# Patient Record
Sex: Female | Born: 1950 | Race: White | Hispanic: No | Smoking: Never smoker
Health system: Southern US, Community
[De-identification: ages and names within clinical notes are randomized; demographics above are authoritative.]

## PROBLEM LIST (undated history)

## (undated) DIAGNOSIS — H269 Unspecified cataract: Secondary | ICD-10-CM

## (undated) DIAGNOSIS — I7121 Aneurysm of the ascending aorta, without rupture: Secondary | ICD-10-CM

## (undated) DIAGNOSIS — K829 Disease of gallbladder, unspecified: Secondary | ICD-10-CM

## (undated) DIAGNOSIS — L650 Telogen effluvium: Secondary | ICD-10-CM

## (undated) DIAGNOSIS — M1990 Unspecified osteoarthritis, unspecified site: Secondary | ICD-10-CM

## (undated) DIAGNOSIS — Q796 Ehlers-Danlos syndrome, unspecified: Secondary | ICD-10-CM

## (undated) DIAGNOSIS — T7840XA Allergy, unspecified, initial encounter: Secondary | ICD-10-CM

## (undated) DIAGNOSIS — M6740 Ganglion, unspecified site: Secondary | ICD-10-CM

## (undated) DIAGNOSIS — E079 Disorder of thyroid, unspecified: Secondary | ICD-10-CM

## (undated) DIAGNOSIS — I720 Aneurysm of carotid artery: Secondary | ICD-10-CM

## (undated) DIAGNOSIS — G4730 Sleep apnea, unspecified: Secondary | ICD-10-CM

## (undated) DIAGNOSIS — M545 Low back pain: Secondary | ICD-10-CM

## (undated) DIAGNOSIS — M810 Age-related osteoporosis without current pathological fracture: Secondary | ICD-10-CM

## (undated) DIAGNOSIS — R001 Bradycardia, unspecified: Secondary | ICD-10-CM

## (undated) DIAGNOSIS — K219 Gastro-esophageal reflux disease without esophagitis: Secondary | ICD-10-CM

## (undated) DIAGNOSIS — K635 Polyp of colon: Secondary | ICD-10-CM

## (undated) DIAGNOSIS — Z780 Asymptomatic menopausal state: Secondary | ICD-10-CM

## (undated) DIAGNOSIS — S73199A Other sprain of unspecified hip, initial encounter: Secondary | ICD-10-CM

## (undated) HISTORY — DX: Gastro-esophageal reflux disease without esophagitis: K21.9

## (undated) HISTORY — PX: NASAL SINUS SURGERY: SHX719

## (undated) HISTORY — DX: Aneurysm of the ascending aorta, without rupture: I71.21

## (undated) HISTORY — DX: Polyp of colon: K63.5

## (undated) HISTORY — DX: Asymptomatic menopausal state: Z78.0

## (undated) HISTORY — DX: Disease of gallbladder, unspecified: K82.9

## (undated) HISTORY — DX: Low back pain: M54.5

## (undated) HISTORY — DX: Allergy, unspecified, initial encounter: T78.40XA

## (undated) HISTORY — DX: Unspecified cataract: H26.9

## (undated) HISTORY — DX: Unspecified osteoarthritis, unspecified site: M19.90

## (undated) HISTORY — DX: Disorder of thyroid, unspecified: E07.9

## (undated) HISTORY — DX: Telogen effluvium: L65.0

## (undated) HISTORY — DX: Bradycardia, unspecified: R00.1

## (undated) HISTORY — PX: JOINT REPLACEMENT: SHX530

## (undated) HISTORY — DX: Age-related osteoporosis without current pathological fracture: M81.0

## (undated) HISTORY — DX: Ehlers-Danlos syndrome, unspecified: Q79.60

## (undated) HISTORY — DX: Aneurysm of carotid artery: I72.0

## (undated) HISTORY — DX: Sleep apnea, unspecified: G47.30

---
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
## 1997-04-24 HISTORY — PX: CYSTECTOMY: SUR359

## 1998-07-19 ENCOUNTER — Other Ambulatory Visit: Admission: RE | Admit: 1998-07-19 | Discharge: 1998-07-19 | Payer: Self-pay | Admitting: Obstetrics and Gynecology

## 1999-01-24 ENCOUNTER — Other Ambulatory Visit: Admission: RE | Admit: 1999-01-24 | Discharge: 1999-01-24 | Payer: Self-pay | Admitting: Obstetrics and Gynecology

## 1999-07-25 ENCOUNTER — Other Ambulatory Visit: Admission: RE | Admit: 1999-07-25 | Discharge: 1999-07-25 | Payer: Self-pay | Admitting: *Deleted

## 2000-02-02 ENCOUNTER — Other Ambulatory Visit: Admission: RE | Admit: 2000-02-02 | Discharge: 2000-02-02 | Payer: Self-pay | Admitting: Obstetrics and Gynecology

## 2000-04-24 HISTORY — PX: BREAST SURGERY: SHX581

## 2000-05-31 ENCOUNTER — Encounter: Admission: RE | Admit: 2000-05-31 | Discharge: 2000-05-31 | Payer: Self-pay | Admitting: Otolaryngology

## 2000-05-31 ENCOUNTER — Encounter: Payer: Self-pay | Admitting: Otolaryngology

## 2000-06-07 ENCOUNTER — Other Ambulatory Visit: Admission: RE | Admit: 2000-06-07 | Discharge: 2000-06-07 | Payer: Self-pay | Admitting: Otolaryngology

## 2000-06-07 ENCOUNTER — Encounter (INDEPENDENT_AMBULATORY_CARE_PROVIDER_SITE_OTHER): Payer: Self-pay | Admitting: Specialist

## 2000-09-10 ENCOUNTER — Other Ambulatory Visit: Admission: RE | Admit: 2000-09-10 | Discharge: 2000-09-10 | Payer: Self-pay | Admitting: Obstetrics and Gynecology

## 2001-01-10 ENCOUNTER — Other Ambulatory Visit: Admission: RE | Admit: 2001-01-10 | Discharge: 2001-01-10 | Payer: Self-pay | Admitting: Radiology

## 2001-01-29 ENCOUNTER — Ambulatory Visit (HOSPITAL_COMMUNITY): Admission: RE | Admit: 2001-01-29 | Discharge: 2001-01-29 | Payer: Self-pay | Admitting: Gastroenterology

## 2001-02-15 ENCOUNTER — Encounter (INDEPENDENT_AMBULATORY_CARE_PROVIDER_SITE_OTHER): Payer: Self-pay | Admitting: *Deleted

## 2001-02-15 ENCOUNTER — Ambulatory Visit (HOSPITAL_BASED_OUTPATIENT_CLINIC_OR_DEPARTMENT_OTHER): Admission: RE | Admit: 2001-02-15 | Discharge: 2001-02-15 | Payer: Self-pay | Admitting: Surgery

## 2002-02-07 ENCOUNTER — Other Ambulatory Visit: Admission: RE | Admit: 2002-02-07 | Discharge: 2002-02-07 | Payer: Self-pay | Admitting: Obstetrics & Gynecology

## 2003-03-10 ENCOUNTER — Other Ambulatory Visit: Admission: RE | Admit: 2003-03-10 | Discharge: 2003-03-10 | Payer: Self-pay | Admitting: Obstetrics & Gynecology

## 2003-05-13 ENCOUNTER — Encounter: Admission: RE | Admit: 2003-05-13 | Discharge: 2003-08-11 | Payer: Self-pay | Admitting: Family Medicine

## 2007-05-08 ENCOUNTER — Other Ambulatory Visit: Admission: RE | Admit: 2007-05-08 | Discharge: 2007-05-08 | Payer: Self-pay | Admitting: Radiology

## 2008-12-21 ENCOUNTER — Encounter: Admission: RE | Admit: 2008-12-21 | Discharge: 2008-12-21 | Payer: Self-pay | Admitting: Family Medicine

## 2009-02-08 ENCOUNTER — Encounter: Admission: RE | Admit: 2009-02-08 | Discharge: 2009-02-08 | Payer: Self-pay | Admitting: Gastroenterology

## 2009-05-26 ENCOUNTER — Encounter: Admission: RE | Admit: 2009-05-26 | Discharge: 2009-05-26 | Payer: Self-pay | Admitting: Gastroenterology

## 2009-06-08 ENCOUNTER — Encounter: Admission: RE | Admit: 2009-06-08 | Discharge: 2009-06-08 | Payer: Self-pay | Admitting: Gastroenterology

## 2009-10-11 ENCOUNTER — Encounter: Admission: RE | Admit: 2009-10-11 | Discharge: 2010-01-06 | Payer: Self-pay | Admitting: Sports Medicine

## 2010-05-15 ENCOUNTER — Encounter: Payer: Self-pay | Admitting: Obstetrics and Gynecology

## 2010-05-15 ENCOUNTER — Encounter: Payer: Self-pay | Admitting: Gastroenterology

## 2010-05-16 ENCOUNTER — Encounter: Payer: Self-pay | Admitting: Family Medicine

## 2010-05-16 ENCOUNTER — Encounter: Payer: Self-pay | Admitting: Gastroenterology

## 2010-09-09 NOTE — Op Note (Signed)
[place]. [facility]  Patient:    Patricia Conley, Patricia Conley Visit Number: [id number] MRN: [id number]          Service Type: DSU Location: [facility] Attending Physician:  Charlton Haws Dictated by:   Currie Paris, M.D. Proc. Date: 02/15/01 Admit Date:  02/15/2001 Discharge Date: 02/15/2001   CC:         Jeralyn Ruths, M.D.  Sherry A. Rosalio Macadamia, M.D.   Operative Report  VISIT:  [id number]  OFFICE MEDICAL RECORD NUMBER:  [id number]  PREOPERATIVE DIAGNOSIS:  Left breast mass.  POSTOPERATIVE DIAGNOSIS:  Left breast mass.  OPERATION:  Removal of left breast mass.  SURGEON:  Currie Paris, M.D.  ANESTHESIA:  MAC.  CLINICAL HISTORY:  This patient has presented with a palpable left breast mass, and an FNA showed scant cells, although clinically it was benign.  We felt this was inadequate for a full diagnosis, and she desired to have this removed.  DESCRIPTION OF PROCEDURE:  The patient was seen in the holding area.  He had no further questions.  In the operating room, the mass was identified and marked.  She was then given IV sedation.  The breast was prepped and draped. 1% Xylocaine was used for local and infiltrated over the mass.  A curvilinear incision was made just below the mass, trying to keep the scar a little closer to the nipple areolar complex. With some palpation and manipulation, I was able to pull the mass down and then using cutting current of the cautery began to divide some of the fatty tissue above it.  I put a suture in it for traction and then came completely around the mass, excising it in toto.  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  The incision was checked for hemostasis; when everything was dry, closed in layers with 3-0 Vicryl, followed by 4-0 Monocryl subcuticular plus Steri-Strips.  The patient tolerated the procedure well.  There were no operative complications.  All counts were correct at the end of the  case. Dictated by:   Currie Paris, M.D. Attending Physician:  Charlton Haws DD:  02/15/01 TD:  02/18/01 Job: [id number]

## 2010-11-10 ENCOUNTER — Other Ambulatory Visit: Payer: Self-pay | Admitting: Otolaryngology

## 2010-11-10 ENCOUNTER — Ambulatory Visit: Payer: Self-pay | Admitting: Internal Medicine

## 2010-11-10 DIAGNOSIS — M542 Cervicalgia: Secondary | ICD-10-CM

## 2010-11-11 ENCOUNTER — Ambulatory Visit
Admission: RE | Admit: 2010-11-11 | Discharge: 2010-11-11 | Disposition: A | Discharge status: Home / Self Care | Payer: BC Managed Care – PPO | Source: Ambulatory Visit | Attending: Otolaryngology | Admitting: Otolaryngology

## 2010-11-11 DIAGNOSIS — M542 Cervicalgia: Secondary | ICD-10-CM

## 2010-11-11 MED ORDER — IOHEXOL 300 MG/ML  SOLN
75.0000 mL | Freq: Once | INTRAMUSCULAR | Status: AC | PRN
  Administered 2010-11-11: 75 mL via INTRAVENOUS

## 2010-11-16 ENCOUNTER — Ambulatory Visit (INDEPENDENT_AMBULATORY_CARE_PROVIDER_SITE_OTHER): Payer: BC Managed Care – PPO | Admitting: Internal Medicine

## 2010-11-16 ENCOUNTER — Encounter: Payer: Self-pay | Admitting: Internal Medicine

## 2010-11-16 ENCOUNTER — Other Ambulatory Visit: Payer: Self-pay | Admitting: Internal Medicine

## 2010-11-16 VITALS — BP 110/70 | HR 76 | Temp 97.2°F | Ht 66.5 in | Wt 139.0 lb

## 2010-11-16 DIAGNOSIS — R9389 Abnormal findings on diagnostic imaging of other specified body structures: Secondary | ICD-10-CM

## 2010-11-16 DIAGNOSIS — K829 Disease of gallbladder, unspecified: Secondary | ICD-10-CM | POA: Insufficient documentation

## 2010-11-16 DIAGNOSIS — K644 Residual hemorrhoidal skin tags: Secondary | ICD-10-CM

## 2010-11-16 DIAGNOSIS — N6009 Solitary cyst of unspecified breast: Secondary | ICD-10-CM | POA: Insufficient documentation

## 2010-11-16 DIAGNOSIS — M899 Disorder of bone, unspecified: Secondary | ICD-10-CM

## 2010-11-16 DIAGNOSIS — N951 Menopausal and female climacteric states: Secondary | ICD-10-CM

## 2010-11-16 DIAGNOSIS — R61 Generalized hyperhidrosis: Secondary | ICD-10-CM

## 2010-11-16 DIAGNOSIS — Z780 Asymptomatic menopausal state: Secondary | ICD-10-CM | POA: Insufficient documentation

## 2010-11-16 DIAGNOSIS — M8580 Other specified disorders of bone density and structure, unspecified site: Secondary | ICD-10-CM | POA: Insufficient documentation

## 2010-11-16 LAB — CBC WITH DIFFERENTIAL/PLATELET
Eosinophils Absolute: 0.1 10*3/uL (ref 0.0–0.7)
HCT: 40.1 % (ref 36.0–46.0)
Hemoglobin: 13.1 g/dL (ref 12.0–15.0)
Lymphocytes Relative: 31 % (ref 12–46)
Lymphs Abs: 1.7 10*3/uL (ref 0.7–4.0)
MCH: 29.1 pg (ref 26.0–34.0)
Monocytes Absolute: 0.5 10*3/uL (ref 0.1–1.0)
Neutro Abs: 3.2 10*3/uL (ref 1.7–7.7)

## 2010-11-16 LAB — COMPREHENSIVE METABOLIC PANEL
ALT: 18 U/L (ref 0–35)
AST: 22 U/L (ref 0–37)
Albumin: 4.8 g/dL (ref 3.5–5.2)
Alkaline Phosphatase: 66 U/L (ref 39–117)
BUN: 14 mg/dL (ref 6–23)
Calcium: 9.5 mg/dL (ref 8.4–10.5)
Chloride: 102 mEq/L (ref 96–112)
Total Bilirubin: 0.9 mg/dL (ref 0.3–1.2)
Total Protein: 6.9 g/dL (ref 6.0–8.3)

## 2010-11-16 LAB — TSH: TSH: 1.275 u[IU]/mL (ref 0.350–4.500)

## 2010-11-16 LAB — VITAMIN B12: Vitamin B-12: 827 pg/mL (ref 211–911)

## 2010-11-16 NOTE — Progress Notes (Signed)
Subjective:    Patient ID: Patricia Conley, female    DOB: [birth year], 60 y.o.   MRN: [id number]  HPI  New pt here for first visit.  I spent 45 minutes with this pt.  No primary care except [facility].  Concerned over hemorrhoid she can feel.  No bleeding no pain.   Also has questions about Premarin she wass taking for hot sweats prscribed by Dr. Hal Hope.  She has not had any Progesterone in the last month.  She is not sure Premarin is helping the sweating issue  Has been evaluated in past for a muscle twitching problem with hyper-reflexia.  Evaluated at [facility] but not sure of diagnosis.  Tx with Celexa and all symtoms resolved.   She is off Celexa now  Also been told by Dr. Kinnie Scales that she had a "spot on Kidney"  Evaluated by Dr. Patsi Sears and a urologist at [facility].  She reports that latest U/S showed no spot on her kidney.  She also believes she has a GB dysfunction  She report her daughter came home from [place] recently and had some sort of virus.  She is wondering if the lymph nodes in her neck are swollen.  She had Recent ENT eval by Dr. Lazarus Salines  Appetitie OK no recent wt loss, occ sweating, No N/V?D no GI bleed or dark stools  No Known Allergies Past Medical History  Diagnosis Date  . Menopause   . Osteopenia   . Benign breast cyst in female     L side s/p bx  . Gall bladder disease     per pt report   Past Surgical History  Procedure Date  . Breast surgery 2002    cyst  . Nasal sinus surgery 1995, 2002   History   Social History  . Marital Status: Single    Spouse Name: N/A    Number of Children: N/A  . Years of Education: N/A   Occupational History  . Not on file.   Social History Main Topics  . Smoking status: Never Smoker   . Smokeless tobacco: Never Used  . Alcohol Use: 1.0 oz/week    2 drink(s) per week  . Drug Use: No  . Sexually Active: Not Currently   Other Topics Concern  . Not on file   Social History Narrative    . No narrative on file   Family History  Problem Relation Age of Onset  . Stroke Mother   . Nephrolithiasis Mother   . Diabetes Mother   . Aneurysm Mother     x2  . Cancer Mother     lymphoma, breast cancer  . Depression Father   . Parkinsonism Father   . Drug abuse Father   . Mental illness Sister     paranoia   Patient Active Problem List  Diagnoses  . Benign breast cyst in female  . Menopause  . Osteopenia  . Gall bladder disease       Review of Systems  See HPI     Objective:   Physical Exam  Constitutional: She appears well-developed and well-nourished.  Neck: No thyromegaly present.  Cardiovascular: Normal heart sounds.  Exam reveals no gallop and no friction rub.   No murmur heard. Pulmonary/Chest: Breath sounds normal. She has no wheezes. She has no rales. She exhibits no tenderness.  Genitourinary:       Hemorrhoidal tag externally  Lymphadenopathy:       Right cervical: No superficial cervical  and no posterior cervical adenopathy present.      Left cervical: No superficial cervical and no posterior cervical adenopathy present.       Right axillary: No pectoral and no lateral adenopathy present.       Left axillary: No pectoral and no lateral adenopathy present.      Right: No inguinal adenopathy present.       Left: No inguinal adenopathy present.  Skin: No rash noted.  Psychiatric: Her speech is normal and behavior is normal. Her mood appears anxious.          Assessment & Plan:  1)  Hemorrhoid  Will refer to Gen surgery 2)  I do not detect lymphadenopathy on exam  Will need Dr. Milta Deiters records 3)  Sweating.  Not clearly menopause related advised to come off premarin.  Ok to take 10 day course of Provera.  May need endometiral U/S  Given infor on hormone therapy.  Check labs today  4) Osteopenia 5)  Cystic Breast disease 6)  ????Kidney abnormal X-ray  Will get Dr. Wyatt Portela records

## 2010-11-16 NOTE — Patient Instructions (Signed)
Ok to taper off Premarin  Schedule CPE

## 2010-11-17 ENCOUNTER — Encounter: Payer: Self-pay | Admitting: Emergency Medicine

## 2010-11-22 ENCOUNTER — Ambulatory Visit: Payer: BC Managed Care – PPO | Admitting: Internal Medicine

## 2010-12-05 ENCOUNTER — Ambulatory Visit: Payer: BC Managed Care – PPO | Admitting: Internal Medicine

## 2010-12-09 ENCOUNTER — Encounter (INDEPENDENT_AMBULATORY_CARE_PROVIDER_SITE_OTHER): Payer: Self-pay | Admitting: General Surgery

## 2010-12-12 ENCOUNTER — Encounter (INDEPENDENT_AMBULATORY_CARE_PROVIDER_SITE_OTHER): Payer: BC Managed Care – PPO | Admitting: General Surgery

## 2010-12-15 ENCOUNTER — Encounter: Payer: Self-pay | Admitting: Internal Medicine

## 2010-12-15 ENCOUNTER — Ambulatory Visit (INDEPENDENT_AMBULATORY_CARE_PROVIDER_SITE_OTHER): Payer: BC Managed Care – PPO | Admitting: Internal Medicine

## 2010-12-15 VITALS — BP 110/84 | HR 74 | Temp 97.7°F | Resp 14 | Ht 66.5 in | Wt 138.0 lb

## 2010-12-15 DIAGNOSIS — N950 Postmenopausal bleeding: Secondary | ICD-10-CM

## 2010-12-15 DIAGNOSIS — D3500 Benign neoplasm of unspecified adrenal gland: Secondary | ICD-10-CM

## 2010-12-15 DIAGNOSIS — E785 Hyperlipidemia, unspecified: Secondary | ICD-10-CM

## 2010-12-15 DIAGNOSIS — Z1272 Encounter for screening for malignant neoplasm of vagina: Secondary | ICD-10-CM

## 2010-12-15 DIAGNOSIS — M899 Disorder of bone, unspecified: Secondary | ICD-10-CM

## 2010-12-15 DIAGNOSIS — S40029A Contusion of unspecified upper arm, initial encounter: Secondary | ICD-10-CM

## 2010-12-15 DIAGNOSIS — D1803 Hemangioma of intra-abdominal structures: Secondary | ICD-10-CM

## 2010-12-15 DIAGNOSIS — Z01419 Encounter for gynecological examination (general) (routine) without abnormal findings: Secondary | ICD-10-CM

## 2010-12-15 DIAGNOSIS — M949 Disorder of cartilage, unspecified: Secondary | ICD-10-CM

## 2010-12-15 DIAGNOSIS — Q619 Cystic kidney disease, unspecified: Secondary | ICD-10-CM

## 2010-12-15 DIAGNOSIS — M25559 Pain in unspecified hip: Secondary | ICD-10-CM

## 2010-12-15 DIAGNOSIS — N281 Cyst of kidney, acquired: Secondary | ICD-10-CM

## 2010-12-15 DIAGNOSIS — M8580 Other specified disorders of bone density and structure, unspecified site: Secondary | ICD-10-CM

## 2010-12-15 DIAGNOSIS — Z113 Encounter for screening for infections with a predominantly sexual mode of transmission: Secondary | ICD-10-CM

## 2010-12-15 DIAGNOSIS — M25552 Pain in left hip: Secondary | ICD-10-CM

## 2010-12-15 LAB — POCT URINALYSIS DIPSTICK
Bilirubin, UA: NEGATIVE
Glucose, UA: NEGATIVE
Ketones, UA: NEGATIVE
Protein, UA: NEGATIVE

## 2010-12-15 LAB — LIPID PANEL
Total CHOL/HDL Ratio: 2 Ratio
Triglycerides: 74 mg/dL (ref <150)

## 2010-12-15 NOTE — Progress Notes (Signed)
Subjective:    Patient ID: Patricia Conley, female    DOB: 01/04/1951, 60 y.o.   MRN: [id number]  HPI  Patricia Conley is here for CPE.  She his stressed because her daughter has an undiagnosed illness that involves her lymph nodes.  Patricia Conley is tearful when speaking about this during interiew.    She also has a recurrence of her L hip pain .  She describes achy pain in L hip and inguinal area that has been treated by an orhtopedist in the past and she has an appt. This afternoon  Also concerned about small bruises that she sees on inside of arms and legs.  NO bleeding from gums  She is UTD on her mammogram, last bone density showed osteopenia,  And she is UTD on colonoscopy with Dr. Madilyn Fireman.  She reports about 5-6 month of unopposed estrogen.  I had given her a Provera withdrawal trial and pt. Reports she had no menstrual bleeding.    Review of Systems No chest pain, no SOB,  No LE Edema.    Objective:   Physical Exam Physical Exam  Vital signs and nursing note reviewed  Constitutional: She is oriented to person, place, and time. She appears well-developed and well-nourished. She is cooperative.  HENT:  Head: Normocephalic and atraumatic.  Right Ear: Tympanic membrane normal.  Left Ear: Tympanic membrane normal.  Nose: Nose normal.  Mouth/Throat: Oropharynx is clear and moist and mucous membranes are normal. No oropharyngeal exudate or posterior oropharyngeal erythema.  Eyes: Conjunctivae and EOM are normal. Pupils are equal, round, and reactive to light.  Neck: Neck supple. No JVD present. Carotid bruit is not present. No mass and no thyromegaly present.  Cardiovascular: Regular rhythm, normal heart sounds, intact distal pulses and normal pulses.  Exam reveals no gallop and no friction rub.   No murmur heard. Pulses:      Dorsalis pedis pulses are 2+ on the right side, and 2+ on the left side.  Pulmonary/Chest: Breath sounds normal. She has no wheezes. She has no rhonchi. She has no rales.  Right breast exhibits no mass, no nipple discharge and no skin change. Left breast exhibits no mass, no nipple discharge and no skin change.  Abdominal: Soft. Bowel sounds are normal. She exhibits no distension and no mass. There is no hepatosplenomegaly. There is no tenderness. There is no CVA tenderness.  Genitourinary: Rectum normal, vagina normal and uterus normal. Rectal exam shows no mass. Guaiac negative stool. No labial fusion. There is no lesion on the right labia. There is no lesion on the left labia. Cervix exhibits no motion tenderness. Right adnexum displays no mass, no tenderness and no fullness. Left adnexum displays no mass, no tenderness and no fullness. No erythema around the vagina. Guaiac neg  Musculoskeletal:       No active synovitis to any joint.  L hip some pain with external rotation.  No SI joint pain     Lymphadenopathy:       Right cervical: No superficial cervical adenopathy present.      Left cervical: No superficial cervical adenopathy present.       Right axillary: No pectoral and no lateral adenopathy present.       Left axillary: No pectoral and no lateral adenopathy present.      Right: No inguinal adenopathy present.       Left: No inguinal adenopathy present.  Neurological: She is alert and oriented to person, place, and time. She has normal strength and  normal reflexes. No cranial nerve deficit or sensory deficit. She displays a negative Romberg sign. Coordination and gait normal.  Skin: Skin is warm and dry. No abrasion, no bruising, and no rash noted. No cyanosis. Nails show no clubbing.   Mm sized bruise medial aspect of R upper arm Psychiatric: She has a normal mood and affect. Her speech is normal and behavior is normal.          Assessment & Plan:  1)  HM  See scanned hm sheet.  Pt is to call dR. Madilyn Fireman to see when she is due for a repeat colonoscopy 2)  L HIp pain  OK to reafer to [facility]ul[facility] Foster after she sees her orthopedist 3)  Ecchymosis  Clinically  very minor  Will check PT and PTT 4)  L renal cyst :  Followed by Dr. Patsi Sears who she saw spring of 2010 for repeat u/S that pt states was done in office.  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

## 2010-12-15 NOTE — Patient Instructions (Signed)
Will set up physical therapy referral.  See orthopedist about L hip and follow recommendation when to start PT  Labs will be mailed to you.  Call in 10 days if you have not received  Call as needed

## 2010-12-16 LAB — PROTIME-INR
INR: 0.89 (ref <1.50)
Prothrombin Time: 12.4 seconds (ref 11.6–15.2)

## 2010-12-16 LAB — APTT: aPTT: 29 seconds (ref 24–37)

## 2010-12-20 ENCOUNTER — Encounter: Payer: Self-pay | Admitting: Emergency Medicine

## 2010-12-22 ENCOUNTER — Encounter: Payer: Self-pay | Admitting: Emergency Medicine

## 2011-01-03 ENCOUNTER — Encounter: Payer: Self-pay | Admitting: Internal Medicine

## 2011-01-04 ENCOUNTER — Encounter: Payer: Self-pay | Admitting: Internal Medicine

## 2011-01-04 ENCOUNTER — Ambulatory Visit (INDEPENDENT_AMBULATORY_CARE_PROVIDER_SITE_OTHER): Payer: BC Managed Care – PPO | Admitting: Internal Medicine

## 2011-01-04 VITALS — BP 124/80 | HR 76 | Temp 97.2°F | Wt 137.0 lb

## 2011-01-04 DIAGNOSIS — M25559 Pain in unspecified hip: Secondary | ICD-10-CM

## 2011-01-04 DIAGNOSIS — K219 Gastro-esophageal reflux disease without esophagitis: Secondary | ICD-10-CM

## 2011-01-04 DIAGNOSIS — R1310 Dysphagia, unspecified: Secondary | ICD-10-CM

## 2011-01-04 MED ORDER — OMEPRAZOLE-SODIUM BICARBONATE 40-1100 MG PO CAPS: 1.0000 | ORAL_CAPSULE | Freq: Every day | ORAL | Status: DC

## 2011-01-04 NOTE — Patient Instructions (Signed)
Take prescribed medicine daily for 2 months  Will follow here if not better and pt to make appt with Dr. Jennye Boroughs office

## 2011-01-04 NOTE — Progress Notes (Signed)
Subjective:    Patient ID: Patricia Conley, female    DOB: 1951/01/23, 60 y.o.   MRN: [id number]  HPICarol is here for an acute visit.  She has had 3 weeks of burning type upper chest and throat pain.  States at times she can feel the "acid back up into my throat"  Eating Tums without relief.  No change in color or dark stools.  No V/D occasional nausea.  Has been trying to eat less spicy foods.  She does report that at her last EGD approx 21 year ago Patricia Conley mentitoned she may a warty leasion in her esophagus.  Occasionally she feels as if she has a hard time swallowing.  She had been taking some intermittant Celebrex for her L hip pain.  She has been referred to a specialist by her orthopedic MD as she has a L torn Labrum  She is going fo her TVUS soon.  She had to reschedule appt.  Daughter is doing better so some less stresss she reports  No Known Allergies Past Medical History  Diagnosis Date  . Menopause   . Osteopenia   . Benign breast cyst in female     L side s/p bx  . Gall bladder disease     per pt report  . GERD (gastroesophageal reflux disease)    Past Surgical History  Procedure Date  . Breast surgery 2002    cyst  . Nasal sinus surgery 1995, 2002   History   Social History  . Marital Status: Single    Spouse Name: Patricia Conley    Number of Children: Patricia Conley  . Years of Education: Patricia Conley   Occupational History  . Not on file.   Social History Main Topics  . Smoking status: Never Smoker   . Smokeless tobacco: Never Used  . Alcohol Use: 1.0 oz/week    2 drink(s) per week  . Drug Use: No  . Sexually Active: Not Currently   Other Topics Concern  . Not on file   Social History Narrative  . No narrative on file   Family History  Problem Relation Age of Onset  . Stroke Mother   . Nephrolithiasis Mother   . Diabetes Mother   . Aneurysm Mother     x2  . Cancer Mother     lymphoma, breast cancer  . Depression Father   . Parkinsonism Father   . Drug abuse Father     . Mental illness Sister     paranoia   Patient Active Problem List  Diagnoses  . Benign breast cyst in female  . Menopause  . Osteopenia  . Gall bladder disease  . Renal cyst  . Adrenal nodule  . Liver hemangioma   Current Outpatient Prescriptions on File Prior to Visit  Medication Sig Dispense Refill  . b complex vitamins capsule Take 1 capsule by mouth daily.        Marland Kitchen estrogens, conjugated, (PREMARIN) 0.3 MG tablet Take 0.3 mg by mouth daily. Take daily for 21 days then do not take for 7 days.              Review of Systems See HPI.  No chest or epigastric pain.  No SOB    Objective:   Physical Exam Physical Exam  Nursing note and vitals reviewed.  Constitutional: She is oriented to person, place, and time. She appears well-developed and well-nourished.  HENT:  Head: Normocephalic and atraumatic.  Cardiovascular: Normal rate and regular rhythm. Exam  reveals no gallop and no friction rub.  No murmur heard.  Pulmonary/Chest: Breath sounds normal. She has no wheezes. She has no rales. ABD.  BS +  NO HSM.  No tenderness is episgastrium or in any other quadrant.  No peritoneal signs  Neurological: She is alert and oriented to person, place, and time.  Skin: Skin is warm and dry.  Psychiatric: She has a normal mood and affect. Her behavior is normal.       Assessment & Plan:  1)  Dyspepsia most likey GERD.  Will try 8 week sof Zegerid 40 mg.  Avoid spicy foods. 2)  ??? Esophageal lesion:  Pt advised to follow up with Patricial Medoff esp. If difficulty swallowing continues.  She voices understanding and will make appt with his office.  She is to return here if no improvement

## 2011-01-10 ENCOUNTER — Other Ambulatory Visit (HOSPITAL_BASED_OUTPATIENT_CLINIC_OR_DEPARTMENT_OTHER): Payer: BC Managed Care – PPO

## 2011-01-12 ENCOUNTER — Ambulatory Visit (INDEPENDENT_AMBULATORY_CARE_PROVIDER_SITE_OTHER)
Admission: RE | Admit: 2011-01-12 | Discharge: 2011-01-12 | Disposition: A | Discharge status: Home / Self Care | Payer: BC Managed Care – PPO | Source: Ambulatory Visit | Attending: Internal Medicine | Admitting: Internal Medicine

## 2011-01-12 ENCOUNTER — Ambulatory Visit (HOSPITAL_BASED_OUTPATIENT_CLINIC_OR_DEPARTMENT_OTHER)
Admission: RE | Admit: 2011-01-12 | Discharge: 2011-01-12 | Disposition: A | Discharge status: Home / Self Care | Payer: BC Managed Care – PPO | Source: Ambulatory Visit | Attending: Internal Medicine | Admitting: Internal Medicine

## 2011-01-12 DIAGNOSIS — N950 Postmenopausal bleeding: Secondary | ICD-10-CM

## 2011-01-18 ENCOUNTER — Telehealth: Payer: Self-pay | Admitting: Emergency Medicine

## 2011-01-18 NOTE — Telephone Encounter (Signed)
Patricia Conley saw Dr. Tora Duck with [facility] yesterday about her hip pain.  She states he talked about doing a hip replacement.  She said she was not prepared to think about that and would like to discuss with DDS and possibly get a second opinion.  Would like to know if DDS would call her to discuss?  Not sure where she needs to go or what to do? Best contact number is cell phone [phone]

## 2011-02-06 ENCOUNTER — Encounter: Payer: Self-pay | Admitting: Internal Medicine

## 2011-02-08 NOTE — Telephone Encounter (Signed)
I have attempted to call pts severtal times and have left messages on her cell phone

## 2011-02-15 ENCOUNTER — Telehealth: Payer: Self-pay | Admitting: Internal Medicine

## 2011-02-15 NOTE — Telephone Encounter (Signed)
Spoke with Patricia Conley Tuesday 02/14/2011.  She wishes second opinion regarding Dr. Tana Felts recommendation for hip replacment.  OK to refer to Dr. Lamar Sprinkles at [facility]

## 2011-03-14 DIAGNOSIS — M169 Osteoarthritis of hip, unspecified: Secondary | ICD-10-CM | POA: Insufficient documentation

## 2011-04-07 ENCOUNTER — Encounter: Payer: Self-pay | Admitting: Internal Medicine

## 2011-07-18 ENCOUNTER — Encounter (INDEPENDENT_AMBULATORY_CARE_PROVIDER_SITE_OTHER): Payer: BC Managed Care – PPO | Admitting: General Surgery

## 2011-08-18 ENCOUNTER — Encounter (INDEPENDENT_AMBULATORY_CARE_PROVIDER_SITE_OTHER): Payer: Self-pay | Admitting: General Surgery

## 2011-08-18 ENCOUNTER — Ambulatory Visit (INDEPENDENT_AMBULATORY_CARE_PROVIDER_SITE_OTHER): Payer: BC Managed Care – PPO | Admitting: General Surgery

## 2011-08-18 VITALS — BP 112/80 | HR 72 | Temp 98.3°F | Resp 18 | Ht 67.0 in | Wt 135.2 lb

## 2011-08-18 DIAGNOSIS — K644 Residual hemorrhoidal skin tags: Secondary | ICD-10-CM

## 2011-08-18 NOTE — Progress Notes (Signed)
Patient ID: Patricia Conley, female   DOB: Sep 08, 1950, 61 y.o.   MRN: [id number]  Chief Complaint  Patient presents with  . Hemorrhoids    recheck    HPI Patricia Conley is a 61 y.o. female.   HPI She is referred by Dr. Constance Goltz for evaluation of hemorrhoids. She states she is felt an area that appears to be protruding out from her anus for a number of years. Intermittently, it is uncomfortable and itches a little bit. No bleeding or pain with bowel movements. She does not have any constipation. Her last colonoscopy was 10-12 years ago.  Past Medical History  Diagnosis Date  . Menopause   . Osteopenia   . Benign breast cyst in female     L side s/p bx  . Gall bladder disease     per pt report  . GERD (gastroesophageal reflux disease)   . Hemorrhoids     Past Surgical History  Procedure Date  . Nasal sinus surgery 1995, 2002  . Breast surgery 2002    cyst on Left breast  . Cystectomy 1999    Right elbow    Family History  Problem Relation Age of Onset  . Stroke Mother   . Nephrolithiasis Mother   . Diabetes Mother   . Aneurysm Mother     x2  . Cancer Mother     lymphoma, breast cancer  . Depression Father   . Parkinsonism Father   . Drug abuse Father   . Mental illness Sister     paranoia    Social History History  Substance Use Topics  . Smoking status: Never Smoker   . Smokeless tobacco: Never Used  . Alcohol Use: 1.0 oz/week    2 drink(s) per week    No Known Allergies  Current Outpatient Prescriptions  Medication Sig Dispense Refill  . b complex vitamins capsule Take 1 capsule by mouth daily.        . Glucos-MSM-C-Mn-Ginger-Willow (BL MSM GLUCOSAMINE COMPLEX PO) Take by mouth daily.        Review of Systems Review of Systems  Constitutional: Negative.   Gastrointestinal: Positive for rectal pain. Negative for abdominal pain, blood in stool and anal bleeding.  Musculoskeletal: Positive for arthralgias (left hip).    Blood pressure 112/80,  pulse 72, temperature 98.3 F (36.8 C), temperature source Temporal, resp. rate 18, height 5\' 7"  (1.702 m), weight 135 lb 3.2 oz (61.326 kg).  Physical Exam Physical Exam  Constitutional: She appears well-developed and well-nourished. No distress.  Abdominal: Soft. She exhibits no distension and no mass. There is no tenderness.       Subumbilical scar.  Genitourinary:       External hemorrhoid in the right anterior position.  No suspicious perianal skin lesions.  No anal fissure.  On DRE, there is no mass or blood.  Anoscopy-no masses or blood;  Small to moderate size right anterior internal hemorroid.    Data Reviewed None  Assessment    Minimally symptomatic right anterior external hemorrhoid.  Asx right internal hemorrhoid.    Plan    Avoid constipation.  Use TUCKS pads prn.  Call Dr. Kinnie Scales re needing a screening colonoscopy.  Return is sxs worsen.       Fedra Lanter J 08/18/2011, 10:06 AM

## 2011-08-18 NOTE — Patient Instructions (Signed)
Avoid constipation 

## 2011-08-31 ENCOUNTER — Encounter (HOSPITAL_COMMUNITY): Payer: Self-pay | Admitting: Pharmacy Technician

## 2011-09-05 ENCOUNTER — Encounter: Payer: Self-pay | Admitting: Internal Medicine

## 2011-09-05 ENCOUNTER — Ambulatory Visit (INDEPENDENT_AMBULATORY_CARE_PROVIDER_SITE_OTHER): Payer: BC Managed Care – PPO | Admitting: Internal Medicine

## 2011-09-05 VITALS — BP 110/70 | HR 56 | Temp 98.3°F | Ht 66.5 in | Wt 133.8 lb

## 2011-09-05 DIAGNOSIS — J329 Chronic sinusitis, unspecified: Secondary | ICD-10-CM

## 2011-09-05 DIAGNOSIS — Z01818 Encounter for other preprocedural examination: Secondary | ICD-10-CM

## 2011-09-05 LAB — COMPREHENSIVE METABOLIC PANEL
Alkaline Phosphatase: 64 U/L (ref 39–117)
BUN: 9 mg/dL (ref 6–23)
Glucose, Bld: 79 mg/dL (ref 70–99)
Sodium: 142 mEq/L (ref 135–145)
Total Bilirubin: 0.8 mg/dL (ref 0.3–1.2)
Total Protein: 7.1 g/dL (ref 6.0–8.3)

## 2011-09-05 LAB — CBC WITH DIFFERENTIAL/PLATELET
Basophils Absolute: 0 10*3/uL (ref 0.0–0.1)
Basophils Relative: 0 % (ref 0–1)
Eosinophils Absolute: 0.1 10*3/uL (ref 0.0–0.7)
Hemoglobin: 13.6 g/dL (ref 12.0–15.0)
MCH: 28.9 pg (ref 26.0–34.0)
MCHC: 32.5 g/dL (ref 30.0–36.0)
Monocytes Relative: 6 % (ref 3–12)
Neutrophils Relative %: 66 % (ref 43–77)
RDW: 12.8 % (ref 11.5–15.5)

## 2011-09-05 LAB — LIPID PANEL
HDL: 60 mg/dL (ref >39)
LDL Cholesterol: 60 mg/dL (ref 0–99)
Triglycerides: 67 mg/dL (ref <150)
VLDL: 13 mg/dL (ref 0–40)

## 2011-09-05 MED ORDER — AZITHROMYCIN 250 MG PO TABS
ORAL_TABLET | ORAL | Status: AC
Start: 2011-09-05 — End: 2011-09-10

## 2011-09-05 NOTE — Progress Notes (Signed)
Here to get medical clearance to have hip surgey. Also c/o may have sinus infection, had bad headache yesterday in frontal area, has bad headache/pressure today also with no relief with excedrin

## 2011-09-05 NOTE — Progress Notes (Signed)
Subjective:    Patient ID: Patricia Conley, female    DOB: 1951-01-20, 61 y.o.   MRN: [id number]  HPI  Patricia Conley is here for pre-op clearance for upcoming hip replacement by Dr. Charlann Boxer.  She is doing well except for sinus congestion and headache that she believes may be related to sinuses.   She has had previous sinus surgery by Dr. Lazarus Salines.  She has nasal steroid spray but not been using this  No Known Allergies Past Medical History  Diagnosis Date  . Menopause   . Osteopenia   . Benign breast cyst in female     L side s/p bx  . Gall bladder disease     per pt report  . GERD (gastroesophageal reflux disease)   . Hemorrhoids    Past Surgical History  Procedure Date  . Nasal sinus surgery 1995, 2002  . Breast surgery 2002    cyst on Left breast  . Cystectomy 1999    Right elbow   History   Social History  . Marital Status: Single    Spouse Name: N/A    Number of Children: N/A  . Years of Education: N/A   Occupational History  . Not on file.   Social History Main Topics  . Smoking status: Never Smoker   . Smokeless tobacco: Never Used  . Alcohol Use: 1.0 oz/week    2 drink(s) per week  . Drug Use: No  . Sexually Active: Not Currently   Other Topics Concern  . Not on file   Social History Narrative  . No narrative on file   Family History  Problem Relation Age of Onset  . Stroke Mother   . Nephrolithiasis Mother   . Diabetes Mother   . Aneurysm Mother     x2  . Cancer Mother     lymphoma, breast cancer  . Depression Father   . Parkinsonism Father   . Drug abuse Father   . Mental illness Sister     paranoia   Patient Active Problem List  Diagnoses  . Benign breast cyst in female  . Menopause  . Osteopenia  . Gall bladder disease  . Renal cyst  . Adrenal nodule  . Liver hemangioma  . Hemorrhoids, external without complications   Current Outpatient Prescriptions on File Prior to Visit  Medication Sig Dispense Refill  . b complex vitamins capsule  Take 1 capsule by mouth daily.        . celecoxib (CELEBREX) 100 MG capsule Take 100 mg by mouth daily.       . Glucos-MSM-C-Mn-Ginger-Willow (BL MSM GLUCOSAMINE COMPLEX PO) Take by mouth daily.      . Multiple Vitamin (MULITIVITAMIN WITH MINERALS) TABS Take 1 tablet by mouth daily with breakfast.            Review of Systems    see HPI Objective:   Physical Exam  Physical Exam  Constitutional: She is oriented to person, place, and time. She appears well-developed and well-nourished. She is cooperative.  HENT:  Head: Normocephalic and atraumatic.  Right Ear: A middle ear effusion is present.  Left Ear: A middle ear effusion is present.  Nose: Mucosal edema present. Right sinus exhibits maxillary sinus tenderness. Left sinus exhibits maxillary sinus tenderness.  Mouth/Throat: Posterior oropharyngeal erythema present.  Serous effusion bilaterally  Eyes: Conjunctivae and EOM are normal. Pupils are equal, round, and reactive to light.  Neck: Neck supple. Carotid bruit is not present. No mass present.  Cardiovascular: Regular  rhythm, normal heart sounds, intact distal pulses and normal pulses. Exam reveals no gallop and no friction rub.  No murmur heard.  Pulmonary/Chest: Breath sounds normal. She has no wheezes. She has no rhonchi. She has no rales.  Neurological: She is alert and oriented to person, place, and time.  Skin: Skin is warm and dry. No abrasion, no bruising, no ecchymosis and no rash noted. No cyanosis. Nails show no clubbing.  Psychiatric: She has a normal mood and affect. Her speech is normal and behavior is normal.  Ext no edema        Assessment & Plan:  1)  Sinusitis   z-pak   Nasal steroid ok for 5-7 days 2)  Pre-op clearance  Had recent EKG  Will get labs today  Once sinusitis treated OK for pre-op clearance  Advised no asa or nsaid products one week prior to surgery

## 2011-09-06 ENCOUNTER — Telehealth: Payer: Self-pay | Admitting: *Deleted

## 2011-09-06 NOTE — Telephone Encounter (Signed)
Copy of labs mailed to pt's home address and copy faxed to Dr Charlann Boxer ortho.

## 2011-09-06 NOTE — Progress Notes (Signed)
H&P performed 09/06/11 Dictation # [id number]

## 2011-09-07 ENCOUNTER — Encounter (HOSPITAL_COMMUNITY): Payer: Self-pay

## 2011-09-07 ENCOUNTER — Encounter (HOSPITAL_COMMUNITY)
Admission: RE | Admit: 2011-09-07 | Discharge: 2011-09-07 | Disposition: A | Discharge status: Home / Self Care | Payer: BC Managed Care – PPO | Source: Ambulatory Visit | Attending: Orthopedic Surgery | Admitting: Orthopedic Surgery

## 2011-09-07 HISTORY — DX: Unspecified osteoarthritis, unspecified site: M19.90

## 2011-09-07 LAB — CBC
MCH: 28.7 pg (ref 26.0–34.0)
MCHC: 32.6 g/dL (ref 30.0–36.0)
Platelets: 270 10*3/uL (ref 150–400)
RDW: 12.7 % (ref 11.5–15.5)

## 2011-09-07 LAB — DIFFERENTIAL
Basophils Relative: 1 % (ref 0–1)
Eosinophils Absolute: 0 10*3/uL (ref 0.0–0.7)
Neutrophils Relative %: 78 % — ABNORMAL HIGH (ref 43–77)

## 2011-09-07 LAB — PROTIME-INR
INR: 0.96 (ref 0.00–1.49)
Prothrombin Time: 13 seconds (ref 11.6–15.2)

## 2011-09-07 LAB — URINALYSIS, ROUTINE W REFLEX MICROSCOPIC
Bilirubin Urine: NEGATIVE
Ketones, ur: NEGATIVE mg/dL
Nitrite: NEGATIVE
Urobilinogen, UA: 0.2 mg/dL (ref 0.0–1.0)
pH: 6.5 (ref 5.0–8.0)

## 2011-09-07 LAB — BASIC METABOLIC PANEL
BUN: 13 mg/dL (ref 6–23)
Calcium: 9.8 mg/dL (ref 8.4–10.5)
GFR calc non Af Amer: >90 mL/min (ref >90)
Glucose, Bld: 71 mg/dL (ref 70–99)

## 2011-09-07 LAB — URINE MICROSCOPIC-ADD ON

## 2011-09-07 NOTE — Patient Instructions (Addendum)
[address]  09/07/2011   Your procedure is scheduled on:  09-12-2011 Tuesday  Report to [facility] at 1100  AM.  Call this number if you have problems the morning of surgery: [phone]   Remember:   Do not eat food:After Midnight.              Clear liquids midnight until 0730 am, then nothing by mouth .  Take these medicines the morning of surgery with A SIP OF WATER: no meds to take   Do not wear jewelry or make up.  Do not wear lotions, powders, or perfumes.Do not wear deodorant.    Do not bring valuables to the hospital.  Contacts, dentures or bridgework may not be worn into surgery.  Leave suitcase in the car. After surgery it may be brought to your room.  For patients admitted to the hospital, checkout time is 11:00 AM the day of discharge.     Special Instructions: CHG Shower Use Special Wash: 1/2 bottle night before surgery and 1/2 bottle morning of surgery use regular soap on face and front and back private area, do not shave for 2 days before showers.   Please read over the following fact sheets that you were given: MRSA Information, blood fact sheet, incentive spirimeter fact sheet.  Cain Sieve [facility] pre op nurse phone number [phone], call if needed

## 2011-09-07 NOTE — Pre-Procedure Instructions (Signed)
Note to check micro ua results in epic sent to dr Charlann Boxer. Fax cobnfirmation received and placed in chart. Medical clearance note dr Constance Goltz on chart

## 2011-09-08 NOTE — H&P (Signed)
Patricia Conley, Patricia Conley               ACCOUNT NO.:  [id number]  MEDICAL RECORD NO.:  [id number]  LOCATION:                               FACILITY:  [facility]  PHYSICIAN:  Madlyn Frankel. Charlann Boxer, M.D.  DATE OF BIRTH:  Jul 31, 1950  DATE OF ADMISSION: DATE OF DISCHARGE:                             HISTORY & PHYSICAL   ADMITTING DIAGNOSIS:  End-stage osteoarthritis, left hip.  HISTORY OF PRESENT ILLNESS:  This is a 61 year old lady with a history of end-stage osteoarthritis of her left hip which has failed conservative treatment.  After discussion of treatments, benefits, risks, and options, the patient is now scheduled for total hip arthroplasty by anterior approach, left hip.  Note that she is a candidate for tranexamic acid and will receive that at surgery as well.  She is a candidate for dexamethasone and will receive that.  She is planning on going home after surgery.  She was given her home medications today of aspirin, Robaxin, iron, MiraLax, and Colace.  PAST MEDICAL HISTORY:  Drug allergies none.  CURRENT MEDICATIONS:  Celebrex 200 mg 1 daily.  PREVIOUS SURGERIES:  Include sinus surgery and breast cyst excision.  SERIOUS MEDICAL ILLNESSES:  Include reflux.  FAMILY HISTORY:  Positive for Parkinson's, stroke, cancer, and diabetes.  SOCIAL HISTORY:  The patient is divorced.  She is an Airline pilot.  She does not smoke and drinks 3-4 drinks per month.  Again she is planning on going home after surgery.  REVIEW OF SYSTEMS:  CENTRAL NERVOUS SYSTEM:  Negative for headache, blurred vision, or dizziness.  PULMONARY:  Negative shortness of breath, PND, and orthopnea.  CARDIOVASCULAR:  Negative chest pain, palpitation. GI:  Negative for ulcers or hepatitis, but positive for reflux.  GU: Negative for urinary tract difficulty.  MUSCULOSKELETAL:  Positive as in HPI.  PHYSICAL EXAMINATION:  GENERAL:  This is a well-developed, well- nourished lady, in no acute distress. VITAL SIGNS:  Show a blood  pressure of 119/80, pulse 80 and regular, respirations 14. HEENT:  Head normocephalic.  Nose patent.  Ears patent.  Pupils equal, round, and react to light.  Throat without injection. NECK:  Supple without adenopathy.  Carotids 2+ without bruit. CHEST:  Clear auscultation.  No rales or rhonchi.  Respirations 14. HEART:  Regular rate and rhythm at 80 beats per minute without murmur. ABDOMEN:  Soft with active bowel sounds.  No masses or organomegaly. NEUROLOGIC:  The patient alert and oriented to time, place, and person. Cranial nerves II through XII grossly intact. EXTREMITIES:  Shows left hip with painful range of motion. Neurovascular status intact.  ASSESSMENT:  End-stage osteoarthritis, left hip.  PLAN:  Left hip total hip arthroplasty by anterior approach.     Jaquelyn Bitter. Atonya Templer, P.A.   ______________________________ Madlyn Frankel Charlann Boxer, M.D.    SJC/MEDQ  D:  09/06/2011  T:  09/08/2011  Job:  [id number]

## 2011-09-11 NOTE — Pre-Procedure Instructions (Signed)
Spoke with pt, pt aware surgery time changed to 1510, clear liquids midnight until 0900am, then nothing by mouth, pt aware arrive 1230pm, wl Rafuse stay.

## 2011-09-12 ENCOUNTER — Ambulatory Visit (HOSPITAL_COMMUNITY): Payer: BC Managed Care – PPO

## 2011-09-12 ENCOUNTER — Inpatient Hospital Stay (HOSPITAL_COMMUNITY)
Admission: RE | Admit: 2011-09-12 | Discharge: 2011-09-14 | DRG: 818 | Disposition: A | Discharge status: Home Health | Payer: BC Managed Care – PPO | Source: Ambulatory Visit | Attending: Orthopedic Surgery | Admitting: Orthopedic Surgery

## 2011-09-12 ENCOUNTER — Encounter (HOSPITAL_COMMUNITY): Payer: Self-pay | Admitting: *Deleted

## 2011-09-12 ENCOUNTER — Inpatient Hospital Stay (HOSPITAL_COMMUNITY): Payer: BC Managed Care – PPO

## 2011-09-12 ENCOUNTER — Encounter (HOSPITAL_COMMUNITY)
Admission: RE | Disposition: A | Discharge status: Home Health | Payer: Self-pay | Source: Ambulatory Visit | Attending: Orthopedic Surgery

## 2011-09-12 ENCOUNTER — Ambulatory Visit (HOSPITAL_COMMUNITY): Payer: BC Managed Care – PPO | Admitting: *Deleted

## 2011-09-12 DIAGNOSIS — Z96649 Presence of unspecified artificial hip joint: Secondary | ICD-10-CM

## 2011-09-12 DIAGNOSIS — K219 Gastro-esophageal reflux disease without esophagitis: Secondary | ICD-10-CM | POA: Diagnosis present

## 2011-09-12 DIAGNOSIS — M1610 Unilateral primary osteoarthritis, unspecified hip: Principal | ICD-10-CM | POA: Diagnosis present

## 2011-09-12 DIAGNOSIS — M169 Osteoarthritis of hip, unspecified: Principal | ICD-10-CM | POA: Diagnosis present

## 2011-09-12 DIAGNOSIS — Z01812 Encounter for preprocedural laboratory examination: Secondary | ICD-10-CM

## 2011-09-12 DIAGNOSIS — Z79899 Other long term (current) drug therapy: Secondary | ICD-10-CM

## 2011-09-12 HISTORY — PX: TOTAL HIP ARTHROPLASTY: SHX124

## 2011-09-12 LAB — TYPE AND SCREEN
ABO/RH(D): O POS
Antibody Screen: NEGATIVE

## 2011-09-12 LAB — ABO/RH: ABO/RH(D): O POS

## 2011-09-12 SURGERY — ARTHROPLASTY, HIP, TOTAL, ANTERIOR APPROACH
Anesthesia: General | Site: Hip | Laterality: Left | Wound class: Clean

## 2011-09-12 MED ORDER — ACETAMINOPHEN 10 MG/ML IV SOLN
INTRAVENOUS | Status: DC | PRN
  Administered 2011-09-12: 1000 mg via INTRAVENOUS

## 2011-09-12 MED ORDER — CELECOXIB 100 MG PO CAPS
100.0000 mg | ORAL_CAPSULE | Freq: Every day | ORAL | Status: DC
  Filled 2011-09-12 (×3): qty 1

## 2011-09-12 MED ORDER — FERROUS SULFATE 325 (65 FE) MG PO TABS
325.0000 mg | ORAL_TABLET | Freq: Three times a day (TID) | ORAL | Status: DC
  Administered 2011-09-13 – 2011-09-14 (×3): 325 mg via ORAL
  Filled 2011-09-12 (×8): qty 1

## 2011-09-12 MED ORDER — ALUM & MAG HYDROXIDE-SIMETH 200-200-20 MG/5ML PO SUSP: 30.0000 mL | ORAL | Status: DC | PRN

## 2011-09-12 MED ORDER — KETAMINE HCL 10 MG/ML IJ SOLN
INTRAMUSCULAR | Status: DC | PRN
  Administered 2011-09-12 (×2): 10 mg via INTRAVENOUS
  Administered 2011-09-12: 20 mg via INTRAVENOUS
  Administered 2011-09-12: 10 mg via INTRAVENOUS

## 2011-09-12 MED ORDER — METOCLOPRAMIDE HCL 10 MG PO TABS: 5.0000 mg | ORAL_TABLET | Freq: Three times a day (TID) | ORAL | Status: DC | PRN

## 2011-09-12 MED ORDER — DEXAMETHASONE SODIUM PHOSPHATE 10 MG/ML IJ SOLN: 10.0000 mg | Freq: Once | INTRAMUSCULAR | Status: DC

## 2011-09-12 MED ORDER — RIVAROXABAN 10 MG PO TABS
10.0000 mg | ORAL_TABLET | ORAL | Status: DC
  Administered 2011-09-13 – 2011-09-14 (×2): 10 mg via ORAL
  Filled 2011-09-12 (×2): qty 1

## 2011-09-12 MED ORDER — FENTANYL CITRATE 0.05 MG/ML IJ SOLN
INTRAMUSCULAR | Status: DC | PRN
  Administered 2011-09-12 (×8): 50 ug via INTRAVENOUS

## 2011-09-12 MED ORDER — BISACODYL 5 MG PO TBEC: 5.0000 mg | DELAYED_RELEASE_TABLET | Freq: Every day | ORAL | Status: DC | PRN

## 2011-09-12 MED ORDER — DOCUSATE SODIUM 100 MG PO CAPS
100.0000 mg | ORAL_CAPSULE | Freq: Two times a day (BID) | ORAL | Status: DC
  Administered 2011-09-13 – 2011-09-14 (×3): 100 mg via ORAL

## 2011-09-12 MED ORDER — ZOLPIDEM TARTRATE 5 MG PO TABS: 5.0000 mg | ORAL_TABLET | Freq: Every evening | ORAL | Status: DC | PRN

## 2011-09-12 MED ORDER — METHOCARBAMOL 100 MG/ML IJ SOLN
500.0000 mg | Freq: Four times a day (QID) | INTRAVENOUS | Status: DC | PRN
  Administered 2011-09-12 – 2011-09-13 (×3): 500 mg via INTRAVENOUS
  Filled 2011-09-12 (×4): qty 5

## 2011-09-12 MED ORDER — HYDROMORPHONE HCL PF 1 MG/ML IJ SOLN
0.2500 mg | INTRAMUSCULAR | Status: DC | PRN
  Administered 2011-09-12 (×4): 0.5 mg via INTRAVENOUS

## 2011-09-12 MED ORDER — SODIUM CHLORIDE 0.9 % IV SOLN
100.0000 mL/h | INTRAVENOUS | Status: AC
  Administered 2011-09-12: 100 mL/h via INTRAVENOUS
  Filled 2011-09-12 (×3): qty 1000

## 2011-09-12 MED ORDER — POLYETHYLENE GLYCOL 3350 17 G PO PACK
17.0000 g | PACK | Freq: Two times a day (BID) | ORAL | Status: DC
  Administered 2011-09-13 – 2011-09-14 (×3): 17 g via ORAL

## 2011-09-12 MED ORDER — HYDROMORPHONE HCL PF 1 MG/ML IJ SOLN
INTRAMUSCULAR | Status: AC
  Filled 2011-09-12: qty 1

## 2011-09-12 MED ORDER — ACETAMINOPHEN 10 MG/ML IV SOLN
INTRAVENOUS | Status: AC
  Filled 2011-09-12: qty 100

## 2011-09-12 MED ORDER — METHOCARBAMOL 500 MG PO TABS
500.0000 mg | ORAL_TABLET | Freq: Four times a day (QID) | ORAL | Status: DC | PRN
  Administered 2011-09-13 – 2011-09-14 (×2): 500 mg via ORAL
  Filled 2011-09-12 (×2): qty 1

## 2011-09-12 MED ORDER — CEFAZOLIN SODIUM 1-5 GM-% IV SOLN
1.0000 g | INTRAVENOUS | Status: AC
  Administered 2011-09-12: 1 g via INTRAVENOUS

## 2011-09-12 MED ORDER — ONDANSETRON HCL 4 MG PO TABS: 4.0000 mg | ORAL_TABLET | Freq: Four times a day (QID) | ORAL | Status: DC | PRN

## 2011-09-12 MED ORDER — NEOSTIGMINE METHYLSULFATE 1 MG/ML IJ SOLN
INTRAMUSCULAR | Status: DC | PRN
  Administered 2011-09-12 (×2): 2 mg via INTRAVENOUS

## 2011-09-12 MED ORDER — LACTATED RINGERS IV SOLN
INTRAVENOUS | Status: DC | PRN
  Administered 2011-09-12 (×3): via INTRAVENOUS

## 2011-09-12 MED ORDER — PROPOFOL 10 MG/ML IV EMUL
INTRAVENOUS | Status: DC | PRN
  Administered 2011-09-12: 100 mg via INTRAVENOUS

## 2011-09-12 MED ORDER — PHENYLEPHRINE HCL 10 MG/ML IJ SOLN
INTRAMUSCULAR | Status: DC | PRN
  Administered 2011-09-12: 80 ug via INTRAVENOUS
  Administered 2011-09-12: 120 ug via INTRAVENOUS
  Administered 2011-09-12: 80 ug via INTRAVENOUS
  Administered 2011-09-12 (×2): 120 ug via INTRAVENOUS
  Administered 2011-09-12: 80 ug via INTRAVENOUS

## 2011-09-12 MED ORDER — ROCURONIUM BROMIDE 100 MG/10ML IV SOLN
INTRAVENOUS | Status: DC | PRN
  Administered 2011-09-12: 20 mg via INTRAVENOUS
  Administered 2011-09-12: 10 mg via INTRAVENOUS
  Administered 2011-09-12: 30 mg via INTRAVENOUS

## 2011-09-12 MED ORDER — HETASTARCH-ELECTROLYTES 6 % IV SOLN
INTRAVENOUS | Status: DC | PRN
  Administered 2011-09-12: 17:00:00 via INTRAVENOUS

## 2011-09-12 MED ORDER — PHENOL 1.4 % MT LIQD
1.0000 | OROMUCOSAL | Status: DC | PRN
  Filled 2011-09-12: qty 177

## 2011-09-12 MED ORDER — ONDANSETRON HCL 4 MG/2ML IJ SOLN
4.0000 mg | Freq: Four times a day (QID) | INTRAMUSCULAR | Status: DC | PRN
  Administered 2011-09-13: 4 mg via INTRAVENOUS
  Filled 2011-09-12: qty 2

## 2011-09-12 MED ORDER — HYDROCODONE-ACETAMINOPHEN 7.5-325 MG PO TABS
1.0000 | ORAL_TABLET | ORAL | Status: DC
  Administered 2011-09-13 (×3): 2 via ORAL
  Administered 2011-09-13 (×2): 1 via ORAL
  Administered 2011-09-14: 2 via ORAL
  Administered 2011-09-14: 1 via ORAL
  Administered 2011-09-14: 2 via ORAL
  Filled 2011-09-12: qty 2
  Filled 2011-09-12: qty 1
  Filled 2011-09-12: qty 2
  Filled 2011-09-12: qty 1
  Filled 2011-09-12 (×3): qty 2
  Filled 2011-09-12: qty 1

## 2011-09-12 MED ORDER — CEFAZOLIN SODIUM 1-5 GM-% IV SOLN
1.0000 g | Freq: Four times a day (QID) | INTRAVENOUS | Status: AC
  Administered 2011-09-12 – 2011-09-13 (×3): 1 g via INTRAVENOUS
  Filled 2011-09-12 (×3): qty 50

## 2011-09-12 MED ORDER — DEXAMETHASONE SODIUM PHOSPHATE 4 MG/ML IJ SOLN
INTRAMUSCULAR | Status: DC | PRN
  Administered 2011-09-12: 10 mg via INTRAVENOUS

## 2011-09-12 MED ORDER — MIDAZOLAM HCL 5 MG/5ML IJ SOLN
INTRAMUSCULAR | Status: DC | PRN
  Administered 2011-09-12: 2 mg via INTRAVENOUS

## 2011-09-12 MED ORDER — METOCLOPRAMIDE HCL 5 MG/ML IJ SOLN
5.0000 mg | Freq: Three times a day (TID) | INTRAMUSCULAR | Status: DC | PRN
  Administered 2011-09-12: 10 mg via INTRAVENOUS
  Filled 2011-09-12: qty 2

## 2011-09-12 MED ORDER — FLEET ENEMA 7-19 GM/118ML RE ENEM: 1.0000 | ENEMA | Freq: Once | RECTAL | Status: AC | PRN

## 2011-09-12 MED ORDER — HYDROMORPHONE HCL PF 1 MG/ML IJ SOLN: 0.5000 mg | INTRAMUSCULAR | Status: DC | PRN

## 2011-09-12 MED ORDER — ONDANSETRON HCL 4 MG/2ML IJ SOLN
INTRAMUSCULAR | Status: DC | PRN
  Administered 2011-09-12: 4 mg via INTRAVENOUS

## 2011-09-12 MED ORDER — PROMETHAZINE HCL 25 MG/ML IJ SOLN: 6.2500 mg | INTRAMUSCULAR | Status: DC | PRN

## 2011-09-12 MED ORDER — CEFAZOLIN SODIUM 1-5 GM-% IV SOLN
INTRAVENOUS | Status: AC
  Filled 2011-09-12: qty 50

## 2011-09-12 MED ORDER — DEXAMETHASONE SODIUM PHOSPHATE 10 MG/ML IJ SOLN
10.0000 mg | Freq: Once | INTRAMUSCULAR | Status: AC
  Administered 2011-09-13: 10 mg via INTRAVENOUS
  Filled 2011-09-12: qty 1

## 2011-09-12 MED ORDER — LIDOCAINE HCL (CARDIAC) 20 MG/ML IV SOLN
INTRAVENOUS | Status: DC | PRN
  Administered 2011-09-12: 50 mg via INTRAVENOUS

## 2011-09-12 MED ORDER — MENTHOL 3 MG MT LOZG
1.0000 | LOZENGE | OROMUCOSAL | Status: DC | PRN
  Filled 2011-09-12: qty 9

## 2011-09-12 MED ORDER — LACTATED RINGERS IV SOLN
INTRAVENOUS | Status: DC
  Administered 2011-09-12: 1000 mL via INTRAVENOUS

## 2011-09-12 MED ORDER — GLYCOPYRROLATE 0.2 MG/ML IJ SOLN
INTRAMUSCULAR | Status: DC | PRN
  Administered 2011-09-12 (×2): 0.2 mg via INTRAVENOUS

## 2011-09-12 MED ORDER — TRANEXAMIC ACID 100 MG/ML IV SOLN
15.0000 mg/kg | Freq: Once | INTRAVENOUS | Status: DC
  Filled 2011-09-12: qty 9.03

## 2011-09-12 MED ORDER — DIPHENHYDRAMINE HCL 25 MG PO CAPS: 25.0000 mg | ORAL_CAPSULE | Freq: Four times a day (QID) | ORAL | Status: DC | PRN

## 2011-09-12 MED ORDER — 0.9 % SODIUM CHLORIDE (POUR BTL) OPTIME
TOPICAL | Status: DC | PRN
  Administered 2011-09-12: 1000 mL

## 2011-09-12 SURGICAL SUPPLY — 39 items
BAG ZIPLOCK 12X15 (MISCELLANEOUS) ×4 IMPLANT
BLADE SAW SGTL 18X1.27X75 (BLADE) ×2 IMPLANT
CELLS DAT CNTRL 66122 CELL SVR (MISCELLANEOUS) IMPLANT
CLOTH BEACON ORANGE TIMEOUT ST (SAFETY) ×2 IMPLANT
DERMABOND ADVANCED (GAUZE/BANDAGES/DRESSINGS) ×1
DERMABOND ADVANCED .7 DNX12 (GAUZE/BANDAGES/DRESSINGS) ×1 IMPLANT
DRAPE C-ARM 42X72 X-RAY (DRAPES) ×2 IMPLANT
DRAPE STERI IOBAN 125X83 (DRAPES) ×2 IMPLANT
DRAPE U-SHAPE 47X51 STRL (DRAPES) ×6 IMPLANT
DRSG AQUACEL AG ADV 3.5X10 (GAUZE/BANDAGES/DRESSINGS) ×2 IMPLANT
DRSG TEGADERM 4X4.75 (GAUZE/BANDAGES/DRESSINGS) ×4 IMPLANT
DURAPREP 26ML APPLICATOR (WOUND CARE) ×2 IMPLANT
ELECT BLADE TIP CTD 4 INCH (ELECTRODE) ×2 IMPLANT
ELECT REM PT RETURN 9FT ADLT (ELECTROSURGICAL) ×2
ELECTRODE REM PT RTRN 9FT ADLT (ELECTROSURGICAL) ×1 IMPLANT
EVACUATOR 1/8 PVC DRAIN (DRAIN) ×2 IMPLANT
FACESHIELD LNG OPTICON STERILE (SAFETY) ×8 IMPLANT
GAUZE SPONGE 2X2 8PLY STRL LF (GAUZE/BANDAGES/DRESSINGS) ×1 IMPLANT
GLOVE BIOGEL PI IND STRL 7.5 (GLOVE) ×1 IMPLANT
GLOVE BIOGEL PI IND STRL 8 (GLOVE) ×1 IMPLANT
GLOVE BIOGEL PI INDICATOR 7.5 (GLOVE) ×1
GLOVE BIOGEL PI INDICATOR 8 (GLOVE) ×1
GLOVE ECLIPSE 8.0 STRL XLNG CF (GLOVE) ×2 IMPLANT
GLOVE ORTHO TXT STRL SZ7.5 (GLOVE) ×6 IMPLANT
GOWN BRE IMP PREV XXLGXLNG (GOWN DISPOSABLE) IMPLANT
GOWN STRL NON-REIN LRG LVL3 (GOWN DISPOSABLE) IMPLANT
KIT BASIN OR (CUSTOM PROCEDURE TRAY) ×2 IMPLANT
PACK TOTAL JOINT (CUSTOM PROCEDURE TRAY) ×2 IMPLANT
PADDING CAST COTTON 6X4 STRL (CAST SUPPLIES) ×2 IMPLANT
RTRCTR WOUND ALEXIS 18CM MED (MISCELLANEOUS)
SPONGE GAUZE 2X2 STER 10/PKG (GAUZE/BANDAGES/DRESSINGS) ×1
SUCTION FRAZIER 12FR DISP (SUCTIONS) ×2 IMPLANT
SUT MNCRL AB 4-0 PS2 18 (SUTURE) ×2 IMPLANT
SUT VIC AB 1 CT1 36 (SUTURE) ×6 IMPLANT
SUT VIC AB 2-0 CT1 27 (SUTURE) ×2
SUT VIC AB 2-0 CT1 TAPERPNT 27 (SUTURE) ×2 IMPLANT
SUT VLOC 180 0 24IN GS25 (SUTURE) ×2 IMPLANT
TOWEL OR 17X26 10 PK STRL BLUE (TOWEL DISPOSABLE) ×4 IMPLANT
TRAY FOLEY CATH 14FRSI W/METER (CATHETERS) ×2 IMPLANT

## 2011-09-12 NOTE — Transfer of Care (Signed)
Immediate Anesthesia Transfer of Care Note  Patient: Patricia Conley  Procedure(s) Performed: Procedure(s) (LRB): TOTAL HIP ARTHROPLASTY ANTERIOR APPROACH (Left)  Patient Location: PACU  Anesthesia Type: General  Level of Consciousness: awake, alert , oriented, patient cooperative and responds to stimulation  Airway & Oxygen Therapy: Patient Spontanous Breathing and Patient connected to face mask oxygen  Post-op Assessment: Report given to PACU RN, Post -op Vital signs reviewed and stable and Patient moving all extremities  Post vital signs: Reviewed and stable  Complications: No apparent anesthesia complications

## 2011-09-12 NOTE — Op Note (Signed)
NAME:  Patricia Conley                ACCOUNT NO.: [id number]      MEDICAL RECORD NO.: [id number]      FACILITY:  [facility]      PHYSICIAN:  Hewitt Garner D  DATE OF BIRTH:  11-03-1950     DATE OF PROCEDURE:  09/12/2011                                 OPERATIVE REPORT         PREOPERATIVE DIAGNOSIS: Left  hip osteoarthritis.      POSTOPERATIVE DIAGNOSIS:  Left hip osteoarthritis.      PROCEDURE:  Left total hip replacement through an anterior approach   utilizing DePuy THR system, component size 52mm pinnacle cup, a size 36+4 neutral   Altrex liner, a size 7 standard Tri Lock stem with a 36+1.5 delta ceramic   ball.      SURGEON:  Madlyn Frankel. Charlann Boxer, M.D.      ASSISTANT:  Lanney Gins, PA      ANESTHESIA:  Regional.      SPECIMENS:  None.      COMPLICATIONS:  None.      BLOOD LOSS:  900 cc     DRAINS:  One Hemovac.      INDICATION OF THE PROCEDURE:  Patricia Conley is a 61 y.o. female who had   presented to office for evaluation of left hip pain.  Radiographs revealed   progressive degenerative changes with bone-on-bone   articulation to the  hip joint.  The patient had painful limited range of   motion significantly affecting their overall quality of life.  The patient was failing to    respond to conservative measures, and at this point was ready   to proceed with more definitive measures.  The patient has noted progressive   degenerative changes in his hip, progressive problems and dysfunction   with regarding the hip prior to surgery.  Consent was obtained for   benefit of pain relief.  Specific risk of infection, DVT, component   failure, dislocation, need for revision surgery, as well discussion of   the anterior versus posterior approach were reviewed.  Consent was   obtained for benefit of anterior pain relief through an anterior   approach.      PROCEDURE IN DETAIL:  The patient was brought to operative theater.   Once adequate anesthesia, preoperative  antibiotics, 2gm Ancef administered.   The patient was positioned supine on the OSI Hanna table.  Once adequate   padding of boney process was carried out, we had predraped out the hip, and  used fluoroscopy to confirm orientation of the pelvis and position.      The left hip was then prepped and draped from proximal iliac crest to   mid thigh with shower curtain technique.      Time-out was performed identifying the patient, planned procedure, and   extremity.     An incision was then made 2 cm distal and lateral to the   anterior superior iliac spine extending over the orientation of the   tensor fascia lata muscle and sharp dissection was carried down to the   fascia of the muscle and protractor placed in the soft tissues.      The fascia was then incised.  The muscle belly was identified and swept  laterally and retractor placed along the superior neck.  Following   cauterization of the circumflex vessels and removing some pericapsular   fat, a second cobra retractor was placed on the inferior neck.  A third   retractor was placed on the anterior acetabulum after elevating the   anterior rectus.  A L-capsulotomy was along the line of the   superior neck to the trochanteric fossa, then extended proximally and   distally.  Tag sutures were placed and the retractors were then placed   intracapsular.  We then identified the trochanteric fossa and   orientation of my neck cut, confirmed this radiographically   and then made a neck osteotomy with the femur on traction.  The femoral   head was removed without difficulty or complication.  Traction was let   off and retractors were placed posterior and anterior around the   acetabulum.      The labrum and foveal tissue were debrided.  I began reaming with a 45mm   reamer and reamed up to 51mm reamer with good bony bed preparation and a 52   cup was chosen.  The final 52mm Pinnacle cup was then impacted under fluoroscopy  to confirm the  depth of penetration and orientation with respect to   abduction.  A screw was placed followed by the hole eliminator.  The final   36+4 Altrex liner was impacted with good visualized rim fit.  The cup was positioned anatomically within the acetabular portion of the pelvis.      At this point, the femur was rolled at 80 degrees.  Further capsule was   released off the inferior aspect of the femoral neck.  I then   released the superior capsule proximally.  The hook was placed laterally   along the femur and elevated manually and held in position with the bed   hook.  The leg was then extended and adducted with the leg rolled to 100   degrees of external rotation.  Once the proximal femur was fully   exposed, I used a box osteotome to set orientation.  I then began   broaching with the starting chili pepper broach and passed this by hand and then broached up to 7.  With the 7 broach in place I chose a standard neck and did a trial reduction with a 36+1.5 ball.  The offset was appropriate, leg lengths   appeared to be equal, confirmed radiographically.   Given these findings, I went ahead and dislocated the hip, repositioned all   retractors and positioned the right hip in the extended and abducted position.  The final 7 standard Tri Lock stem was   chosen and it was impacted down to the level of neck cut.  Based on this   and the trial reduction, a 36+1.5 delta ceramic ball was chosen and   impacted onto a clean and dry trunnion, and the hip was reduced.  The   hip had been irrigated throughout the case again at this point.  I did   reapproximate the superior capsular leaflet to the anterior leaflet   using #1 Vicryl, placed a medium Hemovac drain deep.  The fascia of the   tensor fascia lata muscle was then reapproximated using #1 Vicryl.  The   remaining wound was closed with 2-0 Vicryl and running 4-0 Monocryl.   The hip was cleaned, dried, and dressed sterilely using Dermabond and    Aquacel dressing.  Drain site dressed separately.  She was then brought   to recovery room in stable condition tolerating the procedure well.    Danae Orleans, PA-C was present for the entirety of the case involved from   preoperative positioning, perioperative retractor management, general   facilitation of the case, as well as primary wound closure as assistant.            Pietro Cassis Alvan Dame, M.D.            MDO/MEDQ  D:  02/14/2011  T:  02/14/2011  Job:  [id number]      Electronically Signed by Paralee Cancel M.D. on 02/20/2011 09:15:38 AM

## 2011-09-12 NOTE — Anesthesia Preprocedure Evaluation (Signed)
Anesthesia Evaluation  Patient identified by MRN, date of birth, ID band Patient awake    Reviewed: Allergy & Precautions, H&P , NPO status , Patient's Chart, lab work & pertinent test results  Airway Mallampati: II TM Distance: >3 FB Neck ROM: Full    Dental No notable dental hx.    Pulmonary neg pulmonary ROS,  breath sounds clear to auscultation  Pulmonary exam normal       Cardiovascular negative cardio ROS  Rhythm:Regular Rate:Normal     Neuro/Psych negative neurological ROS  negative psych ROS   GI/Hepatic Neg liver ROS, GERD-  ,  Endo/Other  negative endocrine ROS  Renal/GU negative Renal ROS  negative genitourinary   Musculoskeletal negative musculoskeletal ROS (+)   Abdominal   Peds negative pediatric ROS (+)  Hematology negative hematology ROS (+)   Anesthesia Other Findings   Reproductive/Obstetrics negative OB ROS                           Anesthesia Physical Anesthesia Plan  ASA: I  Anesthesia Plan: General   Post-op Pain Management:    Induction: Intravenous  Airway Management Planned: Oral ETT  Additional Equipment:   Intra-op Plan:   Post-operative Plan: Extubation in OR  Informed Consent: I have reviewed the patients History and Physical, chart, labs and discussed the procedure including the risks, benefits and alternatives for the proposed anesthesia with the patient or authorized representative who has indicated his/her understanding and acceptance.   Dental advisory given  Plan Discussed with: CRNA  Anesthesia Plan Comments: (Discussed risks/benefits general versus spinal. Prefers general.)        Anesthesia Quick Evaluation

## 2011-09-12 NOTE — Interval H&P Note (Signed)
History and Physical Interval Note:  09/12/2011 1:23 PM  Patricia Conley  has presented today for surgery, with the diagnosis of left hip osteoarthritis  The various methods of treatment have been discussed with the patient and family. After consideration of risks, benefits and other options for treatment, the patient has consented to  Procedure(s) (LRB): LEFT TOTAL HIP ARTHROPLASTY ANTERIOR APPROACH (Left) as a surgical intervention .  The patients' history has been reviewed, patient examined, no change in status, stable for surgery.  I have reviewed the patients' chart and labs.  Questions were answered to the patient's satisfaction.     [name]

## 2011-09-12 NOTE — Anesthesia Postprocedure Evaluation (Signed)
  Anesthesia Post-op Note  Patient: Patricia Conley  Procedure(s) Performed: Procedure(s) (LRB): TOTAL HIP ARTHROPLASTY ANTERIOR APPROACH (Left)  Patient Location: PACU  Anesthesia Type: General  Level of Consciousness: awake and alert   Airway and Oxygen Therapy: Patient Spontanous Breathing  Post-op Pain: mild  Post-op Assessment: Post-op Vital signs reviewed, Patient's Cardiovascular Status Stable, Respiratory Function Stable, Patent Airway and No signs of Nausea or vomiting  Post-op Vital Signs: stable  Complications: No apparent anesthesia complications

## 2011-09-12 NOTE — Anesthesia Procedure Notes (Signed)
Procedure Name: Intubation Date/Time: 09/12/2011 3:16 PM Performed by: Randon Goldsmith CATHERINE PAYNE Pre-anesthesia Checklist: Patient identified, Emergency Drugs available, Suction available and Patient being monitored Patient Re-evaluated:Patient Re-evaluated prior to inductionOxygen Delivery Method: Circle system utilized Preoxygenation: Pre-oxygenation with 100% oxygen Intubation Type: IV induction Ventilation: Mask ventilation without difficulty Laryngoscope Size: Miller and 2 Grade View: Grade I Tube type: Oral Tube size: 7.0 mm Number of attempts: 2 Airway Equipment and Method: Stylet Placement Confirmation: ETT inserted through vocal cords under direct vision,  positive ETCO2,  CO2 detector and breath sounds checked- equal and bilateral Secured at: 22 cm Tube secured with: Tape Dental Injury: Teeth and Oropharynx as per pre-operative assessment

## 2011-09-12 NOTE — Progress Notes (Signed)
Pt states she finished taking Cipro for UTI

## 2011-09-12 NOTE — Preoperative (Signed)
Beta Blockers   Reason not to administer Beta Blockers:Not Applicable, not on  Home BB 

## 2011-09-13 LAB — BASIC METABOLIC PANEL
Chloride: 106 mEq/L (ref 96–112)
Creatinine, Ser: 0.69 mg/dL (ref 0.50–1.10)
GFR calc Af Amer: >90 mL/min (ref >90)
Sodium: 138 mEq/L (ref 135–145)

## 2011-09-13 LAB — URINALYSIS, ROUTINE W REFLEX MICROSCOPIC
Leukocytes, UA: NEGATIVE
Nitrite: NEGATIVE
Specific Gravity, Urine: 1.011 (ref 1.005–1.030)
pH: 6 (ref 5.0–8.0)

## 2011-09-13 LAB — CBC
MCV: 87.6 fL (ref 78.0–100.0)
Platelets: 178 10*3/uL (ref 150–400)
RDW: 12.7 % (ref 11.5–15.5)
WBC: 7.2 10*3/uL (ref 4.0–10.5)

## 2011-09-13 LAB — URINE MICROSCOPIC-ADD ON

## 2011-09-13 MED ORDER — IBUPROFEN 600 MG PO TABS
600.0000 mg | ORAL_TABLET | Freq: Once | ORAL | Status: DC
  Filled 2011-09-13 (×2): qty 1

## 2011-09-13 NOTE — Evaluation (Signed)
Physical Therapy Evaluation Patient Details Name: Patricia Conley MRN: [id number] DOB: 12-18-1950 Today's Date: 09/13/2011 Time:  -     PT Assessment / Plan / Recommendation Clinical Impression       PT Assessment  Patient needs continued PT services    Follow Up Recommendations  Home health PT    Barriers to Discharge        lEquipment Recommendations  Rolling walker with 5" wheels    Recommendations for Other Services OT consult   Frequency 7X/week    Precautions / Restrictions Restrictions Weight Bearing Restrictions: No Other Position/Activity Restrictions: WBAT         Mobility  Bed Mobility Bed Mobility: Supine to Sit Supine to Sit: 4: Min assist Details for Bed Mobility Assistance: cues for sequence and use of UEs to self assist Transfers Transfers: Sit to Stand;Stand to Sit Sit to Stand: 4: Min assist;3: Mod assist Stand to Sit: 4: Min assist Details for Transfer Assistance: cues for use of UEs and for LE management Ambulation/Gait Ambulation/Gait Assistance: 4: Min assist;3: Mod assist Assistive device: Rolling walker Gait Pattern: Step-to pattern    Exercises Total Joint Exercises Ankle Circles/Pumps: AROM;10 reps;Supine;Both Heel Slides: AAROM;15 reps;Supine;Left Hip ABduction/ADduction: AAROM;10 reps;Supine;Left   PT Diagnosis: Difficulty walking  PT Problem List:   PT Treatment Interventions: DME instruction;Gait training;Stair training;Functional mobility training;Therapeutic activities;Therapeutic exercise;Patient/family education   PT Goals Acute Rehab PT Goals PT Goal Formulation: With patient Time For Goal Achievement: 09/19/11 Potential to Achieve Goals: Good Pt will go Supine/Side to Sit: with supervision Pt will go Sit to Supine/Side: with supervision Pt will go Sit to Stand: with supervision Pt will go Stand to Sit: with supervision Pt will Ambulate: 51 - 150 feet;with supervision;with rolling walker Pt will Go Up / Down Stairs:  1-2 stairs;with min assist;with least restrictive assistive device  Visit Information  Assistance Needed: +1    Subjective Data  Subjective: I'm afraid I am going to seize up from not moving Patient Stated Goal: Resume previous active lifestyle with decreased pain   Prior Functioning  Home Living Lives With: Daughter Available Help at Discharge: Family Type of Home: House Home Access: Stairs to enter Secretary/administrator of Steps: 1 (1+1) Entrance Stairs-Rails: None Home Layout: One level Prior Function Level of Independence: Independent Able to Take Stairs?: Yes Driving: Yes Communication Communication: No difficulties    Cognition  Overall Cognitive Status: Appears within functional limits for tasks assessed/performed Arousal/Alertness: Awake/alert Orientation Level: Appears intact for tasks assessed Behavior During Session: [facility] for tasks performed    Extremity/Trunk Assessment Right Upper Extremity Assessment RUE ROM/Strength/Tone: Within functional levels Left Upper Extremity Assessment LUE ROM/Strength/Tone: Within functional levels Right Lower Extremity Assessment RLE ROM/Strength/Tone: Within functional levels Left Lower Extremity Assessment LLE ROM/Strength/Tone: Deficits LLE ROM/Strength/Tone Deficits: AAROM TO 9- HIP FLEX AND 30 HIP ABD; Hip strength 3-/5   Balance    End of Session PT - End of Session Activity Tolerance: Other (comment) (ltd by dizziness/ decreased BP with OOB activity) Patient left: in chair;with call bell/phone within reach Nurse Communication: Mobility status;Other (comment) (pt BP)   Keiana Tavella 09/13/2011, 3:41 PM

## 2011-09-13 NOTE — Progress Notes (Signed)
Physical Therapy Treatment Patient Details Name: Patricia Conley MRN: [id number] DOB: Jul 22, 1950 Today's Date: 09/13/2011 Time: 4098-1191 PT Time Calculation (min): 23 min  PT Assessment / Plan / Recommendation Comments on Treatment Session  pt with BP sitting 92/49; ambulating 96/58.  Pt denies symptoms.  RN aware    Follow Up Recommendations  Home health PT    Barriers to Discharge        Equipment Recommendations  Rolling walker with 5" wheels    Recommendations for Other Services OT consult  Frequency 7X/week   Plan Discharge plan remains appropriate    Precautions / Restrictions Restrictions Weight Bearing Restrictions: No Other Position/Activity Restrictions: WBAT   Pertinent Vitals/Pain     Mobility  Bed Mobility Bed Mobility: Supine to Sit Supine to Sit: 4: Min assist Details for Bed Mobility Assistance: cues for sequence and use of UEs to self assist Transfers Transfers: Sit to Stand;Stand to Sit Sit to Stand: 4: Min assist Stand to Sit: 4: Min assist Details for Transfer Assistance: cues for use of UEs and for LE management Ambulation/Gait Ambulation/Gait Assistance: 4: Min assist Ambulation Distance (Feet): 134 Feet Assistive device: Rolling walker Ambulation/Gait Assistance Details: cues for posture, position from RW and sequence Gait Pattern: Step-to pattern    Exercises Total Joint Exercises Ankle Circles/Pumps: AROM;10 reps;Supine;Both Heel Slides: AAROM;15 reps;Supine;Left Hip ABduction/ADduction: AAROM;10 reps;Supine;Left   PT Diagnosis: Difficulty walking  PT Problem List:   PT Treatment Interventions: DME instruction;Gait training;Stair training;Functional mobility training;Therapeutic activities;Therapeutic exercise;Patient/family education   PT Goals Acute Rehab PT Goals PT Goal Formulation: With patient Time For Goal Achievement: 09/19/11 Potential to Achieve Goals: Good Pt will go Supine/Side to Sit: with supervision PT Goal:  Supine/Side to Sit - Progress: Goal set today Pt will go Sit to Supine/Side: with supervision PT Goal: Sit to Supine/Side - Progress: Goal set today Pt will go Sit to Stand: with supervision PT Goal: Sit to Stand - Progress: Progressing toward goal Pt will go Stand to Sit: with supervision PT Goal: Stand to Sit - Progress: Progressing toward goal Pt will Ambulate: 51 - 150 feet;with supervision;with rolling walker PT Goal: Ambulate - Progress: Progressing toward goal Pt will Go Up / Down Stairs: 1-2 stairs;with min assist;with least restrictive assistive device PT Goal: Up/Down Stairs - Progress: Goal set today  Visit Information  Last PT Received On: 09/13/11 Assistance Needed: +1    Subjective Data  Subjective: [name] Patient Stated Goal: Resume previous active lifestyle with decreased pain   Cognition  Overall Cognitive Status: Appears within functional limits for tasks assessed/performed Arousal/Alertness: Awake/alert Orientation Level: Appears intact for tasks assessed Behavior During Session: [facility] for tasks performed    Balance     End of Session PT - End of Session Activity Tolerance: Patient tolerated treatment well Patient left: in chair;with call bell/phone within reach Nurse Communication: Mobility status;Other (comment)    Hasel Janish 09/13/2011, 3:48 PM

## 2011-09-13 NOTE — Progress Notes (Signed)
Patient complained of frequent urination. Notified Dr.Olin.  Routine urinalysis to lab done.

## 2011-09-13 NOTE — Progress Notes (Signed)
Utilization review completed.  

## 2011-09-13 NOTE — Progress Notes (Signed)
Subjective: 1 Day Post-Op Procedure(s) (LRB): TOTAL HIP ARTHROPLASTY ANTERIOR APPROACH (Left)   Patient reports pain as mild, well controlled with medication. C/O muscle pain. No events throughout the night. Spent some time discussing the case, findings and expectation for the patient.   Objective:   VITALS:   Filed Vitals:   09/13/11 0623  BP: 91/57  Pulse: 79  Temp: 98.5 F (36.9 C)  Resp: 12    Neurovascular intact Dorsiflexion/Plantar flexion intact Incision: dressing C/D/I No cellulitis present Compartment soft  LABS  Basename 09/13/11 0400  HGB 8.9*  HCT 26.8*  WBC 7.2  PLT 178     Basename 09/13/11 0400  NA 138  K 4.6  BUN 10  CREATININE 0.69  GLUCOSE 141*     Assessment/Plan: 1 Day Post-Op Procedure(s) (LRB): TOTAL HIP ARTHROPLASTY ANTERIOR APPROACH (Left)   HV drain d/c'ed Foley cath d/c'ed Advance diet Up with therapy D/C IV fluids Plan for discharge tomorrow if continues to do well   Anastasio Auerbach. Emine Lopata   PAC  09/13/2011, 9:56 AM

## 2011-09-14 ENCOUNTER — Encounter (HOSPITAL_COMMUNITY): Payer: Self-pay | Admitting: Orthopedic Surgery

## 2011-09-14 LAB — BASIC METABOLIC PANEL
BUN: 11 mg/dL (ref 6–23)
CO2: 28 mEq/L (ref 19–32)
Chloride: 104 mEq/L (ref 96–112)
GFR calc Af Amer: >90 mL/min (ref >90)
Potassium: 4 mEq/L (ref 3.5–5.1)

## 2011-09-14 LAB — CBC
HCT: 25.9 % — ABNORMAL LOW (ref 36.0–46.0)
Platelets: 165 10*3/uL (ref 150–400)
RBC: 2.95 MIL/uL — ABNORMAL LOW (ref 3.87–5.11)
RDW: 12.9 % (ref 11.5–15.5)
WBC: 9.3 10*3/uL (ref 4.0–10.5)

## 2011-09-14 MED ORDER — HYDROCODONE-ACETAMINOPHEN 7.5-325 MG PO TABS: 1.0000 | ORAL_TABLET | ORAL | Status: AC | PRN

## 2011-09-14 MED ORDER — DSS 100 MG PO CAPS: 100.0000 mg | ORAL_CAPSULE | Freq: Two times a day (BID) | ORAL | Status: AC

## 2011-09-14 MED ORDER — CELECOXIB 100 MG PO CAPS: 100.0000 mg | ORAL_CAPSULE | Freq: Two times a day (BID) | ORAL | Status: DC

## 2011-09-14 MED ORDER — POLYETHYLENE GLYCOL 3350 17 G PO PACK: 17.0000 g | PACK | Freq: Two times a day (BID) | ORAL | Status: AC

## 2011-09-14 MED ORDER — FERROUS SULFATE 325 (65 FE) MG PO TABS: 325.0000 mg | ORAL_TABLET | Freq: Three times a day (TID) | ORAL | Status: DC

## 2011-09-14 MED ORDER — DIPHENHYDRAMINE HCL 25 MG PO CAPS: 25.0000 mg | ORAL_CAPSULE | Freq: Four times a day (QID) | ORAL | Status: DC | PRN

## 2011-09-14 MED ORDER — METHOCARBAMOL 500 MG PO TABS: 500.0000 mg | ORAL_TABLET | Freq: Four times a day (QID) | ORAL | Status: AC | PRN

## 2011-09-14 MED ORDER — ASPIRIN EC 325 MG PO TBEC: 325.0000 mg | DELAYED_RELEASE_TABLET | Freq: Two times a day (BID) | ORAL | Status: AC

## 2011-09-14 NOTE — Progress Notes (Signed)
Subjective: 2 Days Post-Op Procedure(s) (LRB): TOTAL HIP ARTHROPLASTY ANTERIOR APPROACH (Left)   Patient reports pain as mild, pain well controlled. No events throughout the night. Peeing a lot but that should resolve. Feels ready to be discharged home.  Objective:   VITALS:   Filed Vitals:   09/14/11 0600  BP: 101/59  Pulse: 70  Temp: 97.8 F (36.6 C)  Resp: 16    Neurovascular intact Dorsiflexion/Plantar flexion intact Incision: dressing C/D/I No cellulitis present Compartment soft  LABS  Basename 09/14/11 0420 09/13/11 0400  HGB 8.6* 8.9*  HCT 25.9* 26.8*  WBC 9.3 7.2  PLT 165 178     Basename 09/14/11 0420 09/13/11 0400  NA 138 138  K 4.0 4.6  BUN 11 10  CREATININE 0.64 0.69  GLUCOSE 132* 141*     Assessment/Plan: 2 Days Post-Op Procedure(s) (LRB): TOTAL HIP ARTHROPLASTY ANTERIOR APPROACH (Left)   Up with therapy Discharge home with home health Follow up in 2 weeks at [facility].  Follow-up Information    Follow up with OLIN,Safiatou Islam D in 2 weeks.   Contact information:   [facility] [address] [place] [address] [phone]          Anastasio Auerbach. Florabelle Cardin   PAC  09/14/2011, 7:34 AM

## 2011-09-14 NOTE — Progress Notes (Signed)
Physical Therapy Treatment Patient Details Name: Patricia Conley MRN: [id number] DOB: 01/07/1951 Today's Date: 09/14/2011 Time: 1010-1040 PT Time Calculation (min): 30 min  PT Assessment / Plan / Recommendation Comments on Treatment Session  No c/o dizziness,  Reviewed car transfers with pt    Follow Up Recommendations  Home health PT    Barriers to Discharge        Equipment Recommendations  Rolling walker with 5" wheels;3 in 1 bedside comode    Recommendations for Other Services OT consult  Frequency 7X/week   Plan Discharge plan remains appropriate    Precautions / Restrictions Precautions Precautions: None Restrictions Weight Bearing Restrictions: No Other Position/Activity Restrictions: WBAT   Pertinent Vitals/Pain Min c/o pain    Mobility  Bed Mobility Bed Mobility: Supine to Sit Supine to Sit: 5: Supervision;HOB elevated Transfers Transfers: Sit to Stand;Stand to Sit Sit to Stand: 5: Supervision;With upper extremity assist;From bed;From toilet Stand to Sit: 5: Supervision;With upper extremity assist;To toilet;To chair/3-in-1 Details for Transfer Assistance: min verbal cues for hand placement Ambulation/Gait Ambulation/Gait Assistance: 4: Min guard;5: Supervision Ambulation Distance (Feet): 300 Feet Assistive device: Rolling walker Ambulation/Gait Assistance Details: min cues for position from RW and saftey awareness with turns Gait Pattern: Step-to pattern;Step-through pattern Stairs: Yes Stairs Assistance: 4: Min assist Stairs Assistance Details (indicate cue type and reason): cues for sequence and foot/RW/crutch placment Stair Management Technique: No rails;One rail Right;With walker;With crutches;Step to pattern Number of Stairs: 6  (1 step fwd; 1 step bkwd and 4 step with rail and crutch)    Exercises     PT Diagnosis:    PT Problem List:   PT Treatment Interventions:     PT Goals Acute Rehab PT Goals PT Goal Formulation: With patient Time For  Goal Achievement: 09/19/11 Potential to Achieve Goals: Good Pt will go Supine/Side to Sit: with supervision Pt will go Sit to Stand: with supervision PT Goal: Sit to Stand - Progress: Progressing toward goal Pt will go Stand to Sit: with supervision PT Goal: Stand to Sit - Progress: Progressing toward goal Pt will Ambulate: 51 - 150 feet;with supervision;with rolling walker PT Goal: Ambulate - Progress: Progressing toward goal Pt will Go Up / Down Stairs: 1-2 stairs;with min assist;with least restrictive assistive device PT Goal: Up/Down Stairs - Progress: Met  Visit Information  Last PT Received On: 09/14/11 Assistance Needed: +1    Subjective Data  Subjective: [name] Patient Stated Goal: Resume previous active lifestyle with decreased pain   Cognition  Overall Cognitive Status: Appears within functional limits for tasks assessed/performed Arousal/Alertness: Awake/alert Orientation Level: Appears intact for tasks assessed Behavior During Session: [facility] for tasks performed    Balance     End of Session PT - End of Session Activity Tolerance: Patient tolerated treatment well Patient left: in chair;with call bell/phone within reach Nurse Communication: Mobility status;Other (comment)    Auron Tadros 09/14/2011, 12:17 PM

## 2011-09-14 NOTE — Evaluation (Signed)
Occupational Therapy Evaluation Patient Details Name: Patricia Conley MRN: [id number] DOB: [birth year] Today's Date: 09/14/2011 Time: 0865-7846 OT Time Calculation (min): 25 min  OT Assessment / Plan / Recommendation Clinical Impression  pt is s/p L direct anterior hip and is planning for discharge home today. All education completed and pt has assist at discharge. Will sign off for OT.    OT Assessment  Patient does not need any further OT services    Follow Up Recommendations  No OT follow up    Barriers to Discharge      Equipment Recommendations  Rolling walker with 5" wheels;3 in 1 bedside comode    Recommendations for Other Services    Frequency       Precautions / Restrictions Precautions Precautions: None Restrictions Weight Bearing Restrictions: No        ADL  Eating/Feeding: Simulated;Independent Where Assessed - Eating/Feeding: Edge of bed Grooming: Performed;Wash/dry hands;Minimal assistance;Other (comment) (min guard due to reports of lightheadedness) Where Assessed - Grooming: Unsupported standing Upper Body Bathing: Simulated;Chest;Right arm;Left arm;Abdomen;Set up Where Assessed - Upper Body Bathing: Unsupported sitting Lower Body Bathing: Simulated;Min guard Where Assessed - Lower Body Bathing: Supported sit to stand Upper Body Dressing: Simulated;Set up Where Assessed - Upper Body Dressing: Unsupported sitting Lower Body Dressing: Simulated;Min guard Where Assessed - Lower Body Dressing: Sopported sit to stand Toilet Transfer: Performed;Min guard Toilet Transfer Method: Other (comment) (ambulating) Acupuncturist: Comfort height toilet;Grab bars Toileting - Clothing Manipulation and Hygiene: Simulated;Min guard Where Assessed - Engineer, mining and Hygiene: Standing Tub/Shower Transfer: Simulated;Minimal assistance Tub/Shower Transfer Method: Other (comment) (step back over ledge) Equipment Used: Rolling walker ADL  Comments: Pt able to reach down to left foot and simulate don/doff sock already. Pt initially not sure if she needed 3in1 but after lengthy discussion about not using a towel rod to pull up on in her bathroom and where her sink/counter is located in relation to the toilet, decided it is safer to have 3in1 for home. Explained its use as a shower seat also. Pt was slightly dizzy with standing in the bathroom to talk about toilet set up at home but reports she is fine if she continue to move. Transferred to the recliner and then finished discussion. BP in chair 104/69. Nsg made aware.    OT Diagnosis:    OT Problem List:   OT Treatment Interventions:     OT Goals    Visit Information  Last OT Received On: 09/14/11 Assistance Needed: +1    Subjective Data  Subjective: I think my toilet is similar to the one in the bathroom Patient Stated Goal: home   Prior Functioning  Home Living Lives With: Daughter Available Help at Discharge: Family;Other (Comment) (sister) Type of Home: House Home Access: Stairs to enter [employer] of Steps: 1 (1+1) Entrance Stairs-Rails: None Home Layout: One level Bathroom Shower/Tub: Health visitor: Standard Home Adaptive Equipment: None Prior Function Level of Independence: Independent Able to Take Stairs?: Yes Driving: Yes Communication Communication: No difficulties    Cognition  Overall Cognitive Status: Appears within functional limits for tasks assessed/performed Arousal/Alertness: Awake/alert Orientation Level: Appears intact for tasks assessed Behavior During Session: [facility] for tasks performed    Extremity/Trunk Assessment Right Upper Extremity Assessment RUE ROM/Strength/Tone: Within functional levels Left Upper Extremity Assessment LUE ROM/Strength/Tone: Within functional levels   Mobility Bed Mobility Bed Mobility: Supine to Sit Supine to Sit: 5: Supervision;HOB elevated Transfers Transfers: Sit to Stand;Stand  to Sit Sit to  Stand: 5: Supervision;With upper extremity assist;From bed;From toilet Stand to Sit: 5: Supervision;With upper extremity assist;To toilet;To chair/3-in-1 Details for Transfer Assistance: min verbal cues for hand placement   Exercise    Balance    End of Session OT - End of Session Activity Tolerance: Patient tolerated treatment well;Other (comment) (slight dizziness if standing still, better if moving per pt) Patient left: in chair;with call bell/phone within reach   Lennox Laity [phone] 09/14/2011, 10:54 AM

## 2011-09-14 NOTE — Care Management Note (Signed)
    Page 1 of 2   09/14/2011     4:12:28 PM   CARE MANAGEMENT NOTE 09/14/2011  Patient:  Patricia Conley, Patricia Conley   Account Number:  [id number]  Date Initiated:  09/14/2011  Documentation initiated by:  Colleen Can  Subjective/Objective Assessment:   dx left hip osteoarthritis; total hip replacemnt -anterior approach     Action/Plan:   CM spoke with patient. Plans are for patient to return to her home in [place] where her sister will be caregiver. She chose [place] for [facility] services. They will arrrange for DME   Anticipated DC Date:  09/14/2011   Anticipated DC Plan:  HOME W HOME HEALTH SERVICES  In-house referral  NA      DC Planning Services  CM consult      [facility] Choice  HOME HEALTH   Choice offered to / List presented to:  C-1 Patient   DME arranged  NA      DME agency  NA     HH arranged  HH-2 PT      [facility] agency  [facility]   Status of service:  Completed, signed off Medicare Important Message given?  NO (If response is "NO", the following Medicare IM given date fields will be blank) Date Medicare IM given:   Date Additional Medicare IM given:    Discharge Disposition:  HOME W HOME HEALTH SERVICES  Per UR Regulation:    If discussed at Long Length of Stay Meetings, dates discussed:    Comments:  09/14/2011 Raynelle Bring BSN CCM [phone] List of [facility] agencies placed on shadow chart. Pt for discharge today. Genevieve Norlander will start [facility] services tomorrow 09/15/2011

## 2011-09-14 NOTE — Progress Notes (Signed)
Discharge summary sent to payer through [facility]  

## 2011-09-15 NOTE — Discharge Summary (Signed)
Physician Discharge Summary  Patient ID: Patricia Conley MRN: [id number] DOB/AGE: 59-31-1952 59 y.o.  Admit date: 09/12/2011 Discharge date: 09/14/2011  Procedures:  Procedure(s) (LRB): TOTAL HIP ARTHROPLASTY ANTERIOR APPROACH (Left)  Attending Physician:  Dr. Durene Romans   Admission Diagnoses: End-stage osteoarthritis, left hip  Discharge Diagnoses:  Principal Problem:  *S/P left THA, AA GERD  HPI: This is a 61 year old lady with a history of end-stage osteoarthritis of her left hip which has failed conservative treatment. After discussion of treatments, benefits, risks, and options, the patient is now scheduled for total hip arthroplasty by anterior approach, left hip. Note that she is a  candidate for tranexamic acid and will receive that at surgery as well. She is a candidate for dexamethasone and will receive that. She is planning on going home after surgery. She was given her home medications today of aspirin, Robaxin, iron, MiraLax, and Colace.  PCP: Levon Hedger, MD, MD   Discharged Condition: good  Hospital Course:  Patient underwent the above stated procedure on 09/12/2011. Patient tolerated the procedure well and brought to the recovery room in good condition and subsequently to the floor.  POD #1 BP: 91/57 ; Pulse: 79 ; Temp: 98.5 F (36.9 C) ; Resp: 12  Pt's foley was removed, as well as the hemovac drain removed. IV was changed to a saline lock. Patient reports pain as mild, well controlled with medication. C/O muscle pain. No events throughout the night. Spent some time discussing the case, findings and expectation for the patient.   LABS  Basename  09/13/11 0400   HGB  8.9  HCT  26.8    POD #2  BP: 101/59 ; Pulse: 70 ; Temp: 97.8 F (36.6 C) ; Resp: 16  Patient reports pain as mild, pain well controlled. No events throughout the night. Peeing a lot but that should resolve. Feels ready to be discharged home. Neurovascular intact, dorsiflexion/plantar  flexion intact, incision: dressing C/D/I, no cellulitis present and compartment soft.   LABS  Basename  09/14/11 0420  HGB  8.6  HCT  25.9    Discharge Exam: General appearance: alert, cooperative and no distress Extremities: Homans sign is negative, no sign of DVT, no edema, redness or tenderness in the calves or thighs and no ulcers, gangrene or trophic changes  Disposition:  Home  with follow up in 2 weeks  Follow-up Information    Follow up with OLIN,Mohit Zirbes D in 2 weeks.   Contact information:   [facility] [address] [place] [address] [phone]          Discharge Orders    Future Orders Please Complete By Expires   Diet - low sodium heart healthy      Call MD / Call 911      Comments:   If you experience chest pain or shortness of breath, CALL 911 and be transported to the hospital emergency room.  If you develope a fever above 101 F, pus (white drainage) or increased drainage or redness at the wound, or calf pain, call your surgeon's office.   Discharge instructions      Comments:   Maintain surgical dressing for 8 days, then replace with gauze and tape. Keep the area dry and clean until follow up. Follow up in 2 weeks at [facility] Call with any questions or concerns.     Constipation Prevention      Comments:   Drink plenty of fluids.  Prune juice may be helpful.  You may use a stool softener, such as Colace (over the counter) 100 mg twice a day.  Use MiraLax (over the counter) for constipation as needed.   Increase activity slowly as tolerated      Driving restrictions      Comments:   No driving for 4 weeks   Change dressing      Comments:   Maintain surgical dressing for 8 days, then replace with 4x4 guaze and tape. Keep the area dry and clean.   TED hose      Comments:   Use stockings (TED hose) for 2 weeks on both leg(s).  You may remove them at night for sleeping.      Discharge  Medication List as of 09/14/2011 11:24 AM    START taking these medications   Details  aspirin EC 325 MG tablet Take 1 tablet (325 mg total) by mouth 2 (two) times daily. X 4 weeks, Starting 09/14/2011, Until Sun 09/24/11, No Print    diphenhydrAMINE (BENADRYL) 25 mg capsule Take 1 capsule (25 mg total) by mouth every 6 (six) hours as needed for itching, allergies or sleep., Starting 09/14/2011, Until Sun 09/24/11, No Print    docusate sodium 100 MG CAPS Take 100 mg by mouth 2 (two) times daily., Starting 09/14/2011, Until Sun 09/24/11, No Print    ferrous sulfate 325 (65 FE) MG tablet Take 1 tablet (325 mg total) by mouth 3 (three) times daily after meals., Starting 09/14/2011, Until Fri 09/13/12, No Print    HYDROcodone-acetaminophen (NORCO) 7.5-325 MG per tablet Take 1-2 tablets by mouth every 4 (four) hours as needed for pain., Starting 09/14/2011, Until Sun 09/24/11, Print    methocarbamol (ROBAXIN) 500 MG tablet Take 1 tablet (500 mg total) by mouth every 6 (six) hours as needed (muscle spasms)., Starting 09/14/2011, Until Sun 09/24/11, No Print    polyethylene glycol (MIRALAX / GLYCOLAX) packet Take 17 g by mouth 2 (two) times daily., Starting 09/14/2011, Until Sun 09/17/11, No Print      CONTINUE these medications which have CHANGED   Details  celecoxib (CELEBREX) 100 MG capsule Take 1 capsule (100 mg total) by mouth 2 (two) times daily., Starting 09/14/2011, Until Discontinued, No Print      CONTINUE these medications which have NOT CHANGED   Details  b complex vitamins capsule Take 1 capsule by mouth daily.  , Until Discontinued, Historical Med    Glucos-MSM-C-Mn-Ginger-Willow (BL MSM GLUCOSAMINE COMPLEX PO) Take by mouth daily., Until Discontinued, Historical Med    Multiple Vitamin (MULITIVITAMIN WITH MINERALS) TABS Take 1 tablet by mouth daily with breakfast., Until Discontinued, Historical Med      STOP taking these medications     aspirin-acetaminophen-caffeine (EXCEDRIN MIGRAINE)  250-250-65 MG per tablet Comments:  Reason for Stopping:            Signed: Anastasio Auerbach. Masahiro Iglesia   PAC  09/15/2011, 8:39 AM

## 2011-11-14 ENCOUNTER — Ambulatory Visit: Payer: BC Managed Care – PPO | Attending: Orthopedic Surgery | Admitting: Physical Therapy

## 2011-11-14 DIAGNOSIS — M6281 Muscle weakness (generalized): Secondary | ICD-10-CM | POA: Insufficient documentation

## 2011-11-14 DIAGNOSIS — Z96649 Presence of unspecified artificial hip joint: Secondary | ICD-10-CM | POA: Insufficient documentation

## 2011-11-14 DIAGNOSIS — IMO0001 Reserved for inherently not codable concepts without codable children: Secondary | ICD-10-CM | POA: Insufficient documentation

## 2011-11-14 DIAGNOSIS — R269 Unspecified abnormalities of gait and mobility: Secondary | ICD-10-CM | POA: Insufficient documentation

## 2011-11-18 ENCOUNTER — Ambulatory Visit (INDEPENDENT_AMBULATORY_CARE_PROVIDER_SITE_OTHER): Payer: BC Managed Care – PPO | Admitting: Family Medicine

## 2011-11-18 ENCOUNTER — Ambulatory Visit: Payer: BC Managed Care – PPO

## 2011-11-18 VITALS — BP 123/76 | HR 80 | Temp 98.4°F | Resp 16 | Ht 67.0 in | Wt 128.4 lb

## 2011-11-18 DIAGNOSIS — R0602 Shortness of breath: Secondary | ICD-10-CM

## 2011-11-18 DIAGNOSIS — R5383 Other fatigue: Secondary | ICD-10-CM

## 2011-11-18 DIAGNOSIS — R5381 Other malaise: Secondary | ICD-10-CM

## 2011-11-18 LAB — COMPREHENSIVE METABOLIC PANEL
AST: 25 U/L (ref 0–37)
Albumin: 4.7 g/dL (ref 3.5–5.2)
BUN: 16 mg/dL (ref 6–23)
CO2: 29 mEq/L (ref 19–32)
Calcium: 9.5 mg/dL (ref 8.4–10.5)
Chloride: 104 mEq/L (ref 96–112)
Creat: 0.95 mg/dL (ref 0.50–1.10)
Glucose, Bld: 104 mg/dL — ABNORMAL HIGH (ref 70–99)
Potassium: 4.1 mEq/L (ref 3.5–5.3)

## 2011-11-18 LAB — POCT CBC
HCT, POC: 43.5 % (ref 37.7–47.9)
Hemoglobin: 13.6 g/dL (ref 12.2–16.2)
Lymph, poc: 1.4 (ref 0.6–3.4)
MCH, POC: 28.6 pg (ref 27–31.2)
MCHC: 31.3 g/dL — AB (ref 31.8–35.4)
MCV: 91.4 fL (ref 80–97)
MPV: 9.3 fL (ref 0–99.8)
POC MID %: 4.9 %M (ref 0–12)
RBC: 4.76 M/uL (ref 4.04–5.48)
WBC: 6.4 10*3/uL (ref 4.6–10.2)

## 2011-11-18 LAB — AMYLASE: Amylase: 43 U/L (ref 0–105)

## 2011-11-18 LAB — TSH: TSH: 0.528 u[IU]/mL (ref 0.350–4.500)

## 2011-11-18 IMAGING — CR DG CHEST 2V
2 series · 2 of 2 positions shown · non-contrast
Comparison: Preliminary reading of Dr. NII AKWEI

CLINICAL DATA: Weakness, weight loss, shortness of breath

CHEST - 2 VIEW

[PA]
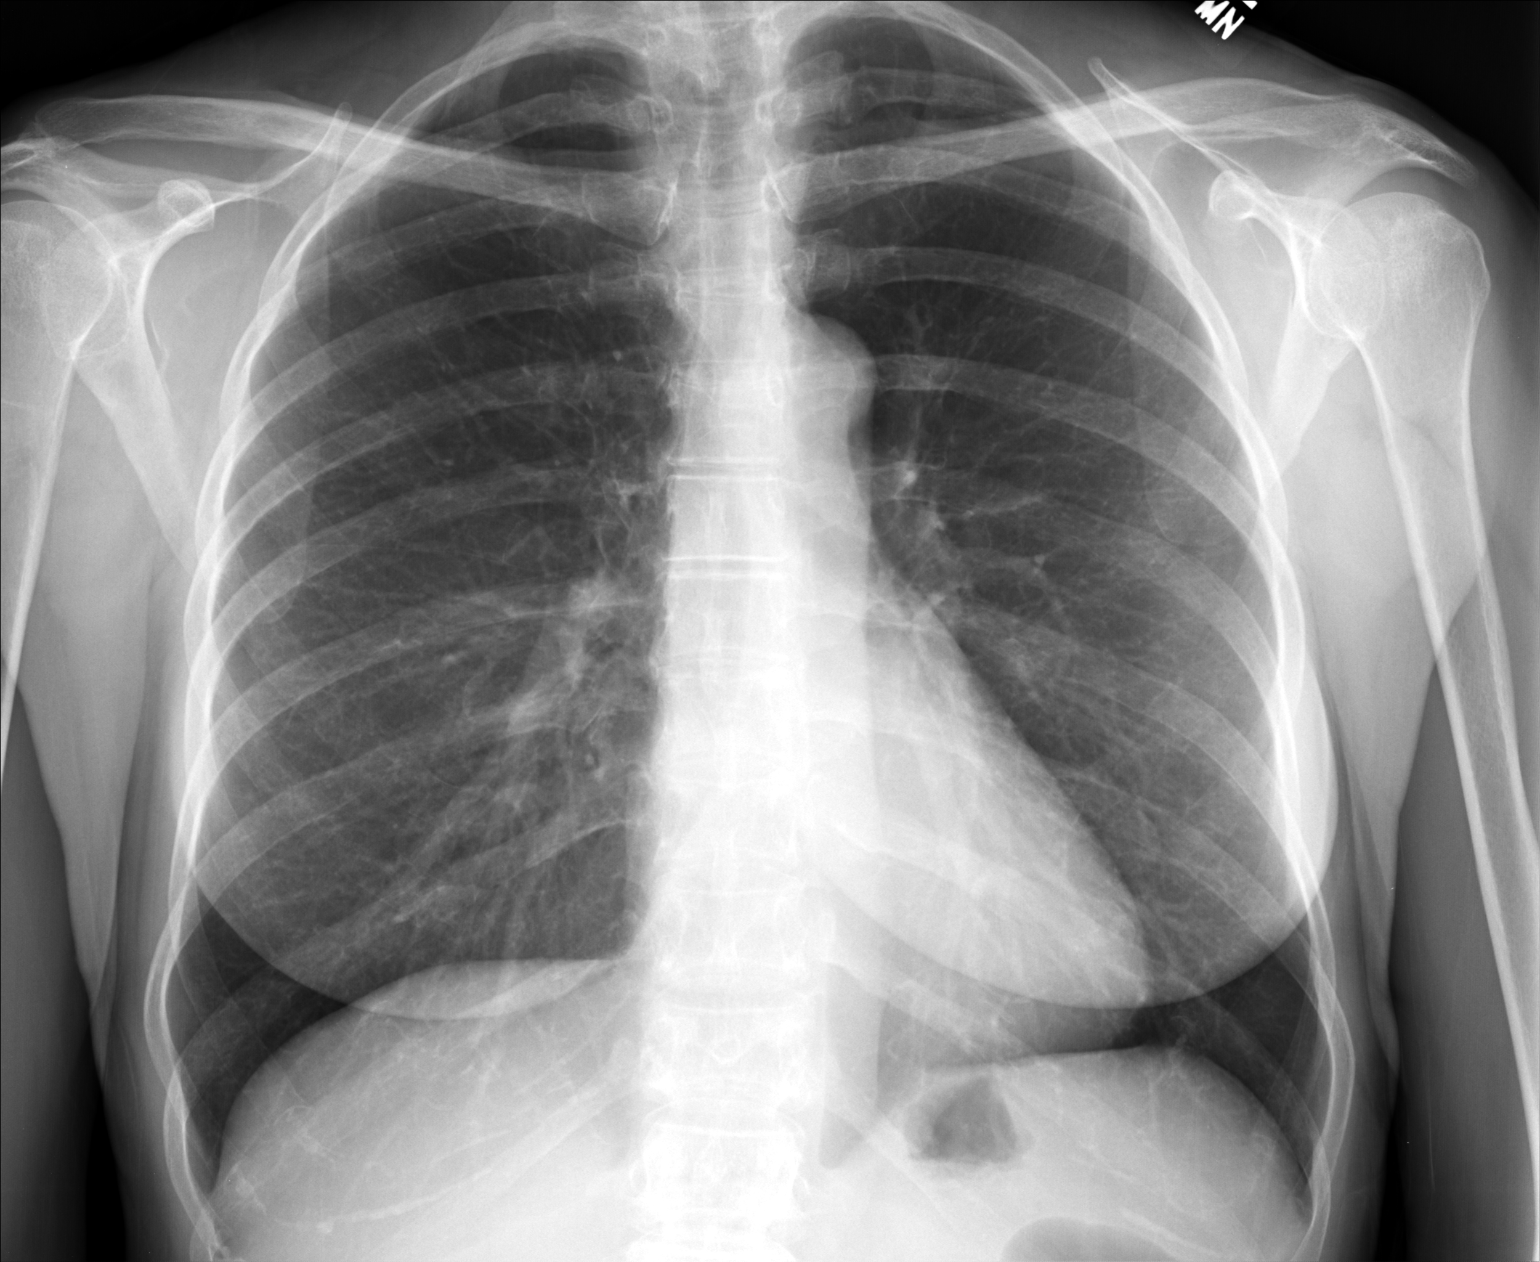

[lateral]
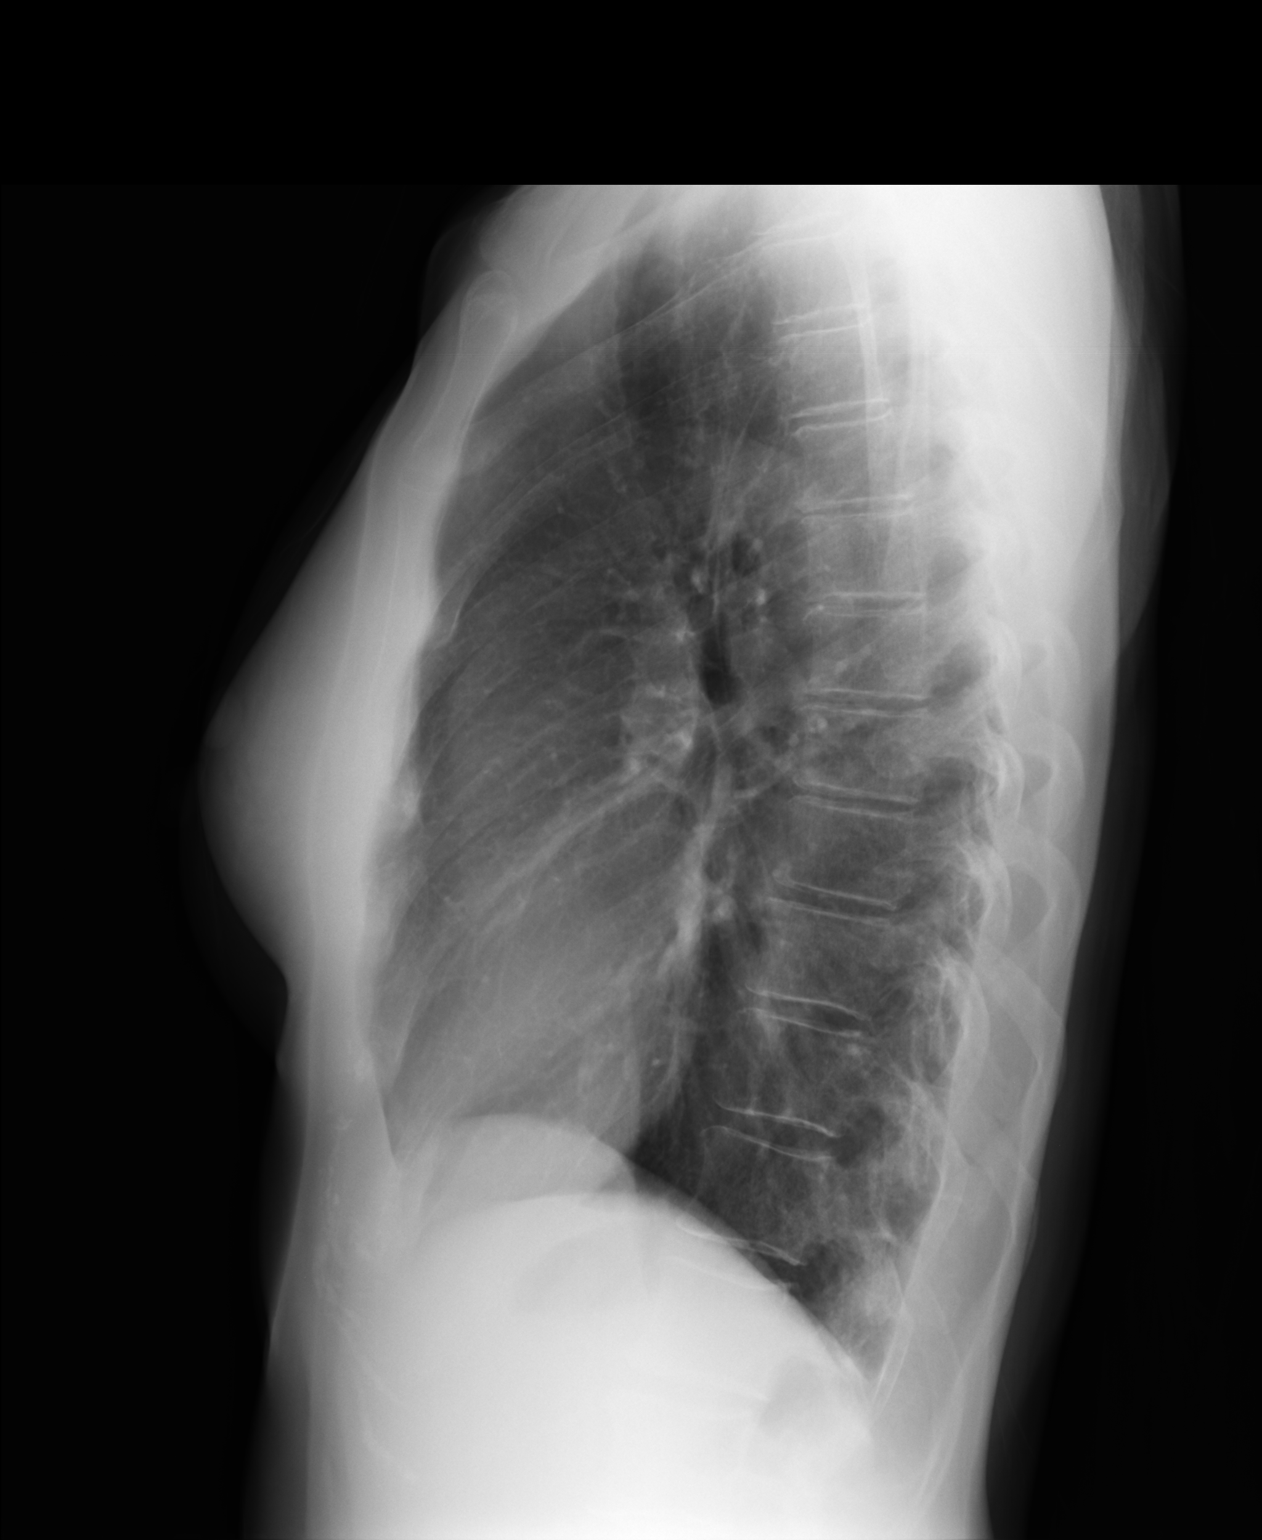

[2 of 2 positions shown; findings below may reference images not displayed]

FINDINGS: Silhouette is unremarkable.  No acute infiltrate or pleural
effusion.  No pulmonary edema.  Bony thorax is unremarkable.
IMPRESSION: No active disease.

Clinically significant discrepancy from primary report, if
provided: None

## 2011-11-18 NOTE — Progress Notes (Signed)
[facility] [address] [place] [address] [phone]  Date:  11/18/2011   Name:  Patricia Conley   DOB:  [birth year]   MRN:  [id number]  PCP:  Levon Hedger, MD    Chief Complaint: Fatigue, Weight Loss, Shortness of Breath and Hot Flashes   History of Present Illness:  Patricia Conley is a 61 y.o. very pleasant female patient who presents with the following:  She had a left total hip done in May- it took her about one month to feel that she was like her old self after her surgery. However, she did finally feel better- until about 2 weeks ago when she started to feel very tired.   Her weight had been about 133 lbs- she noted that she had lost about 5 lbs over the last month.    [name] has also noted some SOB last week.  Feels difficult to take a keep breath.  Has not really been coughing. No history of tobacco, no history of PE or DVT. She has noted soreness in the heels of both hands.  She has not noted a fever.  She has noted some hot flashes- she went through menopause about 8 years ago and has not had hot flashes in some time.  No night sweats, no vomiting or diarrhea.  She had noted some nausea.    Appetite is ok.    She is UTD on her mammogram.  She last had a colonoscopy at age 61.  She does still have her uterus and ovaries.  She has not been SA in over a year.    She is NOT fasting today.    Patient Active Problem List  Diagnosis  . Benign breast cyst in female  . Menopause  . Osteopenia  . Gall bladder disease  . Renal cyst  . Adrenal nodule  . Liver hemangioma  . Hemorrhoids, external without complications  . S/P left THA, AA    Past Medical History  Diagnosis Date  . Menopause   . Osteopenia   . Gall bladder disease     per pt report  . GERD (gastroesophageal reflux disease)   . Hemorrhoids   . Arthritis     Past Surgical History  Procedure Date  . Nasal sinus surgery 1995, 2002  . Cystectomy 1999    Right elbow  .  Breast surgery 2002    cyst on Left breast  . Total hip arthroplasty 09/12/2011    Procedure: TOTAL HIP ARTHROPLASTY ANTERIOR APPROACH;  Surgeon: [name], MD;  Location: [facility];  Service: Orthopedics;  Laterality: Left;    History  Substance Use Topics  . Smoking status: Never Smoker   . Smokeless tobacco: Never Used  . Alcohol Use: 1.0 oz/week    2 drink(s) per week    Family History  Problem Relation Age of Onset  . Stroke Mother   . Nephrolithiasis Mother   . Diabetes Mother   . Aneurysm Mother     x2  . Cancer Mother     lymphoma, breast cancer  . Depression Father   . Parkinsonism Father   . Drug abuse Father   . Mental illness Sister     paranoia    No Known Allergies  Medication list has been reviewed and updated.  Current Outpatient Prescriptions on File Prior to Visit  Medication Sig Dispense Refill  . b complex vitamins capsule Take 1 capsule by mouth daily.        Marland Kitchen  Multiple Vitamin (MULITIVITAMIN WITH MINERALS) TABS Take 1 tablet by mouth daily with breakfast.      . celecoxib (CELEBREX) 100 MG capsule Take 1 capsule (100 mg total) by mouth 2 (two) times daily.      . diphenhydrAMINE (BENADRYL) 25 mg capsule Take 1 capsule (25 mg total) by mouth every 6 (six) hours as needed for itching, allergies or sleep.      . ferrous sulfate 325 (65 FE) MG tablet Take 1 tablet (325 mg total) by mouth 3 (three) times daily after meals.      . Glucos-MSM-C-Mn-Ginger-Willow (BL MSM GLUCOSAMINE COMPLEX PO) Take by mouth daily.        Review of Systems:  As per HPI- otherwise negative.   Physical Examination: Filed Vitals:   11/18/11 1126  BP: 123/76  Pulse: 80  Temp: 98.4 F (36.9 C)  Resp: 16   Filed Vitals:   11/18/11 1126  Height: 5\' 7"  (1.702 m)  Weight: 128 lb 6.4 oz (58.242 kg)   Body mass index is 20.11 kg/(m^2). Ideal Body Weight: Weight in (lb) to have BMI = 25: 159.3   GEN: WDWN, NAD, Non-toxic, A & O x 3 HEENT: Atraumatic, Normocephalic.  Neck supple. No masses, No LAD.  PEERL, EOMI, TM and oropharynx wnl.   Ears and Nose: No external deformity. CV: RRR, No M/G/R. No JVD. No thrill. No extra heart sounds. PULM: CTA B, no wheezes, crackles, rhonchi. No retractions. No resp. distress. No accessory muscle use. ABD: S, NT, ND, +BS. No rebound. No HSM. EXTR: No c/c/e.  The IP joints in both hands are mildly puffy- may be consistent with chronic OA. Otherwise joints are normal, no calf swelling or tenderness.  NEURO Normal gait.  PSYCH: Normally interactive. Conversant. Not depressed or anxious appearing.  Calm demeanor.   [facility] reading (PRIMARY) by  Dr. Patsy Lager.  Negative chest x-ray  CHEST - 2 VIEW  Comparison: Preliminary reading of Dr. Patsy Lager  Findings: Silhouette is unremarkable. No acute infiltrate or pleural effusion. No pulmonary edema. Bony thorax is unremarkable.  IMPRESSION: No active disease.   Results for orders placed in visit on 11/18/11  POCT CBC      Component Value Range   WBC 6.4  4.6 - 10.2 K/uL   Lymph, poc 1.4  0.6 - 3.4   POC LYMPH PERCENT 21.2  10 - 50 %L   MID (cbc) 0.3  0 - 0.9   POC MID % 4.9  0 - 12 %M   POC Granulocyte 4.7  2 - 6.9   Granulocyte percent 73.9  37 - 80 %G   RBC 4.76  4.04 - 5.48 M/uL   Hemoglobin 13.6  12.2 - 16.2 g/dL   HCT, POC 16.1  09.6 - 47.9 %   MCV 91.4  80 - 97 fL   MCH, POC 28.6  27 - 31.2 pg   MCHC 31.3 (*) 31.8 - 35.4 g/dL   RDW, POC 04.5     Platelet Count, POC 269  142 - 424 K/uL   MPV 9.3  0 - 99.8 fL  GLUCOSE, POCT (MANUAL RESULT ENTRY)      Component Value Range   POC Glucose 115 (*) 70 - 99 mg/dl  POCT SEDIMENTATION RATE      Component Value Range   POCT SED RATE 3  0 - 22 mm/hr    Assessment and Plan: 1. Fatigue  POCT CBC, POCT glucose (manual entry), Comprehensive metabolic panel, DG Chest 2 View,  TSH, POCT SEDIMENTATION RATE, Amylase, ANA  2. SOB (shortness of breath)  D-dimer, quantitative   Lynden Ang has noted fatigue and lack of well- being  for a few weeks.  Will check a D- Dimer today as she recently had surgery. However, her VS are reassuring that she does not have a PE.  Will call her this afternoon with D-Dimer result- then plan further follow- up pending the rest of her labs  COPLAND,JESSICA, MD

## 2011-11-20 ENCOUNTER — Encounter: Payer: Self-pay | Admitting: Family Medicine

## 2011-11-20 ENCOUNTER — Telehealth: Payer: Self-pay | Admitting: Radiology

## 2011-11-20 ENCOUNTER — Encounter: Payer: BC Managed Care – PPO | Admitting: Physical Therapy

## 2011-11-20 LAB — ANA: Anti Nuclear Antibody(ANA): NEGATIVE

## 2011-11-20 NOTE — Progress Notes (Signed)
Have mailed to patient and have also called her to advise, had to leave mssg

## 2011-11-20 NOTE — Telephone Encounter (Signed)
I have mailed patient a copy of her labs per Dr Patsy Lager, I have left message for her to call back to see how she is feeling and let her know all labs normal. If she is not improving or getting worse she may need further evaluation

## 2011-11-23 NOTE — Telephone Encounter (Signed)
Spoke w/pt and gave her lab results. She reported that she was feeling better but then more fatigued again - "it seems to come and go". Advised pt that if she continues to have Sxs persist that she should RTC. Pt agreed.

## 2011-11-27 ENCOUNTER — Ambulatory Visit: Payer: BC Managed Care – PPO | Attending: Orthopedic Surgery | Admitting: Physical Therapy

## 2011-11-27 DIAGNOSIS — R269 Unspecified abnormalities of gait and mobility: Secondary | ICD-10-CM | POA: Insufficient documentation

## 2011-11-27 DIAGNOSIS — Z96649 Presence of unspecified artificial hip joint: Secondary | ICD-10-CM | POA: Insufficient documentation

## 2011-11-27 DIAGNOSIS — IMO0001 Reserved for inherently not codable concepts without codable children: Secondary | ICD-10-CM | POA: Insufficient documentation

## 2011-11-27 DIAGNOSIS — M6281 Muscle weakness (generalized): Secondary | ICD-10-CM | POA: Insufficient documentation

## 2011-12-05 ENCOUNTER — Encounter: Payer: BC Managed Care – PPO | Admitting: Physical Therapy

## 2011-12-07 ENCOUNTER — Ambulatory Visit: Payer: BC Managed Care – PPO | Admitting: Physical Therapy

## 2011-12-11 ENCOUNTER — Encounter: Payer: BC Managed Care – PPO | Admitting: Physical Therapy

## 2011-12-21 ENCOUNTER — Ambulatory Visit: Payer: BC Managed Care – PPO | Admitting: Physical Therapy

## 2012-04-09 ENCOUNTER — Ambulatory Visit (INDEPENDENT_AMBULATORY_CARE_PROVIDER_SITE_OTHER): Payer: BC Managed Care – PPO | Admitting: Family Medicine

## 2012-04-09 VITALS — BP 120/72 | HR 72 | Temp 98.0°F | Resp 16 | Ht 67.0 in | Wt 133.0 lb

## 2012-04-09 DIAGNOSIS — L739 Follicular disorder, unspecified: Secondary | ICD-10-CM

## 2012-04-09 DIAGNOSIS — L738 Other specified follicular disorders: Secondary | ICD-10-CM

## 2012-04-09 MED ORDER — CEPHALEXIN 500 MG PO CAPS: 1000.0000 mg | ORAL_CAPSULE | Freq: Two times a day (BID) | ORAL | Status: DC

## 2012-04-09 NOTE — Patient Instructions (Addendum)
Use the antibiotic as directed.  Apply hot compresses (or take a hot bath) a couple of times a day for the next few days. If the area is not resolving please let me know- Sooner if worse.

## 2012-04-09 NOTE — Progress Notes (Signed)
[facility] [address], [place] [address] [phone]  Date:  04/09/2012   Name:  Patricia Conley   DOB:  [birth year]   MRN:  [id number]  PCP:  Levon Hedger, MD    Chief Complaint: Mass   History of Present Illness:  Patricia Conley is a 61 y.o. very pleasant female patient who presents with the following:  She is here with an inflamed area on her leg- she has noted a ?infected hair follicle on the left inner thigh for the last 3 or 4 days.  She is concerned as she had a left hip replacement a few months ago.  The area has not drained, and she has no systemic symptoms such as a fever or chills.  otherwise she feels well today  Patient Active Problem List  Diagnosis  . Benign breast cyst in female  . Menopause  . Osteopenia  . Gall bladder disease  . Renal cyst  . Adrenal nodule  . Liver hemangioma  . Hemorrhoids, external without complications  . S/P left THA, AA    Past Medical History  Diagnosis Date  . Menopause   . Osteopenia   . Gall bladder disease     per pt report  . GERD (gastroesophageal reflux disease)   . Hemorrhoids   . Arthritis     Past Surgical History  Procedure Date  . Nasal sinus surgery 1995, 2002  . Cystectomy 1999    Right elbow  . Breast surgery 2002    cyst on Left breast  . Total hip arthroplasty 09/12/2011    Procedure: TOTAL HIP ARTHROPLASTY ANTERIOR APPROACH;  Surgeon: [name], MD;  Location: [facility];  Service: Orthopedics;  Laterality: Left;  . Joint replacement     History  Substance Use Topics  . Smoking status: Never Smoker   . Smokeless tobacco: Never Used  . Alcohol Use: 1.0 oz/week    2 drink(s) per week    Family History  Problem Relation Age of Onset  . Stroke Mother   . Nephrolithiasis Mother   . Diabetes Mother   . Aneurysm Mother     x2  . Cancer Mother     lymphoma, breast cancer  . Depression Father   . Parkinsonism Father   . Drug abuse Father   . Mental illness  Sister     paranoia    No Known Allergies  Medication list has been reviewed and updated.  Current Outpatient Prescriptions on File Prior to Visit  Medication Sig Dispense Refill  . Glucos-MSM-C-Mn-Ginger-Willow (BL MSM GLUCOSAMINE COMPLEX PO) Take by mouth daily.      . Multiple Vitamin (MULITIVITAMIN WITH MINERALS) TABS Take 1 tablet by mouth daily with breakfast.      . b complex vitamins capsule Take 1 capsule by mouth daily.        . celecoxib (CELEBREX) 100 MG capsule Take 1 capsule (100 mg total) by mouth 2 (two) times daily.      . diphenhydrAMINE (BENADRYL) 25 mg capsule Take 1 capsule (25 mg total) by mouth every 6 (six) hours as needed for itching, allergies or sleep.      . ferrous sulfate 325 (65 FE) MG tablet Take 1 tablet (325 mg total) by mouth 3 (three) times daily after meals.      Marland Kitchen omeprazole-sodium bicarbonate (ZEGERID) 40-1100 MG per capsule Take 1 capsule by mouth daily before breakfast.  30 capsule  1    Review of  Systems:  As per HPI- otherwise negative.   Physical Examination: Filed Vitals:   04/09/12 0812  BP: 120/72  Pulse: 72  Temp: 98 F (36.7 C)  Resp: 16   Filed Vitals:   04/09/12 0812  Height: 5\' 7"  (1.702 m)  Weight: 133 lb (60.328 kg)   Body mass index is 20.83 kg/(m^2). Ideal Body Weight: Weight in (lb) to have BMI = 25: 159.3   GEN: WDWN, NAD, Non-toxic, A & O x 3 HEENT: Atraumatic, Normocephalic. Neck supple. No masses, No LAD. Ears and Nose: No external deformity. CV: RRR, No M/G/R. No JVD. No thrill. No extra heart sounds. PULM: CTA B, no wheezes, crackles, rhonchi. No retractions. No resp. distress. No accessory muscle use. ABD: S, NT, ND EXTR: No c/c/e NEURO Normal gait.  PSYCH: Normally interactive. Conversant. Not depressed or anxious appearing.  Calm demeanor.  Left inner thigh- there is a small, firm, indurated area that appears consistent with an infected hair follicle  VC obtained;  Area cleaned with alcohol pad and  anesthesia given with 1ml of 1% lidocaine.  Prepped with betadine and small incision made with 11 blade.  Squeezed area gently and expressed pus.  Dressed with band- aid.  No packing needed  Assessment and Plan: 1. Folliculitis  cephALEXin (KEFLEX) 500 MG capsule   S/p I and D.  Treat with keflex See patient instructions for more details.     Abbe Amsterdam, MD

## 2012-04-12 ENCOUNTER — Ambulatory Visit (INDEPENDENT_AMBULATORY_CARE_PROVIDER_SITE_OTHER): Payer: BC Managed Care – PPO | Admitting: Family Medicine

## 2012-04-12 ENCOUNTER — Encounter: Payer: Self-pay | Admitting: Family Medicine

## 2012-04-12 VITALS — BP 110/70 | HR 89 | Temp 99.3°F | Resp 16 | Ht 67.0 in | Wt 134.0 lb

## 2012-04-12 DIAGNOSIS — R509 Fever, unspecified: Secondary | ICD-10-CM

## 2012-04-12 DIAGNOSIS — R059 Cough, unspecified: Secondary | ICD-10-CM

## 2012-04-12 DIAGNOSIS — J111 Influenza due to unidentified influenza virus with other respiratory manifestations: Secondary | ICD-10-CM

## 2012-04-12 DIAGNOSIS — R05 Cough: Secondary | ICD-10-CM

## 2012-04-12 MED ORDER — HYDROCODONE-HOMATROPINE 5-1.5 MG/5ML PO SYRP: 5.0000 mL | ORAL_SOLUTION | ORAL | Status: DC | PRN

## 2012-04-12 MED ORDER — OSELTAMIVIR PHOSPHATE 75 MG PO CAPS: 75.0000 mg | ORAL_CAPSULE | Freq: Two times a day (BID) | ORAL | Status: DC

## 2012-04-12 NOTE — Patient Instructions (Signed)
Influenza, Adult Influenza ("the flu") is a viral infection of the respiratory tract. It occurs more often in winter months because people spend more time in close contact with one another. Influenza can make you feel very sick. Influenza easily spreads from person to person (contagious). CAUSES   Influenza is caused by a virus that infects the respiratory tract. You can catch the virus by breathing in droplets from an infected person's cough or sneeze. You can also catch the virus by touching something that was recently contaminated with the virus and then touching your mouth, nose, or eyes. SYMPTOMS   Symptoms typically last 4 to 10 days and may include:  Fever.   Chills.   Headache, body aches, and muscle aches.   Sore throat.   Chest discomfort and cough.   Poor appetite.   Weakness or feeling tired.   Dizziness.   Nausea or vomiting.  DIAGNOSIS   Diagnosis of influenza is often made based on your history and a physical exam. A nose or throat swab test can be done to confirm the diagnosis. RISKS AND COMPLICATIONS You may be at risk for a more severe case of influenza if you smoke cigarettes, have diabetes, have chronic heart disease (such as heart failure) or lung disease (such as asthma), or if you have a weakened immune system. Elderly people and pregnant women are also at risk for more serious infections. The most common complication of influenza is a lung infection (pneumonia). Sometimes, this complication can require emergency medical care and may be life-threatening. PREVENTION   An annual influenza vaccination (flu shot) is the best way to avoid getting influenza. An annual flu shot is now routinely recommended for all adults in the [place] TREATMENT   In mild cases, influenza goes away on its own. Treatment is directed at relieving symptoms. For more severe cases, your caregiver may prescribe antiviral medicines to shorten the sickness. Antibiotic medicines are not effective,  because the infection is caused by a virus, not by bacteria. HOME CARE INSTRUCTIONS  Only take over-the-counter or prescription medicines for pain, discomfort, or fever as directed by your caregiver.   Use a cool mist humidifier to make breathing easier.   Get plenty of rest until your temperature returns to normal. This usually takes 3 to 4 days.   Drink enough fluids to keep your urine clear or pale yellow.   Cover your mouth and nose when coughing or sneezing, and wash your hands well to avoid spreading the virus.   Stay home from work or school until your fever has been gone for at least 1 full day.  SEEK MEDICAL CARE IF:    You have chest pain or a deep cough that worsens or produces more mucus.   You have nausea, vomiting, or diarrhea.  SEEK IMMEDIATE MEDICAL CARE IF:    You have difficulty breathing, shortness of breath, or your skin or nails turn bluish.   You have severe neck pain or stiffness.   You have a severe headache, facial pain, or earache.   You have a worsening or recurring fever.   You have nausea or vomiting that cannot be controlled.  MAKE SURE YOU:  Understand these instructions.   Will watch your condition.   Will get help right away if you are not doing well or get worse.  Document Released: 04/07/2000 Document Revised: 10/10/2011 Document Reviewed: 07/10/2011 Exit[employer] Patient Information 2013 [employer].    

## 2012-04-12 NOTE — Progress Notes (Signed)
Subjective: Patient did not get her flu shot this year. Starting last night she's been achy and running a low-grade fever. Today she feels worse she feels terrible she wants to lay down she hasn't had a headache. When she does take a deep breath it makes her cough.  Objective: Looks ill. TMs normal. Throat clear. Neck supple without nodes. Chest clear. Heart regular. Skin is very warm to touch.  Assessment:  Cough Fever Consistent with influenza  Plan: Influenza test

## 2012-04-30 ENCOUNTER — Other Ambulatory Visit: Payer: Self-pay | Admitting: Internal Medicine

## 2012-04-30 ENCOUNTER — Ambulatory Visit (INDEPENDENT_AMBULATORY_CARE_PROVIDER_SITE_OTHER): Payer: BC Managed Care – PPO | Admitting: Internal Medicine

## 2012-04-30 ENCOUNTER — Encounter: Payer: Self-pay | Admitting: Internal Medicine

## 2012-04-30 ENCOUNTER — Telehealth: Payer: Self-pay | Admitting: *Deleted

## 2012-04-30 VITALS — BP 126/79 | HR 84 | Temp 97.5°F | Resp 20 | Wt 131.4 lb

## 2012-04-30 DIAGNOSIS — N898 Other specified noninflammatory disorders of vagina: Secondary | ICD-10-CM

## 2012-04-30 DIAGNOSIS — N939 Abnormal uterine and vaginal bleeding, unspecified: Secondary | ICD-10-CM

## 2012-04-30 DIAGNOSIS — Z1151 Encounter for screening for human papillomavirus (HPV): Secondary | ICD-10-CM

## 2012-04-30 DIAGNOSIS — Z124 Encounter for screening for malignant neoplasm of cervix: Secondary | ICD-10-CM

## 2012-04-30 DIAGNOSIS — N950 Postmenopausal bleeding: Secondary | ICD-10-CM

## 2012-04-30 NOTE — Telephone Encounter (Signed)
Faxed pt OV notes as well as last PAP and [place] to [facility].

## 2012-04-30 NOTE — Patient Instructions (Addendum)
Call for appt with [facility]

## 2012-04-30 NOTE — Progress Notes (Signed)
Subjective:    Patient ID: Patricia Conley, female    DOB: [birth year], 62 y.o.   MRN: [id number]  HPI  Patricia Conley is here with acute visit. She reports 4 days ago began with yellow and blood tinged vaginal discharge.  She placed a tampon a 1-2 days later and she saw blood.  Still has yellowish discharge now  No itching or odor.   Former pt. Of Dr. Rosalio Macadamia.  LMP age 40.  She recalls being on estrogen (a few months only)  for a Schlink time to control hot flushes  Pelvic and TVUS 11/2010 recvealed thin unremarkable endometrium with unremarkable myometrium Last pap 11/2010 negative.  NO FH of GYN malignancy  No Known Allergies Past Medical History  Diagnosis Date  . Menopause   . Osteopenia   . Gall bladder disease     per pt report  . GERD (gastroesophageal reflux disease)   . Hemorrhoids   . Arthritis    Past Surgical History  Procedure Date  . Nasal sinus surgery 1995, 2002  . Cystectomy 1999    Right elbow  . Breast surgery 2002    cyst on Left breast  . Total hip arthroplasty 09/12/2011    Procedure: TOTAL HIP ARTHROPLASTY ANTERIOR APPROACH;  Surgeon: [name], MD;  Location: [facility];  Service: Orthopedics;  Laterality: Left;  . Joint replacement    History   Social History  . Marital Status: Divorced    Spouse Name: N/A    Number of Children: N/A  . Years of Education: N/A   Occupational History  . Not on file.   Social History Main Topics  . Smoking status: Never Smoker   . Smokeless tobacco: Never Used  . Alcohol Use: 1.0 oz/week    2 drink(s) per week  . Drug Use: No  . Sexually Active: Not Currently   Other Topics Concern  . Not on file   Social History Narrative  . No narrative on file   Family History  Problem Relation Age of Onset  . Stroke Mother   . Nephrolithiasis Mother   . Diabetes Mother   . Aneurysm Mother     x2  . Cancer Mother     lymphoma, breast cancer  . Depression Father   . Parkinsonism Father   . Drug abuse Father   .  Mental illness Sister     paranoia   Patient Active Problem List  Diagnosis  . Benign breast cyst in female  . Menopause  . Osteopenia  . Gall bladder disease  . Renal cyst  . Adrenal nodule  . Liver hemangioma  . Hemorrhoids, external without complications  . S/P left THA, AA   Current Outpatient Prescriptions on File Prior to Visit  Medication Sig Dispense Refill  . Multiple Vitamin (MULITIVITAMIN WITH MINERALS) TABS Take 1 tablet by mouth daily with breakfast.      . omeprazole-sodium bicarbonate (ZEGERID) 40-1100 MG per capsule Take 1 capsule by mouth daily before breakfast.      . b complex vitamins capsule Take 1 capsule by mouth daily.        . celecoxib (CELEBREX) 100 MG capsule Take 1 capsule (100 mg total) by mouth 2 (two) times daily.      . diphenhydrAMINE (BENADRYL) 25 mg capsule Take 1 capsule (25 mg total) by mouth every 6 (six) hours as needed for itching, allergies or sleep.      . ferrous sulfate 325 (65 FE) MG tablet Take 1  tablet (325 mg total) by mouth 3 (three) times daily after meals.      . Glucos-MSM-C-Mn-Ginger-Willow (BL MSM GLUCOSAMINE COMPLEX PO) Take by mouth daily.          Review of Systems    see HPI Objective:   Physical Exam Physical Exam  Nursing note and vitals reviewed.  Constitutional: She is oriented to person, place, and time. She appears well-developed and well-nourished.  HENT:  Head: Normocephalic and atraumatic.  Cardiovascular: Normal rate and regular rhythm. Exam reveals no gallop and no friction rub.  No murmur heard.  Pulmonary/Chest: Breath sounds normal. She has no wheezes. She has no rales. Pelvic:  Normal external genitalia.   Vagina no lesions.  Cervix small amt. Of blood in cervical os.  NO adnexal masses or tenderness  Neurological: She is alert and oriented to person, place, and time.  Skin: Skin is warm and dry.  Psychiatric: She has a normal mood and affect. Her behavior is normal.             Assessment  & Plan:  Post menopausal bleeding:  I gave pt copy of TVUS and 2012 pap and counseled to make appt with [facility] since she is an established pt there for further eval .  She voices understanding.  If any  problems setting up appt she is to let me know.      Pap obtained today with GC/chlamydia,  Wet prep and KOH  Further management based on results

## 2012-05-02 LAB — GC/CHLAMYDIA PROBE AMP, GENITAL: GC Probe Amp, Genital: NEGATIVE

## 2012-05-03 ENCOUNTER — Telehealth: Payer: Self-pay | Admitting: *Deleted

## 2012-05-03 LAB — WET PREP, GENITAL

## 2012-05-03 NOTE — Telephone Encounter (Signed)
Called to let pt know that her pap and testing were -

## 2012-05-03 NOTE — Telephone Encounter (Signed)
Message copied by Mathews Robinsons on Fri May 03, 2012 11:14 AM ------      Message from: Raechel Chute D      Created: Thu May 02, 2012 12:16 PM       Karen Kitchens              Call carol and let her know that her GC and chlamydia test were negative

## 2012-08-05 ENCOUNTER — Encounter: Payer: Self-pay | Admitting: *Deleted

## 2012-08-07 ENCOUNTER — Telehealth: Payer: Self-pay | Admitting: Internal Medicine

## 2012-08-07 NOTE — Telephone Encounter (Signed)
Pt would like a request to have a have bone density test.. She states it on 04.28.14 at [facility].. Pt lvm at 1241 pm 04.16.14... If there any questions pls cll pt at [phone]

## 2012-08-12 ENCOUNTER — Telehealth: Payer: Self-pay | Admitting: *Deleted

## 2012-08-12 ENCOUNTER — Encounter: Payer: Self-pay | Admitting: *Deleted

## 2012-08-12 NOTE — Telephone Encounter (Signed)
Per pt Insurance provider no pre certification is needed for bone density

## 2012-08-13 NOTE — Telephone Encounter (Signed)
See Ardenia's note pt is scheduled on 4/28 I will fax the order over if you can write it out for me thanks

## 2012-08-14 NOTE — Telephone Encounter (Signed)
Order on your desk

## 2012-08-14 NOTE — Telephone Encounter (Signed)
LVM Stating that order for bone density has been faxed to [facility]

## 2012-09-19 ENCOUNTER — Encounter: Payer: Self-pay | Admitting: Internal Medicine

## 2012-09-19 ENCOUNTER — Telehealth: Payer: Self-pay | Admitting: Internal Medicine

## 2012-09-19 NOTE — Telephone Encounter (Signed)
Patricia Conley    Call pt and let her know that her bone density shows osteopenia , no osteoporosis.  Advise to be sure to take her calcium 1200-1500 mg with Vitman D3 [phone] units daily

## 2012-09-20 ENCOUNTER — Encounter: Payer: Self-pay | Admitting: *Deleted

## 2012-09-20 NOTE — Telephone Encounter (Signed)
LVM message regarding bone density results

## 2012-10-27 ENCOUNTER — Encounter: Payer: Self-pay | Admitting: Internal Medicine

## 2012-10-27 DIAGNOSIS — K219 Gastro-esophageal reflux disease without esophagitis: Secondary | ICD-10-CM | POA: Insufficient documentation

## 2012-11-11 ENCOUNTER — Telehealth: Payer: Self-pay | Admitting: *Deleted

## 2012-11-11 ENCOUNTER — Encounter: Payer: Self-pay | Admitting: Internal Medicine

## 2012-11-11 ENCOUNTER — Ambulatory Visit (INDEPENDENT_AMBULATORY_CARE_PROVIDER_SITE_OTHER): Payer: BC Managed Care – PPO | Admitting: Internal Medicine

## 2012-11-11 VITALS — BP 109/71 | HR 82 | Temp 97.0°F | Resp 18 | Wt 135.0 lb

## 2012-11-11 DIAGNOSIS — M109 Gout, unspecified: Secondary | ICD-10-CM

## 2012-11-11 DIAGNOSIS — N898 Other specified noninflammatory disorders of vagina: Secondary | ICD-10-CM

## 2012-11-11 DIAGNOSIS — Z139 Encounter for screening, unspecified: Secondary | ICD-10-CM

## 2012-11-11 DIAGNOSIS — Z862 Personal history of diseases of the blood and blood-forming organs and certain disorders involving the immune mechanism: Secondary | ICD-10-CM

## 2012-11-11 DIAGNOSIS — M949 Disorder of cartilage, unspecified: Secondary | ICD-10-CM

## 2012-11-11 DIAGNOSIS — M8580 Other specified disorders of bone density and structure, unspecified site: Secondary | ICD-10-CM

## 2012-11-11 DIAGNOSIS — E279 Disorder of adrenal gland, unspecified: Secondary | ICD-10-CM

## 2012-11-11 DIAGNOSIS — N281 Cyst of kidney, acquired: Secondary | ICD-10-CM

## 2012-11-11 DIAGNOSIS — D1803 Hemangioma of intra-abdominal structures: Secondary | ICD-10-CM

## 2012-11-11 DIAGNOSIS — N939 Abnormal uterine and vaginal bleeding, unspecified: Secondary | ICD-10-CM

## 2012-11-11 DIAGNOSIS — M1990 Unspecified osteoarthritis, unspecified site: Secondary | ICD-10-CM

## 2012-11-11 DIAGNOSIS — I498 Other specified cardiac arrhythmias: Secondary | ICD-10-CM

## 2012-11-11 DIAGNOSIS — R001 Bradycardia, unspecified: Secondary | ICD-10-CM

## 2012-11-11 DIAGNOSIS — Z8739 Personal history of other diseases of the musculoskeletal system and connective tissue: Secondary | ICD-10-CM | POA: Insufficient documentation

## 2012-11-11 DIAGNOSIS — Q619 Cystic kidney disease, unspecified: Secondary | ICD-10-CM

## 2012-11-11 DIAGNOSIS — Z0000 Encounter for general adult medical examination without abnormal findings: Secondary | ICD-10-CM

## 2012-11-11 LAB — CBC WITH DIFFERENTIAL/PLATELET
Basophils Absolute: 0 10*3/uL (ref 0.0–0.1)
Basophils Relative: 0 % (ref 0–1)
Hemoglobin: 12.8 g/dL (ref 12.0–15.0)
Lymphocytes Relative: 11 % — ABNORMAL LOW (ref 12–46)
MCHC: 34.2 g/dL (ref 30.0–36.0)
Monocytes Relative: 6 % (ref 3–12)
Neutro Abs: 6.3 10*3/uL (ref 1.7–7.7)
Neutrophils Relative %: 82 % — ABNORMAL HIGH (ref 43–77)
RDW: 13.3 % (ref 11.5–15.5)
WBC: 7.7 10*3/uL (ref 4.0–10.5)

## 2012-11-11 LAB — COMPREHENSIVE METABOLIC PANEL
AST: 29 U/L (ref 0–37)
Albumin: 4.6 g/dL (ref 3.5–5.2)
Alkaline Phosphatase: 62 U/L (ref 39–117)
Chloride: 106 mEq/L (ref 96–112)
Potassium: 4.5 mEq/L (ref 3.5–5.3)
Sodium: 143 mEq/L (ref 135–145)
Total Protein: 6.5 g/dL (ref 6.0–8.3)

## 2012-11-11 LAB — POCT URINALYSIS DIPSTICK
Leukocytes, UA: NEGATIVE
Nitrite, UA: NEGATIVE
Protein, UA: NEGATIVE
Urobilinogen, UA: NEGATIVE
pH, UA: 6

## 2012-11-11 LAB — LIPID PANEL: LDL Cholesterol: 66 mg/dL (ref 0–99)

## 2012-11-11 LAB — TSH: TSH: 0.623 u[IU]/mL (ref 0.350–4.500)

## 2012-11-11 LAB — CORTISOL: Cortisol, Plasma: 10.9 ug/dL

## 2012-11-11 NOTE — Progress Notes (Signed)
Subjective:    Patient ID: Patricia Conley, female    DOB: 1950/11/11, 62 y.o.   MRN: [id number]  HPI Patricia Conley is here for CPE  She is having an enlarging joint of her L great toe where her gout was initially diagnosed.  No recent gout flare - last one 3 years ago   Working 60 hours a week at a [employer]  Family stress of assisting daughter with POTS  She did see a GYN at [facility] for her post menopausal bleeding.  No further episodes of bleeding  See MRI of 2011  9.2 mm nodule in adrenal gland and multiple liver hemangiomas  She has not had any further imaging since then  Large renal cyst is followed by Dr. Patsi Sears  She has an upcoming dermatologist appt  No Known Allergies Past Medical History  Diagnosis Date  . Menopause   . Osteopenia   . Gall bladder disease     per pt report  . GERD (gastroesophageal reflux disease)   . Hemorrhoids   . Arthritis    Past Surgical History  Procedure Laterality Date  . Nasal sinus surgery  1995, 2002  . Cystectomy  1999    Right elbow  . Breast surgery  2002    cyst on Left breast  . Total hip arthroplasty  09/12/2011    Procedure: TOTAL HIP ARTHROPLASTY ANTERIOR APPROACH;  Surgeon: [name], MD;  Location: [facility];  Service: Orthopedics;  Laterality: Left;  . Joint replacement     History   Social History  . Marital Status: Divorced    Spouse Name: N/A    Number of Children: N/A  . Years of Education: N/A   Occupational History  . Not on file.   Social History Main Topics  . Smoking status: Never Smoker   . Smokeless tobacco: Never Used  . Alcohol Use: 1.0 oz/week    2 drink(s) per week  . Drug Use: No  . Sexually Active: Not Currently   Other Topics Concern  . Not on file   Social History Narrative  . No narrative on file   Family History  Problem Relation Age of Onset  . Stroke Mother   . Nephrolithiasis Mother   . Diabetes Mother   . Aneurysm Mother     x2  . Cancer Mother     lymphoma,  breast cancer  . Depression Father   . Parkinsonism Father   . Drug abuse Father   . Mental illness Sister     paranoia   Patient Active Problem List   Diagnosis Date Noted  . Sinus bradycardia by electrocardiogram 11/11/2012  . GERD (gastroesophageal reflux disease) 10/27/2012  . Vaginal bleeding 04/30/2012  . S/P left THA, AA 09/12/2011  . Hemorrhoids, external without complications 08/18/2011  . Renal cyst 12/15/2010  . Adrenal nodule 12/15/2010  . Liver hemangioma 12/15/2010  . Benign breast cyst in female   . Menopause   . Osteopenia   . Gall bladder disease    Current Outpatient Prescriptions on File Prior to Visit  Medication Sig Dispense Refill  . b complex vitamins capsule Take 1 capsule by mouth daily.        Marland Kitchen omeprazole-sodium bicarbonate (ZEGERID) 40-1100 MG per capsule Take 1 capsule by mouth daily before breakfast.       No current facility-administered medications on file prior to visit.       Review of Systems See HPi    Objective:   Physical  Exam  Physical Exam  Nursing note and vitals reviewed.  Constitutional: She is oriented to person, place, and time. She appears well-developed and well-nourished.  HENT:  Head: Normocephalic and atraumatic.  Right Ear: Tympanic membrane and ear canal normal. No drainage. Tympanic membrane is not injected and not erythematous.  Left Ear: Tympanic membrane and ear canal normal. No drainage. Tympanic membrane is not injected and not erythematous.  Nose: Nose normal. Right sinus exhibits no maxillary sinus tenderness and no frontal sinus tenderness. Left sinus exhibits no maxillary sinus tenderness and no frontal sinus tenderness.  Mouth/Throat: Oropharynx is clear and moist. No oral lesions. No oropharyngeal exudate.  Eyes: Conjunctivae and EOM are normal. Pupils are equal, round, and reactive to light.  Neck: Normal range of motion. Neck supple. No JVD present. Carotid bruit is not present. No mass and no  thyromegaly present.  Cardiovascular: Normal rate, regular rhythm, S1 normal, S2 normal and intact distal pulses. Exam reveals no gallop and no friction rub.  No murmur heard.  Pulses:  Carotid pulses are 2+ on the right side, and 2+ on the left side.  Dorsalis pedis pulses are 2+ on the right side, and 2+ on the left side.  No carotid bruit. No LE edema  Pulmonary/Chest: Breath sounds normal. She has no wheezes. She has no rales. She exhibits no tenderness.  Abdominal: Soft. Bowel sounds are normal. She exhibits no distension and no mass. There is no hepatosplenomegaly. There is no tenderness. There is no CVA tenderness.  Musculoskeletal: Normal range of motion.  No active synovitis to joints.  Lymphadenopathy:  She has no cervical adenopathy.  She has no axillary adenopathy.  Right: No inguinal and no supraclavicular adenopathy present.  Left: No inguinal and no supraclavicular adenopathy present.  Neurological: She is alert and oriented to person, place, and time. She has normal strength and normal reflexes. She displays no tremor. No cranial nerve deficit or sensory deficit. Coordination and gait normal.  Skin: Skin is warm and dry. No rash noted. No cyanosis. Nails show no clubbing. She has dark lesion with regular borders on Right lateral leg near knee Psychiatric: She has a normal mood and affect. Her speech is normal and behavior is normal. Cognition and memory are normal.         Assessment & Plan:  Health Maintenance  Declines Zostavax. See scanned sheet  UTD with mm, pap  Check labs today  Post menopausal bleeding  She has been evaluated by GYN  History of gout last exacerbation 3 years ago.   Will refer to Dr. Raynald Kemp  Abnormal Mri/adrenal lesion/liver hemagiomas  Will get CT with contrast   Osteopenia  calicum and vitamin D  REnal cyst  Followed by Dr. Patsi Sears  U/A today small blood otherwise negative  DJD  Breast cyst

## 2012-11-11 NOTE — Telephone Encounter (Signed)
Patient notified

## 2012-11-11 NOTE — Telephone Encounter (Signed)
Called patient with Podiatrist referral information 11/14/12 at 3:00 pm

## 2012-11-12 ENCOUNTER — Encounter: Payer: Self-pay | Admitting: *Deleted

## 2012-11-12 LAB — VITAMIN D 25 HYDROXY (VIT D DEFICIENCY, FRACTURES): Vit D, 25-Hydroxy: 41 ng/mL (ref 30–89)

## 2012-11-14 ENCOUNTER — Encounter: Payer: Self-pay | Admitting: Podiatry

## 2012-11-14 ENCOUNTER — Ambulatory Visit (INDEPENDENT_AMBULATORY_CARE_PROVIDER_SITE_OTHER): Payer: BC Managed Care – PPO | Admitting: Podiatry

## 2012-11-14 ENCOUNTER — Ambulatory Visit (HOSPITAL_BASED_OUTPATIENT_CLINIC_OR_DEPARTMENT_OTHER): Admission: RE | Admit: 2012-11-14 | Payer: BC Managed Care – PPO | Source: Ambulatory Visit

## 2012-11-14 VITALS — BP 98/72 | HR 75 | Ht 67.0 in | Wt 134.0 lb

## 2012-11-14 DIAGNOSIS — M25572 Pain in left ankle and joints of left foot: Secondary | ICD-10-CM

## 2012-11-14 DIAGNOSIS — M2010 Hallux valgus (acquired), unspecified foot: Secondary | ICD-10-CM

## 2012-11-14 DIAGNOSIS — M21619 Bunion of unspecified foot: Secondary | ICD-10-CM

## 2012-11-14 DIAGNOSIS — M25579 Pain in unspecified ankle and joints of unspecified foot: Secondary | ICD-10-CM

## 2012-11-14 NOTE — Progress Notes (Signed)
Subjective: 62 y.o. year old female patient presents complaining of painful joint on left foot for the past 3-4 days. She has had the same problem 4-5 years ago.  She also has problem with eczema on left lateral heel area, and white streaks over toe nail big toe nails. She is using Vic vapor rub on nail plate.  Patient was referred by Dr. Constance Goltz.  Review of Systems - General ROS: negative for - chills, fatigue, fever, hot flashes, night sweats, sleep disturbance, weight gain or weight loss Ophthalmic ROS: Wears prescription glasses. ENT ROS: negative Allergy and Immunology ROS: negative Endocrine ROS: negative Breast ROS: negative for breast lumps Respiratory ROS: Not breathing well at night but overall is good. Cardiovascular ROS: no chest pain or dyspnea on exertion Gastrointestinal ROS: no abdominal pain, change in bowel habits, or black or bloody stools Musculoskeletal ROS: Hiatal hernia reacts at time and gets Acid reflux. Has GURD. Dermatological ROS: Dry peeling skin on left foot.   Objective: Dermatologic: Dry scaly skin left foot. No other skin lesions noted. Vascular: Pedal pulses are all palpable. No visible edema or erythema noted on the affect left great toe joint. Orthopedic: Excess motion at the base of the first MCJ bilateral L>R. Positive of enlarged medial metatarsal head L>R. Neurologic: All epicritic and tactile sensations grossly intact.  Assessment: 1. Hypermobile first ray L>R at the first Metatarsocuneiform joint. 2. Enlarged bunion left foot. 3. 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   Treatment: Reviewed clinical findings and available treatment options; Metatarsal binder to help stabilize the first ray, Orthotic shoe inserts, change shoe gear, and possible surgical option if the conservative approach fails to satisfy patient's need.  Today Metatarsal  binder dispensed for both feet. Patient will return to prepare for Orthotics and X-rays.

## 2012-11-15 DIAGNOSIS — M25579 Pain in unspecified ankle and joints of unspecified foot: Secondary | ICD-10-CM | POA: Insufficient documentation

## 2012-11-15 DIAGNOSIS — M21619 Bunion of unspecified foot: Secondary | ICD-10-CM | POA: Insufficient documentation

## 2012-11-15 MED ORDER — ARCH BANDAGE MISC: 2.0000 | Freq: Once | Status: DC

## 2012-11-18 ENCOUNTER — Emergency Department (HOSPITAL_COMMUNITY)
Admission: EM | Admit: 2012-11-18 | Discharge: 2012-11-18 | Disposition: A | Discharge status: Home / Self Care | Payer: BC Managed Care – PPO | Source: Home / Self Care

## 2012-11-18 ENCOUNTER — Ambulatory Visit (INDEPENDENT_AMBULATORY_CARE_PROVIDER_SITE_OTHER): Payer: BC Managed Care – PPO | Admitting: Physician Assistant

## 2012-11-18 VITALS — BP 110/62 | HR 76 | Temp 97.8°F | Resp 18 | Ht 67.0 in | Wt 132.0 lb

## 2012-11-18 DIAGNOSIS — R5381 Other malaise: Secondary | ICD-10-CM

## 2012-11-18 DIAGNOSIS — R5383 Other fatigue: Secondary | ICD-10-CM

## 2012-11-18 DIAGNOSIS — J029 Acute pharyngitis, unspecified: Secondary | ICD-10-CM

## 2012-11-18 LAB — POCT CBC
Granulocyte percent: 69.8 %G (ref 37–80)
MCV: 91.2 fL (ref 80–97)
MID (cbc): 0.4 (ref 0–0.9)
Platelet Count, POC: 257 10*3/uL (ref 142–424)
RBC: 4.7 M/uL (ref 4.04–5.48)

## 2012-11-18 NOTE — Progress Notes (Signed)
[address] [place] [address]   Phone [phone]  Subjective:    Patient ID: Patricia Conley, female    DOB: Mar 27, 1951, 62 y.o.   MRN: [id number]  HPI Pt presents to clinic with a week long h/o intermittent sore throat and fatigue.  She went to a social gathering 8 days ago and then had her CPE 1 wk ago with essentially normal labs and then later than afternoon she started to feel bad - like she was getting sick but then never developed cold symptoms.  Then on Wed she got a sore throat but still no other cold symptoms. On Sat she slept most of the day but yesterday felt a little better.  Today she feels like she did again on Sat and just wants to make sure that everything is ok.    OTC - none Review of Systems  Constitutional: Positive for fatigue. Negative for fever and chills.  HENT: Positive for sore throat and postnasal drip. Negative for congestion and rhinorrhea.   Gastrointestinal: Negative for nausea, vomiting and diarrhea.  Musculoskeletal: Positive for myalgias.  Allergic/Immunologic: Negative for environmental allergies.       Objective:   Physical Exam  Vitals reviewed. Constitutional: She is oriented to person, place, and time. She appears well-developed and well-nourished.  HENT:  Head: Normocephalic and atraumatic.  Right Ear: External ear normal.  Left Ear: External ear normal.  Eyes: Conjunctivae are normal.  Neck: Normal range of motion.  Cardiovascular: Normal rate, regular rhythm and normal heart sounds.   No murmur heard. Pulmonary/Chest: Effort normal and breath sounds normal.  Lymphadenopathy:       Head (right side): Tonsillar adenopathy present.       Head (left side): Tonsillar adenopathy present.    She has cervical adenopathy.       Right cervical: Superficial cervical adenopathy present.       Left cervical: Superficial cervical adenopathy present.  Neurological: She is alert and oriented to person, place, and time.  Skin: Skin is warm  and dry.  Psychiatric: She has a normal mood and affect. Her behavior is normal. Judgment and thought content normal.    Results for orders placed in visit on 11/18/12  POCT CBC      Result Value Range   WBC 6.6  4.6 - 10.2 K/uL   Lymph, poc 1.6  0.6 - 3.4   POC LYMPH PERCENT 24.5  10 - 50 %L   MID (cbc) 0.4  0 - 0.9   POC MID % 5.7  0 - 12 %M   POC Granulocyte 4.6  2 - 6.9   Granulocyte percent 69.8  37 - 80 %G   RBC 4.70  4.04 - 5.48 M/uL   Hemoglobin 13.5  12.2 - 16.2 g/dL   HCT, POC 21.3  08.6 - 47.9 %   MCV 91.2  80 - 97 fL   MCH, POC 28.7  27 - 31.2 pg   MCHC 31.5 (*) 31.8 - 35.4 g/dL   RDW, POC 57.8     Platelet Count, POC 257  142 - 424 K/uL   MPV 9.2  0 - 99.8 fL  POCT RAPID STREP A (OFFICE)      Result Value Range   Rapid Strep A Screen Negative  Negative         Assessment & Plan:  Other malaise and fatigue - Plan: POCT CBC, POCT rapid strep A   I expect pt is experiencing a viral illness  with fatigue and intermittent sore throat.  It is possible she has increase PND that she is not aware of from her allergies.  Benny Lennert PA-C 11/18/2012 3:14 PM

## 2012-11-20 ENCOUNTER — Ambulatory Visit (INDEPENDENT_AMBULATORY_CARE_PROVIDER_SITE_OTHER): Payer: BC Managed Care – PPO | Admitting: Internal Medicine

## 2012-11-20 ENCOUNTER — Ambulatory Visit (HOSPITAL_BASED_OUTPATIENT_CLINIC_OR_DEPARTMENT_OTHER)
Admission: RE | Admit: 2012-11-20 | Discharge: 2012-11-20 | Disposition: A | Discharge status: Home / Self Care | Payer: BC Managed Care – PPO | Source: Ambulatory Visit | Attending: Internal Medicine | Admitting: Internal Medicine

## 2012-11-20 ENCOUNTER — Encounter (HOSPITAL_BASED_OUTPATIENT_CLINIC_OR_DEPARTMENT_OTHER): Payer: Self-pay

## 2012-11-20 ENCOUNTER — Encounter: Payer: Self-pay | Admitting: Internal Medicine

## 2012-11-20 VITALS — BP 112/73 | HR 69 | Temp 97.2°F | Resp 18

## 2012-11-20 DIAGNOSIS — D1803 Hemangioma of intra-abdominal structures: Secondary | ICD-10-CM | POA: Insufficient documentation

## 2012-11-20 DIAGNOSIS — J029 Acute pharyngitis, unspecified: Secondary | ICD-10-CM

## 2012-11-20 DIAGNOSIS — J329 Chronic sinusitis, unspecified: Secondary | ICD-10-CM

## 2012-11-20 DIAGNOSIS — N854 Malposition of uterus: Secondary | ICD-10-CM | POA: Insufficient documentation

## 2012-11-20 DIAGNOSIS — R040 Epistaxis: Secondary | ICD-10-CM

## 2012-11-20 DIAGNOSIS — Z96649 Presence of unspecified artificial hip joint: Secondary | ICD-10-CM | POA: Insufficient documentation

## 2012-11-20 DIAGNOSIS — N281 Cyst of kidney, acquired: Secondary | ICD-10-CM | POA: Insufficient documentation

## 2012-11-20 DIAGNOSIS — D3500 Benign neoplasm of unspecified adrenal gland: Secondary | ICD-10-CM | POA: Insufficient documentation

## 2012-11-20 LAB — APTT: aPTT: 33 seconds (ref 24–37)

## 2012-11-20 LAB — PROTIME-INR: INR: 0.98 (ref <1.50)

## 2012-11-20 MED ORDER — AMOXICILLIN-POT CLAVULANATE 500-125 MG PO TABS: 1.0000 | ORAL_TABLET | Freq: Three times a day (TID) | ORAL | Status: DC

## 2012-11-20 MED ORDER — IOHEXOL 300 MG/ML  SOLN
100.0000 mL | Freq: Once | INTRAMUSCULAR | Status: AC | PRN
  Administered 2012-11-20: 100 mL via INTRAVENOUS

## 2012-11-20 NOTE — Progress Notes (Signed)
Subjective:    Patient ID: Patricia Conley, female    DOB: 1950/09/05, 62 y.o.   MRN: [id number]  HPI  Patricia Conley is here for acute visit.   She has had 7-10 days of sore throat nasal congestion.  She was seen at [facility] where her WBC was normal and rapid strep negative.  She actually feels somewhat better today. Felt to have viral syndrome by urgent care  She also reports she has had 5 episodes of nasal bleeding over the last month.  No history of allergic rhinitis.  She does not take any allergy meds.  She did have sinus surgery by Dr. Lazarus Salines in the past.  I do not have those notes.    Last weekend she had an episode of her gums bleeding.  No vagina bleeding no report of easy bruising  No Known Allergies Past Medical History  Diagnosis Date  . Menopause   . Osteopenia   . Gall bladder disease     per pt report  . GERD (gastroesophageal reflux disease)   . Hemorrhoids   . Arthritis    Past Surgical History  Procedure Laterality Date  . Nasal sinus surgery  1995, 2002  . Cystectomy  1999    Right elbow  . Breast surgery  2002    cyst on Left breast  . Total hip arthroplasty  09/12/2011    Procedure: TOTAL HIP ARTHROPLASTY ANTERIOR APPROACH;  Surgeon: [name], MD;  Location: [facility];  Service: Orthopedics;  Laterality: Left;  . Joint replacement     History   Social History  . Marital Status: Divorced    Spouse Name: N/A    Number of Children: N/A  . Years of Education: N/A   Occupational History  . Not on file.   Social History Main Topics  . Smoking status: Never Smoker   . Smokeless tobacco: Never Used  . Alcohol Use: 1.0 oz/week    2 drink(s) per week  . Drug Use: No  . Sexually Active: Not Currently   Other Topics Concern  . Not on file   Social History Narrative  . No narrative on file   Family History  Problem Relation Age of Onset  . Stroke Mother   . Nephrolithiasis Mother   . Diabetes Mother   . Aneurysm Mother     x2  . Cancer Mother    lymphoma, breast cancer  . Depression Father   . Parkinsonism Father   . Drug abuse Father   . Mental illness Sister     paranoia   Patient Active Problem List   Diagnosis Date Noted  . Epistaxis 11/20/2012  . Bunion of great toe 11/15/2012  . Arthralgia of ankle or foot 11/15/2012  . Sinus bradycardia by electrocardiogram 11/11/2012  . DJD (degenerative joint disease) 11/11/2012  . History of gout 11/11/2012  . GERD (gastroesophageal reflux disease) 10/27/2012  . Vaginal bleeding 04/30/2012  . S/P left THA, AA 09/12/2011  . Hemorrhoids, external without complications 08/18/2011  . Renal cyst 12/15/2010  . Adrenal nodule 12/15/2010  . Liver hemangioma 12/15/2010  . Benign breast cyst in female   . Menopause   . Osteopenia   . Gall bladder disease    Current Outpatient Prescriptions on File Prior to Visit  Medication Sig Dispense Refill  . b complex vitamins capsule Take 1 capsule by mouth daily.        Marland Kitchen UNKNOWN TO PATIENT 1 tablet. K+ and magnesium      .  Zinc Sulfate (ZINC 15 PO) Take 1 tablet by mouth daily.       Current Facility-Administered Medications on File Prior to Visit  Medication Dose Route Frequency Provider Last Rate Last Dose  . Arch Bandage MISC 2 each  2 each Does not apply Once Myeong Sheard, DPM           Review of Systems See HPI    Objective:   Physical Exam Physical Exam  Nursing note and vitals reviewed.  Constitutional: She is oriented to person, place, and time. She appears well-developed and well-nourished.  HENT:  Head: Normocephalic and atraumatic.  Nares ;  No site of active bleeding on exam.  She does have maxillary sinus tenderness Cardiovascular: Normal rate and regular rhythm. Exam reveals no gallop and no friction rub.  No murmur heard.  Pulmonary/Chest: Breath sounds normal. She has no wheezes. She has no rales.  Neurological: She is alert and oriented to person, place, and time.  Skin: Skin is warm and dry.  Psychiatric: She  has a normal mood and affect. Her behavior is normal.             Assessment & Plan:  Nosebleeds  Will check basic Pt abd PTT  and empirically give Augmentin 50o tid for 7 days.  She is counseled to call her ENT Dr. Lazarus Salines for complete exam  Check PT and PTT  Strep test neg today  May be recovering from viral pharyngitis.   Normal WBC on 7/28  Probable  Sinusitis

## 2012-11-21 ENCOUNTER — Telehealth: Payer: Self-pay | Admitting: *Deleted

## 2012-11-21 NOTE — Telephone Encounter (Signed)
Notified pt of blood test. Pt concerned over CT results will notify Dr Constance Goltz

## 2012-11-21 NOTE — Telephone Encounter (Signed)
Message copied by Mathews Robinsons on Thu Nov 21, 2012  3:36 PM ------      Message from: Raechel Chute D      Created: Thu Nov 21, 2012 10:49 AM       Karen Kitchens            Call pt and let her know that her screening clotting blood tests are normal. ------

## 2012-11-25 ENCOUNTER — Telehealth: Payer: Self-pay | Admitting: *Deleted

## 2012-11-25 NOTE — Telephone Encounter (Signed)
Requesting results of recent CT scan.

## 2012-11-25 NOTE — Telephone Encounter (Signed)
Dr Constance Goltz, Have you had a chance to sign off on the CT Scan results? If so, please let me know and I will call pt to adv.

## 2012-11-26 ENCOUNTER — Telehealth: Payer: Self-pay | Admitting: Internal Medicine

## 2012-11-26 NOTE — Telephone Encounter (Signed)
Spoke with pt and  Informed of CT result.   Adrenal adenoma stable in size at 9 mm, Multiple Liver hemangiomas  Pt still notes fatigue for past 2 weeks.  I advised if no improvement soon,  She can see me in office and I will check cortisol level.  She voices understanding

## 2012-11-26 NOTE — Telephone Encounter (Signed)
I spoke with pt  

## 2012-12-04 ENCOUNTER — Encounter: Payer: BC Managed Care – PPO | Admitting: Podiatry

## 2012-12-11 ENCOUNTER — Ambulatory Visit: Payer: BC Managed Care – PPO | Admitting: Podiatry

## 2012-12-12 ENCOUNTER — Encounter: Payer: Self-pay | Admitting: Internal Medicine

## 2012-12-12 ENCOUNTER — Ambulatory Visit (INDEPENDENT_AMBULATORY_CARE_PROVIDER_SITE_OTHER): Payer: BC Managed Care – PPO | Admitting: Internal Medicine

## 2012-12-12 VITALS — BP 117/69 | HR 78 | Temp 97.5°F | Resp 18 | Wt 131.0 lb

## 2012-12-12 DIAGNOSIS — N3941 Urge incontinence: Secondary | ICD-10-CM

## 2012-12-12 DIAGNOSIS — K148 Other diseases of tongue: Secondary | ICD-10-CM

## 2012-12-12 LAB — POCT URINALYSIS DIPSTICK
Bilirubin, UA: NEGATIVE
Leukocytes, UA: NEGATIVE
Nitrite, UA: NEGATIVE
Protein, UA: NEGATIVE
pH, UA: 5

## 2012-12-12 NOTE — Progress Notes (Signed)
Subjective:    Patient ID: Patricia Conley, female    DOB: 1950-07-18, 62 y.o.   MRN: [id number]  HPI [name] is here for acute visit.  She was concerned about her tongue changing color first noticed last pm  States it looked like indigo blue last pm and there was some blue coloration on the side of tongue today but now symptoms are all gone  She also notes urinary urge symptoms.  No stress incontinence symptoms.  No dysuria  No Known Allergies Past Medical History  Diagnosis Date  . Menopause   . Osteopenia   . Gall bladder disease     per pt report  . GERD (gastroesophageal reflux disease)   . Hemorrhoids   . Arthritis    Past Surgical History  Procedure Laterality Date  . Nasal sinus surgery  1995, 2002  . Cystectomy  1999    Right elbow  . Breast surgery  2002    cyst on Left breast  . Total hip arthroplasty  09/12/2011    Procedure: TOTAL HIP ARTHROPLASTY ANTERIOR APPROACH;  Surgeon: [name], MD;  Location: [facility];  Service: Orthopedics;  Laterality: Left;  . Joint replacement     History   Social History  . Marital Status: Divorced    Spouse Name: N/A    Number of Children: N/A  . Years of Education: N/A   Occupational History  . Not on file.   Social History Main Topics  . Smoking status: Never Smoker   . Smokeless tobacco: Never Used  . Alcohol Use: 1.0 oz/week    2 drink(s) per week  . Drug Use: No  . Sexual Activity: Not Currently   Other Topics Concern  . Not on file   Social History Narrative  . No narrative on file   Family History  Problem Relation Age of Onset  . Stroke Mother   . Nephrolithiasis Mother   . Diabetes Mother   . Aneurysm Mother     x2  . Cancer Mother     lymphoma, breast cancer  . Depression Father   . Parkinsonism Father   . Drug abuse Father   . Mental illness Sister     paranoia   Patient Active Problem List   Diagnosis Date Noted  . Epistaxis 11/20/2012  . Bunion of great toe 11/15/2012  . Arthralgia of  ankle or foot 11/15/2012  . Sinus bradycardia by electrocardiogram 11/11/2012  . DJD (degenerative joint disease) 11/11/2012  . History of gout 11/11/2012  . GERD (gastroesophageal reflux disease) 10/27/2012  . Vaginal bleeding 04/30/2012  . S/P left THA, AA 09/12/2011  . Hemorrhoids, external without complications 08/18/2011  . Renal cyst 12/15/2010  . Adrenal nodule 12/15/2010  . Liver hemangioma 12/15/2010  . Benign breast cyst in female   . Menopause   . Osteopenia   . Gall bladder disease    Current Outpatient Prescriptions on File Prior to Visit  Medication Sig Dispense Refill  . b complex vitamins capsule Take 1 capsule by mouth daily.        Marland Kitchen UNKNOWN TO PATIENT 1 tablet. K+ and magnesium      . Zinc Sulfate (ZINC 15 PO) Take 1 tablet by mouth daily.       No current facility-administered medications on file prior to visit.      Review of Systems See hpi     Objective:   Physical Exam   Physical Exam  Nursing note and vitals reviewed.  Constitutional: She is oriented to person, place, and time. She appears well-developed and well-nourished.  HENT:  Oral exam  NOrmal color to her tongue  Normal vascular patter,  No evidence of geopraphic tongue Head: Normocephalic and atraumatic.  Neck no lymphadenoapthy   Cardiovascular: Normal rate and regular rhythm. Exam reveals no gallop and no friction rub.  No murmur heard.  Pulmonary/Chest: Breath sounds normal. She has no wheezes. She has no rales.  Neurological: She is alert and oriented to person, place, and time.  Skin: Skin is warm and dry.  Psychiatric: She has a normal mood and affect. Her behavior is normal.        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  ADvised to take picture if it reutrns  Urge incontinence  U/A normal today.  OK to try OTC oxytrol    See me if recurs

## 2012-12-12 NOTE — Addendum Note (Signed)
Addended by: Mathews Robinsons on: 12/12/2012 03:17 PM   Modules accepted: Orders

## 2012-12-12 NOTE — Patient Instructions (Addendum)
Google geographic tongue

## 2012-12-18 ENCOUNTER — Ambulatory Visit (INDEPENDENT_AMBULATORY_CARE_PROVIDER_SITE_OTHER): Payer: BC Managed Care – PPO | Admitting: Podiatry

## 2012-12-18 DIAGNOSIS — M216X9 Other acquired deformities of unspecified foot: Secondary | ICD-10-CM

## 2012-12-18 DIAGNOSIS — M2012 Hallux valgus (acquired), left foot: Secondary | ICD-10-CM

## 2012-12-18 DIAGNOSIS — M25572 Pain in left ankle and joints of left foot: Secondary | ICD-10-CM

## 2012-12-18 DIAGNOSIS — M25579 Pain in unspecified ankle and joints of unspecified foot: Secondary | ICD-10-CM

## 2012-12-18 DIAGNOSIS — M21619 Bunion of unspecified foot: Secondary | ICD-10-CM

## 2012-12-18 NOTE — Progress Notes (Signed)
62 year old female presents to prepare for custom orthotics.  X-rays done on both feet. Findings reveal long 2nd metatarsal length, increased first inter metatarsal angle, enlarged medial eminence of the first metatarsal head, fibular sesamoid position at 4 on left 2 on right, medially displaced 2nd proximal phalangeal base against the 2nd L>R, metatarsal head on AP view. lateral view indicate dorsally elevated first metatarsal head L>R in high arched cavus foot bilateral. Positive of dorsally enlarged metatarsal head on left.   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 Elevated first ray L>R. Pain at plantar medial aspect of the first metatarsal neck area. R/O Neuralgia.  Plan: Both feet were casted for Orthotics.

## 2013-02-07 ENCOUNTER — Ambulatory Visit: Payer: BC Managed Care – PPO | Admitting: Podiatry

## 2013-02-12 ENCOUNTER — Ambulatory Visit: Payer: BC Managed Care – PPO | Admitting: Podiatry

## 2013-02-22 ENCOUNTER — Ambulatory Visit (INDEPENDENT_AMBULATORY_CARE_PROVIDER_SITE_OTHER): Payer: BC Managed Care – PPO | Admitting: Emergency Medicine

## 2013-02-22 VITALS — BP 126/72 | HR 88 | Temp 97.8°F | Resp 18 | Ht 67.0 in | Wt 135.0 lb

## 2013-02-22 DIAGNOSIS — R319 Hematuria, unspecified: Secondary | ICD-10-CM

## 2013-02-22 LAB — POCT URINALYSIS DIPSTICK
Bilirubin, UA: NEGATIVE
Glucose, UA: NEGATIVE
Ketones, UA: NEGATIVE
Leukocytes, UA: NEGATIVE
Nitrite, UA: NEGATIVE
Protein, UA: NEGATIVE
Spec Grav, UA: 1.01
Urobilinogen, UA: 0.2
pH, UA: 7

## 2013-02-22 LAB — POCT UA - MICROSCOPIC ONLY
Bacteria, U Microscopic: NEGATIVE
Casts, Ur, LPF, POC: NEGATIVE
Crystals, Ur, HPF, POC: NEGATIVE
Mucus, UA: NEGATIVE
RBC, urine, microscopic: NEGATIVE
WBC, Ur, HPF, POC: NEGATIVE
Yeast, UA: NEGATIVE

## 2013-02-22 NOTE — Progress Notes (Signed)
[facility] [address] [place] [address] [phone]  Date:  02/22/2013   Name:  Patricia Conley   DOB:  1951/04/02   MRN:  [id number]  PCP:  Levon Hedger, MD    Chief Complaint: Hematuria   History of Present Illness:  Patricia Conley is a 62 y.o. very pleasant female patient who presents with the following:  Says that she noticed blood in her urine started yesterday.  Experiencing nocturia for the past 4 months  Has urgency incontinence and dysuria, frequency and some urgency.  No fever or chills, nausea or vomiting.  Is certain she does not have vaginal bleeding. Denies other complaint or health concern today.   Patient Active Problem List   Diagnosis Date Noted  . Pain in joint, ankle and foot 12/18/2012  . Epistaxis 11/20/2012  . Bunion of great toe 11/15/2012  . Arthralgia of ankle or foot 11/15/2012  . Sinus bradycardia by electrocardiogram 11/11/2012  . DJD (degenerative joint disease) 11/11/2012  . History of gout 11/11/2012  . GERD (gastroesophageal reflux disease) 10/27/2012  . Vaginal bleeding 04/30/2012  . S/P left THA, AA 09/12/2011  . Hemorrhoids, external without complications 08/18/2011  . Renal cyst 12/15/2010  . Adrenal nodule 12/15/2010  . Liver hemangioma 12/15/2010  . Benign breast cyst in female   . Menopause   . Osteopenia   . Gall bladder disease     Past Medical History  Diagnosis Date  . Menopause   . Osteopenia   . Gall bladder disease     per pt report  . GERD (gastroesophageal reflux disease)   . Hemorrhoids   . Arthritis     Past Surgical History  Procedure Laterality Date  . Nasal sinus surgery  1995, 2002  . Cystectomy  1999    Right elbow  . Breast surgery  2002    cyst on Left breast  . Total hip arthroplasty  09/12/2011    Procedure: TOTAL HIP ARTHROPLASTY ANTERIOR APPROACH;  Surgeon: [name], MD;  Location: [facility];  Service: Orthopedics;  Laterality: Left;  . Joint replacement       History  Substance Use Topics  . Smoking status: Never Smoker   . Smokeless tobacco: Never Used  . Alcohol Use: 1.0 oz/week    2 drink(s) per week    Family History  Problem Relation Age of Onset  . Stroke Mother   . Nephrolithiasis Mother   . Diabetes Mother   . Aneurysm Mother     x2  . Cancer Mother     lymphoma, breast cancer  . Depression Father   . Parkinsonism Father   . Drug abuse Father   . Mental illness Sister     paranoia    No Known Allergies  Medication list has been reviewed and updated.  Current Outpatient Prescriptions on File Prior to Visit  Medication Sig Dispense Refill  . b complex vitamins capsule Take 1 capsule by mouth daily.        Marland Kitchen UNKNOWN TO PATIENT 1 tablet. K+ and magnesium      . Zinc Sulfate (ZINC 15 PO) Take 1 tablet by mouth daily.       No current facility-administered medications on file prior to visit.    Review of Systems:  As per HPI, otherwise negative.    Physical Examination: Filed Vitals:   02/22/13 0844  BP: 126/72  Pulse: 88  Temp: 97.8 F (36.6 C)  Resp: 18  Filed Vitals:   02/22/13 0844  Height: 5\' 7"  (1.702 m)  Weight: 135 lb (61.236 kg)   Body mass index is 21.14 kg/(m^2). Ideal Body Weight: Weight in (lb) to have BMI = 25: 159.3   GEN: WDWN, NAD, Non-toxic, Alert & Oriented x 3 HEENT: Atraumatic, Normocephalic.  Ears and Nose: No external deformity. EXTR: No clubbing/cyanosis/edema NEURO: Normal gait.  PSYCH: Normally interactive. Conversant. Not depressed or anxious appearing.  Calm demeanor.  Abdomen:    Assessment and Plan: Nocturia and frequency Urology consultation   Signed,  Phillips Odor, MD   Results for orders placed in visit on 02/22/13  POCT URINALYSIS DIPSTICK      Result Value Range   Color, UA light yellow     Clarity, UA clear     Glucose, UA neg     Bilirubin, UA neg     Ketones, UA neg     Spec Grav, UA 1.010     Blood, UA trace     pH, UA 7.0      Protein, UA neg     Urobilinogen, UA 0.2     Nitrite, UA neg     Leukocytes, UA Negative    POCT UA - MICROSCOPIC ONLY      Result Value Range   WBC, Ur, HPF, POC neg     RBC, urine, microscopic neg     Bacteria, U Microscopic neg     Mucus, UA neg     Epithelial cells, urine per micros 0-1     Crystals, Ur, HPF, POC neg     Casts, Ur, LPF, POC neg     Yeast, UA neg

## 2013-02-23 LAB — URINE CULTURE
Colony Count: NO GROWTH
Organism ID, Bacteria: NO GROWTH

## 2013-02-27 ENCOUNTER — Other Ambulatory Visit: Payer: Self-pay

## 2013-06-06 ENCOUNTER — Other Ambulatory Visit: Payer: Self-pay | Admitting: Orthopedic Surgery

## 2013-06-09 ENCOUNTER — Encounter (HOSPITAL_COMMUNITY): Payer: Self-pay | Admitting: Pharmacy Technician

## 2013-06-10 ENCOUNTER — Other Ambulatory Visit (HOSPITAL_COMMUNITY): Payer: Self-pay | Admitting: *Deleted

## 2013-06-13 ENCOUNTER — Inpatient Hospital Stay (HOSPITAL_COMMUNITY): Admission: RE | Admit: 2013-06-13 | Payer: BC Managed Care – PPO | Source: Ambulatory Visit

## 2013-06-18 ENCOUNTER — Ambulatory Visit (HOSPITAL_COMMUNITY)
Admission: RE | Admit: 2013-06-18 | Payer: BC Managed Care – PPO | Source: Ambulatory Visit | Admitting: Orthopedic Surgery

## 2013-06-18 ENCOUNTER — Encounter (HOSPITAL_COMMUNITY): Admission: RE | Payer: Self-pay | Source: Ambulatory Visit

## 2013-06-18 SURGERY — ARTHROSCOPY, KNEE
Anesthesia: Choice | Site: Knee | Laterality: Left

## 2013-08-11 ENCOUNTER — Ambulatory Visit (INDEPENDENT_AMBULATORY_CARE_PROVIDER_SITE_OTHER): Payer: BC Managed Care – PPO | Admitting: Internal Medicine

## 2013-08-11 ENCOUNTER — Other Ambulatory Visit: Payer: Self-pay | Admitting: Internal Medicine

## 2013-08-11 ENCOUNTER — Encounter: Payer: Self-pay | Admitting: Internal Medicine

## 2013-08-11 VITALS — BP 117/74 | HR 92 | Temp 98.0°F | Resp 18 | Wt 137.0 lb

## 2013-08-11 DIAGNOSIS — R197 Diarrhea, unspecified: Secondary | ICD-10-CM

## 2013-08-11 LAB — CBC WITH DIFFERENTIAL/PLATELET
Basophils Absolute: 0 10*3/uL (ref 0.0–0.1)
Basophils Relative: 0 % (ref 0–1)
EOS ABS: 0.1 10*3/uL (ref 0.0–0.7)
EOS PCT: 1 % (ref 0–5)
HCT: 38.1 % (ref 36.0–46.0)
HEMOGLOBIN: 13 g/dL (ref 12.0–15.0)
LYMPHS ABS: 1.4 10*3/uL (ref 0.7–4.0)
LYMPHS PCT: 25 % (ref 12–46)
MCH: 28.4 pg (ref 26.0–34.0)
MCHC: 34.1 g/dL (ref 30.0–36.0)
MCV: 83.4 fL (ref 78.0–100.0)
MONOS PCT: 6 % (ref 3–12)
Monocytes Absolute: 0.3 10*3/uL (ref 0.1–1.0)
Neutro Abs: 3.9 10*3/uL (ref 1.7–7.7)
Neutrophils Relative %: 68 % (ref 43–77)
PLATELETS: 264 10*3/uL (ref 150–400)
RBC: 4.57 MIL/uL (ref 3.87–5.11)
RDW: 13.1 % (ref 11.5–15.5)
WBC: 5.7 10*3/uL (ref 4.0–10.5)

## 2013-08-11 NOTE — Patient Instructions (Signed)
See me in 2-3 weeks  Get Immmodium and take as directed

## 2013-08-11 NOTE — Progress Notes (Signed)
Subjective:    Patient ID: Patricia Conley, female    DOB: October 04, 1950, 63 y.o.   MRN: [id number]  HPI  Patricia Conley is here for acute visit.  Patricia Conley reports 2 episodes of wathery diarrhean las 2-3 weeks.  Her GI MD told her Patricia Conley does have irritable bowe. And has given her a probiotic for this  Patricia Conley denies any antibiotic use in the last month.  No blood or dark stools.   Patricia Conley takes VSL3 probiotic but no daily.  No fever no abd pain    No N/V    Patricia Conley does have upcoming appt. With GI MD but not until June   No Known Allergies Past Medical History  Diagnosis Date  . Menopause   . Osteopenia   . Gall bladder disease     per pt report  . GERD (gastroesophageal reflux disease)   . Hemorrhoids   . Arthritis    Past Surgical History  Procedure Laterality Date  . Nasal sinus surgery  1995, 2002  . Cystectomy  1999    Right elbow  . Breast surgery  2002    cyst on Left breast  . Total hip arthroplasty  09/12/2011    Procedure: TOTAL HIP ARTHROPLASTY ANTERIOR APPROACH;  Surgeon: Mauri Pole, MD;  Location: [facility];  Service: Orthopedics;  Laterality: Left;  . Joint replacement     History   Social History  . Marital Status: Divorced    Spouse Name: N/A    Number of Children: N/A  . Years of Education: N/A   Occupational History  . Not on file.   Social History Main Topics  . Smoking status: Never Smoker   . Smokeless tobacco: Never Used  . Alcohol Use: 1.0 oz/week    2 drink(s) per week  . Drug Use: No  . Sexual Activity: Not Currently   Other Topics Concern  . Not on file   Social History Narrative  . No narrative on file   Family History  Problem Relation Age of Onset  . Stroke Mother   . Nephrolithiasis Mother   . Diabetes Mother   . Aneurysm Mother     x2  . Cancer Mother     lymphoma, breast cancer  . Depression Father   . Parkinsonism Father   . Drug abuse Father   . Mental illness Sister     paranoia   Patient Active Problem List   Diagnosis Date Noted  .  Pain in joint, ankle and foot 12/18/2012  . Epistaxis 11/20/2012  . Bunion of great toe 11/15/2012  . Arthralgia of ankle or foot 11/15/2012  . Sinus bradycardia by electrocardiogram 11/11/2012  . DJD (degenerative joint disease) 11/11/2012  . History of gout 11/11/2012  . GERD (gastroesophageal reflux disease) 10/27/2012  . Vaginal bleeding 04/30/2012  . S/P left THA, AA 09/12/2011  . Hemorrhoids, external without complications 79/89/2119  . Renal cyst 12/15/2010  . Adrenal nodule 12/15/2010  . Liver hemangioma 12/15/2010  . Benign breast cyst in female   . Menopause   . Osteopenia   . Gall bladder disease    Current Outpatient Prescriptions on File Prior to Visit  Medication Sig Dispense Refill  . CALCIUM-MAGNESIUM-VITAMIN D ER PO Take 1 tablet by mouth daily.      . Coenzyme Q10 (CO Q-10) 50 MG CAPS Take 50 mg by mouth daily.      . Magnesium 500 MG CAPS Take 500 mg by mouth daily.      Marland Kitchen  Potassium 99 MG TABS Take 99 mg by mouth daily.      . vitamin C (ASCORBIC ACID) 500 MG tablet Take 500 mg by mouth daily.      . Zinc Sulfate (ZINC 15 PO) Take 1 tablet by mouth daily.      Marland Kitchen b complex vitamins capsule Take 1 capsule by mouth daily.        Earney Navy Bicarbonate (ZEGERID) 20-1100 MG CAPS capsule Take 1 capsule by mouth daily before breakfast.       No current facility-administered medications on file prior to visit.       Review of Systems See HPI    Objective:   Physical Exam  Physical Exam  Nursing note and vitals reviewed.  Constitutional: Patricia Conley is oriented to person, place, and time. Patricia Conley appears well-developed and well-nourished.  HENT:  Head: Normocephalic and atraumatic.  Cardiovascular: Normal rate and regular rhythm. Exam reveals no gallop and no friction rub.  No murmur heard.  Pulmonary/Chest: Breath sounds normal. Patricia Conley has no wheezes. Patricia Conley has no rales.  Abd soft NT?ND  BS +  No HSM  Rectal brown stool guaiac neg Neurological: Patricia Conley is alert and  oriented to person, place, and time.  Skin: Skin is warm and dry.  Psychiatric: Patricia Conley has a normal mood and affect. Her behavior is normal.            Assessment & Plan:  Diarrhea will get stool studies and labs.  Take proiotic daily and OTC immodium   See me in 2-3 weeks

## 2013-08-12 ENCOUNTER — Encounter: Payer: Self-pay | Admitting: Internal Medicine

## 2013-08-12 ENCOUNTER — Telehealth: Payer: Self-pay | Admitting: Internal Medicine

## 2013-08-12 LAB — COMPREHENSIVE METABOLIC PANEL
ALT: 14 U/L (ref 0–35)
AST: 19 U/L (ref 0–37)
Albumin: 4.8 g/dL (ref 3.5–5.2)
Alkaline Phosphatase: 60 U/L (ref 39–117)
BUN: 16 mg/dL (ref 6–23)
CALCIUM: 9.4 mg/dL (ref 8.4–10.5)
CHLORIDE: 102 meq/L (ref 96–112)
CO2: 27 meq/L (ref 19–32)
CREATININE: 0.72 mg/dL (ref 0.50–1.10)
GLUCOSE: 85 mg/dL (ref 70–99)
Potassium: 4.3 mEq/L (ref 3.5–5.3)
SODIUM: 137 meq/L (ref 135–145)
TOTAL PROTEIN: 6.6 g/dL (ref 6.0–8.3)
Total Bilirubin: 0.9 mg/dL (ref 0.2–1.2)

## 2013-08-12 LAB — CLOSTRIDIUM DIFFICILE CULTURE-FECAL

## 2013-08-12 LAB — TSH: TSH: 0.255 u[IU]/mL — AB (ref 0.350–4.500)

## 2013-08-14 ENCOUNTER — Other Ambulatory Visit: Payer: Self-pay | Admitting: *Deleted

## 2013-08-14 ENCOUNTER — Other Ambulatory Visit: Payer: Self-pay | Admitting: Internal Medicine

## 2013-08-14 LAB — T4, FREE: Free T4: 1.03 ng/dL (ref 0.80–1.80)

## 2013-08-14 LAB — T3: T3, Total: 121.2 ng/dL (ref 80.0–204.0)

## 2013-08-14 NOTE — Telephone Encounter (Signed)
T3 and T4 added to recent labs

## 2013-08-18 LAB — STOOL CULTURE

## 2013-08-20 ENCOUNTER — Encounter: Payer: Self-pay | Admitting: Internal Medicine

## 2013-08-20 LAB — CLOSTRIDIUM DIFFICILE CULTURE-FECAL

## 2013-08-23 ENCOUNTER — Other Ambulatory Visit: Payer: Self-pay | Admitting: Orthopedic Surgery

## 2013-08-25 ENCOUNTER — Ambulatory Visit (INDEPENDENT_AMBULATORY_CARE_PROVIDER_SITE_OTHER): Payer: BC Managed Care – PPO | Admitting: Internal Medicine

## 2013-08-25 ENCOUNTER — Encounter: Payer: Self-pay | Admitting: Internal Medicine

## 2013-08-25 ENCOUNTER — Other Ambulatory Visit: Payer: Self-pay | Admitting: *Deleted

## 2013-08-25 VITALS — BP 108/68 | HR 62 | Resp 18 | Wt 139.0 lb

## 2013-08-25 DIAGNOSIS — R7989 Other specified abnormal findings of blood chemistry: Secondary | ICD-10-CM

## 2013-08-25 DIAGNOSIS — R197 Diarrhea, unspecified: Secondary | ICD-10-CM

## 2013-08-25 DIAGNOSIS — R946 Abnormal results of thyroid function studies: Secondary | ICD-10-CM

## 2013-08-25 NOTE — Progress Notes (Signed)
Subjective:    Patient ID: Patricia Conley, female    DOB: Mar 25, 1951, 63 y.o.   MRN: [id number]  HPI Terran is here for follow up on her diarrhea and supressed TSH.   Diarrhea comes and goes,  Reports when she take Immodium she is  "plugged up for two days".  Bacterial cultrure neg.   C diff pending  See TSH   She does note intermittant palpitations .  No documented weight loss - she has gained 2 lbs since last visit.  No visual changes but does not upper eyelid twitching    No Known Allergies Past Medical History  Diagnosis Date  . Menopause   . Osteopenia   . Gall bladder disease     per pt report  . GERD (gastroesophageal reflux disease)   . Hemorrhoids   . Arthritis    Past Surgical History  Procedure Laterality Date  . Nasal sinus surgery  1995, 2002  . Cystectomy  1999    Right elbow  . Breast surgery  2002    cyst on Left breast  . Total hip arthroplasty  09/12/2011    Procedure: TOTAL HIP ARTHROPLASTY ANTERIOR APPROACH;  Surgeon: Mauri Pole, MD;  Location: [facility];  Service: Orthopedics;  Laterality: Left;  . Joint replacement     History   Social History  . Marital Status: Divorced    Spouse Name: N/A    Number of Children: N/A  . Years of Education: N/A   Occupational History  . Not on file.   Social History Main Topics  . Smoking status: Never Smoker   . Smokeless tobacco: Never Used  . Alcohol Use: 1.0 oz/week    2 drink(s) per week  . Drug Use: No  . Sexual Activity: Not Currently   Other Topics Concern  . Not on file   Social History Narrative  . No narrative on file   Family History  Problem Relation Age of Onset  . Stroke Mother   . Nephrolithiasis Mother   . Diabetes Mother   . Aneurysm Mother     x2  . Cancer Mother     lymphoma, breast cancer  . Depression Father   . Parkinsonism Father   . Drug abuse Father   . Mental illness Sister     paranoia   Patient Active Problem List   Diagnosis Date Noted  . Abnormal TSH  08/25/2013  . Pain in joint, ankle and foot 12/18/2012  . Epistaxis 11/20/2012  . Bunion of great toe 11/15/2012  . Arthralgia of ankle or foot 11/15/2012  . Sinus bradycardia by electrocardiogram 11/11/2012  . DJD (degenerative joint disease) 11/11/2012  . History of gout 11/11/2012  . GERD (gastroesophageal reflux disease) 10/27/2012  . Vaginal bleeding 04/30/2012  . S/P left THA, AA 09/12/2011  . Hemorrhoids, external without complications 88/41/6606  . Renal cyst 12/15/2010  . Adrenal nodule 12/15/2010  . Liver hemangioma 12/15/2010  . Benign breast cyst in female   . Menopause   . Osteopenia   . Gall bladder disease    Current Outpatient Prescriptions on File Prior to Visit  Medication Sig Dispense Refill  . CALCIUM-MAGNESIUM-VITAMIN D ER PO Take 1 tablet by mouth daily.      . Coenzyme Q10 (CO Q-10) 50 MG CAPS Take 50 mg by mouth daily.      . Magnesium 500 MG CAPS Take 500 mg by mouth daily.      . Potassium 99 MG TABS  Take 99 mg by mouth daily.      . Zinc Sulfate (ZINC 15 PO) Take 1 tablet by mouth daily.      Marland Kitchen b complex vitamins capsule Take 1 capsule by mouth daily.        . vitamin C (ASCORBIC ACID) 500 MG tablet Take 500 mg by mouth daily.       No current facility-administered medications on file prior to visit.       Review of Systems    see HPI Objective:   Physical Exam Physical Exam  Nursing note and vitals reviewed.  Constitutional: She is oriented to person, place, and time. She appears well-developed and well-nourished.  HENT:  Head: Normocephalic and atraumatic.  Cardiovascular: Normal rate and regular rhythm. Exam reveals no gallop and no friction rub.  No murmur heard.  Pulmonary/Chest: Breath sounds normal. She has no wheezes. She has no rales.  Neurological: She is alert and oriented to person, place, and time.  Skin: Skin is warm and dry.  Psychiatric: She has a normal mood and affect. Her behavior is normal.        Assessment &  Plan:  supressed TSH  Will get nuclear scan,  ANA,  Thyroid antibodies  Refer to endocrine  Diarrhea may be exacerbated by supressed TSH.  She will be seeing her GI MD in the next few weeks.

## 2013-08-25 NOTE — Patient Instructions (Signed)
To lab today  Set up nuclear medicine thyroid scan and uptake at [facility]

## 2013-08-26 LAB — ANA: Anti Nuclear Antibody(ANA): NEGATIVE

## 2013-08-26 LAB — T3, FREE: T3, Free: 2.8 pg/mL (ref 2.3–4.2)

## 2013-08-27 LAB — THYROTROPIN RECEPTOR AUTOABS: Thyrotropin Receptor Ab: <6 % (ref <=16.0)

## 2013-08-28 ENCOUNTER — Encounter (HOSPITAL_COMMUNITY): Payer: Self-pay | Admitting: Pharmacy Technician

## 2013-08-28 ENCOUNTER — Encounter: Payer: Self-pay | Admitting: *Deleted

## 2013-08-28 ENCOUNTER — Encounter: Payer: Self-pay | Admitting: Internal Medicine

## 2013-09-01 ENCOUNTER — Telehealth: Payer: Self-pay | Admitting: *Deleted

## 2013-09-01 NOTE — Telephone Encounter (Signed)
Pt is concerned about thyroid scan on wed. Pt has several questions that she would like answered before the test. Attempted to answer questions but pt still unsure, she  Would like for Dr. Coralyn Mark to call her before her test on Wed.

## 2013-09-02 NOTE — Telephone Encounter (Signed)
Returned call to pt.  Left message that I advised test to be done

## 2013-09-03 ENCOUNTER — Encounter (HOSPITAL_COMMUNITY)
Admission: RE | Admit: 2013-09-03 | Discharge: 2013-09-03 | Disposition: A | Discharge status: Home / Self Care | Payer: BC Managed Care – PPO | Source: Ambulatory Visit | Attending: Internal Medicine | Admitting: Internal Medicine

## 2013-09-03 ENCOUNTER — Encounter (HOSPITAL_COMMUNITY): Payer: BC Managed Care – PPO

## 2013-09-03 DIAGNOSIS — R7989 Other specified abnormal findings of blood chemistry: Secondary | ICD-10-CM

## 2013-09-03 DIAGNOSIS — R946 Abnormal results of thyroid function studies: Secondary | ICD-10-CM | POA: Insufficient documentation

## 2013-09-03 NOTE — Patient Instructions (Signed)
Your procedure is scheduled on:  09/10/13  [facility]  Report to [place] at   0830    AM.  Call this number if you have problems the morning of surgery: [phone]        Do not eat food  Or drink :After Midnight. Tuesday NIGHT   Take these medicines the morning of surgery with A SIP OF WATER: NONE   .  Contacts, dentures or partial plates, or metal hairpins  can not be worn to surgery. Your family will be responsible for glasses, dentures, hearing aides while you are in surgery  Leave suitcase in the car. After surgery it may be brought to your room.  For patients admitted to the hospital, checkout time is 11:00 AM day of  discharge.                DO NOT WEAR JEWELRY, LOTIONS, POWDERS, OR PERFUMES.  WOMEN-- DO NOT SHAVE LEGS OR UNDERARMS FOR 48 HOURS BEFORE SHOWERS. MEN MAY SHAVE FACE.  Patients discharged the day of surgery will not be allowed to drive home. IF going home the day of surgery, you must have a driver and someone to stay with you for the first 24 hours  Name and phone number of your driver:                                                                                                                                         [facility] - Preparing for Surgery Before surgery, you can play an important role.  Because skin is not sterile, your skin needs to be as free of germs as possible.  You can reduce the number of germs on your skin by washing with CHG (chlorahexidine gluconate) soap before surgery.  CHG is an antiseptic cleaner which kills germs and bonds with the skin to continue killing germs even after washing. Please DO NOT use if you have an allergy to CHG or antibacterial soaps.  If your skin becomes reddened/irritated stop using the CHG and inform your nurse when you arrive at [facility]. Do not shave (including legs and underarms) for at least 48 hours prior to the first CHG shower.  You may shave your face. Please follow these  instructions carefully:  1.  Shower with CHG Soap the night before surgery and the  morning of Surgery.  2.  If you choose to wash your hair, wash your hair first as usual with your  normal  shampoo.  3.  After you shampoo, rinse your hair and body thoroughly to remove the  shampoo.                           4.  Use CHG as you would any other liquid soap.  You can apply chg directly  to the skin  and wash                       Gently with a scrungie or clean washcloth.  5.  Apply the CHG Soap to your body ONLY FROM THE NECK DOWN.   Do not use on open                           Wound or open sores. Avoid contact with eyes, ears mouth and genitals (private parts).                        Genitals (private parts) with your normal soap.             6.  Wash thoroughly, paying special attention to the area where your surgery  will be performed.  7.  Thoroughly rinse your body with warm water from the neck down.  8.  DO NOT shower/wash with your normal soap after using and rinsing off  the CHG Soap.                9.  Pat yourself dry with a clean towel.            10.  Wear clean pajamas.            11.  Place clean sheets on your bed the night of your first shower and do not  sleep with pets. Day of Surgery : Do not apply any lotions/deodorants the morning of surgery.  Please wear clean clothes to the hospital/surgery center.  FAILURE TO FOLLOW THESE INSTRUCTIONS MAY RESULT IN THE CANCELLATION OF YOUR SURGERY PATIENT SIGNATURE_________________________________  NURSE SIGNATURE__________________________________  ________________________________________________________________________   Patricia Conley  An incentive spirometer is a tool that can help keep your lungs clear and active. This tool measures how well you are filling your lungs with each breath. Taking long deep breaths may help reverse or decrease the chance of developing breathing (pulmonary) problems (especially infection)  following:  A long period of time when you are unable to move or be active. BEFORE THE PROCEDURE   If the spirometer includes an indicator to show your best effort, your nurse or respiratory therapist will set it to a desired goal.  If possible, sit up straight or lean slightly forward. Try not to slouch.  Hold the incentive spirometer in an upright position. INSTRUCTIONS FOR USE  1. Sit on the edge of your bed if possible, or sit up as far as you can in bed or on a chair. 2. Hold the incentive spirometer in an upright position. 3. Breathe out normally. 4. Place the mouthpiece in your mouth and seal your lips tightly around it. 5. Breathe in slowly and as deeply as possible, raising the piston or the ball toward the top of the column. 6. Hold your breath for 3-5 seconds or for as long as possible. Allow the piston or ball to fall to the bottom of the column. 7. Remove the mouthpiece from your mouth and breathe out normally. 8. Rest for a few seconds and repeat Steps 1 through 7 at least 10 times every 1-2 hours when you are awake. Take your time and take a few normal breaths between deep breaths. 9. The spirometer may include an indicator to show your best effort. Use the indicator as a goal to work toward during each repetition. 10. After each set of 10 deep breaths, practice  coughing to be sure your lungs are clear. If you have an incision (the cut made at the time of surgery), support your incision when coughing by placing a pillow or rolled up towels firmly against it. Once you are able to get out of bed, walk around indoors and cough well. You may stop using the incentive spirometer when instructed by your caregiver.  RISKS AND COMPLICATIONS  Take your time so you do not get dizzy or light-headed.  If you are in pain, you may need to take or ask for pain medication before doing incentive spirometry. It is harder to take a deep breath if you are having pain. AFTER USE  Rest and  breathe slowly and easily.  It can be helpful to keep track of a log of your progress. Your caregiver can provide you with a simple table to help with this. If you are using the spirometer at home, follow these instructions: [place] IF:   You are having difficultly using the spirometer.  You have trouble using the spirometer as often as instructed.  Your pain medication is not giving enough relief while using the spirometer.  You develop fever of 100.5 F (38.1 C) or higher. SEEK IMMEDIATE MEDICAL CARE IF:   You cough up bloody sputum that had not been present before.  You develop fever of 102 F (38.9 C) or greater.  You develop worsening pain at or near the incision site. MAKE SURE YOU:   Understand these instructions.  Will watch your condition.  Will get help right away if you are not doing well or get worse. Document Released: 08/21/2006 Document Revised: 07/03/2011 Document Reviewed: 10/22/2006 [facility] Patient Information 2014 [place], [place].   ________________________________________________________________________

## 2013-09-04 ENCOUNTER — Encounter (HOSPITAL_COMMUNITY): Payer: Self-pay

## 2013-09-04 ENCOUNTER — Encounter (HOSPITAL_COMMUNITY)
Admission: RE | Admit: 2013-09-04 | Discharge: 2013-09-04 | Disposition: A | Discharge status: Home / Self Care | Payer: BC Managed Care – PPO | Source: Ambulatory Visit | Attending: Internal Medicine | Admitting: Internal Medicine

## 2013-09-04 ENCOUNTER — Encounter (HOSPITAL_COMMUNITY)
Admission: RE | Admit: 2013-09-04 | Discharge: 2013-09-04 | Disposition: A | Discharge status: Home / Self Care | Payer: BC Managed Care – PPO | Source: Ambulatory Visit | Attending: Orthopedic Surgery | Admitting: Orthopedic Surgery

## 2013-09-04 DIAGNOSIS — Z01818 Encounter for other preprocedural examination: Secondary | ICD-10-CM | POA: Insufficient documentation

## 2013-09-04 HISTORY — DX: Other sprain of unspecified hip, initial encounter: S73.199A

## 2013-09-04 MED ORDER — SODIUM IODIDE I 131 CAPSULE: 8.4000 | Freq: Once | INTRAVENOUS | Status: AC | PRN

## 2013-09-04 MED ORDER — SODIUM PERTECHNETATE TC 99M INJECTION: 10.1000 | Freq: Once | INTRAVENOUS | Status: AC | PRN

## 2013-09-04 NOTE — Progress Notes (Signed)
PST VISIT-  Patient states has not been seen since this was scheduled and has questions.  Operative consent not signed and states will call D Dara Lords

## 2013-09-09 ENCOUNTER — Telehealth: Payer: Self-pay | Admitting: *Deleted

## 2013-09-09 ENCOUNTER — Encounter: Payer: Self-pay | Admitting: Internal Medicine

## 2013-09-09 DIAGNOSIS — R946 Abnormal results of thyroid function studies: Secondary | ICD-10-CM

## 2013-09-09 NOTE — Telephone Encounter (Signed)
Patricia Conley called wanting the results from her thyroid test.  She said she is having surgery tomorrow on her knee and would like them before her surgery.

## 2013-09-09 NOTE — Telephone Encounter (Signed)
Spoke with pt and informed of thyroid nuclear scan results  No symptoms of palpitations weight loss  Anxiety  Will get thyroid ultrasound  She is having knee arthroscopy in am.   Will see her in office next week will need referral to endocrine

## 2013-09-09 NOTE — Telephone Encounter (Signed)
Notified pt of appt to see Dr Letta Median  via VM requested pt return call for more information. Pt has an appt with Dr Letta Median 05/29 at 1:45

## 2013-09-09 NOTE — H&P (Signed)
  CC- Patricia Conley is a 63 y.o. female who presents with left knee pain.  HPI- . Knee Pain: Patient presents with knee pain involving the  left knee. Onset of the symptoms was several months ago. Inciting event: none known. Current symptoms include giving out, pain located medially and stiffness. Pain is aggravated by lateral movements, pivoting, rising after sitting and squatting.  Patient has had no prior knee problems. Evaluation to date: MRI: abnormal medial meniscal tear. Treatment to date: rest.  Past Medical History  Diagnosis Date  . Menopause   . Osteopenia   . Gall bladder disease     per pt report  . GERD (gastroesophageal reflux disease)   . Hemorrhoids   . Arthritis   . Acetabular labrum tear     right/ per pt history    Past Surgical History  Procedure Laterality Date  . Nasal sinus surgery  1995, 2002  . Cystectomy  1999    Right elbow  . Breast surgery  2002    cyst on Left breast  . Total hip arthroplasty  09/12/2011    Procedure: TOTAL HIP ARTHROPLASTY ANTERIOR APPROACH;  Surgeon: Mauri Pole, MD;  Location: [facility];  Service: Orthopedics;  Laterality: Left;  . Joint replacement      Prior to Admission medications   Medication Sig Start Date End Date Taking? Authorizing Provider  aspirin-acetaminophen-caffeine (EXCEDRIN MIGRAINE) [phone] MG per tablet Take 1 tablet by mouth every 6 (six) hours as needed for headache.    Historical Provider, MD  cholecalciferol (VITAMIN D) 1000 UNITS tablet Take 1,000 Units by mouth daily.    Historical Provider, MD  Coenzyme Q10 (CO Q-10) 50 MG CAPS Take 50 mg by mouth daily.    Historical Provider, MD  Cyanocobalamin (VITAMIN B 12 PO) Take 1 capsule by mouth daily.    Historical Provider, MD  Magnesium 500 MG CAPS Take 500 mg by mouth daily.    Historical Provider, MD  OVER THE COUNTER MEDICATION Take 5 drops by mouth daily. ESSENTIAL OILS    Historical Provider, MD  Potassium 99 MG TABS Take 99 mg by mouth daily.     Historical Provider, MD  vitamin C (ASCORBIC ACID) 500 MG tablet Take 500 mg by mouth daily.    Historical Provider, MD  Zinc Sulfate (ZINC 15 PO) Take 1 tablet by mouth daily.    Historical Provider, MD   Left Knee Exam antalgic gait, soft tissue tenderness over medial joint line, negative pivot-shift, collateral ligaments intact, negative McMurray sign  Physical Examination: General appearance - alert, well appearing, and in no distress Mental status - alert, oriented to person, place, and time Chest - clear to auscultation, no wheezes, rales or rhonchi, symmetric air entry Heart - normal rate, regular rhythm, normal S1, S2, no murmurs, rubs, clicks or gallops Abdomen - soft, nontender, nondistended, no masses or organomegaly Neurological - alert, oriented, normal speech, no focal findings or movement disorder noted    Asessment/Plan--- Left knee medial meniscal tear- - Plan left knee arthroscopy with meniscal debridement. Procedure risks and potential comps discussed with patient who elects to proceed. Goals are decreased pain and increased function with a high likelihood of achieving both

## 2013-09-10 ENCOUNTER — Ambulatory Visit (HOSPITAL_COMMUNITY)
Admission: RE | Admit: 2013-09-10 | Discharge: 2013-09-10 | Disposition: A | Discharge status: Home / Self Care | Payer: BC Managed Care – PPO | Source: Ambulatory Visit | Attending: Orthopedic Surgery | Admitting: Orthopedic Surgery

## 2013-09-10 ENCOUNTER — Encounter (HOSPITAL_COMMUNITY)
Admission: RE | Disposition: A | Discharge status: Home / Self Care | Payer: Self-pay | Source: Ambulatory Visit | Attending: Orthopedic Surgery

## 2013-09-10 ENCOUNTER — Encounter (HOSPITAL_COMMUNITY): Payer: BC Managed Care – PPO | Admitting: Anesthesiology

## 2013-09-10 ENCOUNTER — Ambulatory Visit (HOSPITAL_COMMUNITY): Payer: BC Managed Care – PPO | Admitting: Anesthesiology

## 2013-09-10 ENCOUNTER — Encounter (HOSPITAL_COMMUNITY): Payer: Self-pay | Admitting: Anesthesiology

## 2013-09-10 DIAGNOSIS — K219 Gastro-esophageal reflux disease without esophagitis: Secondary | ICD-10-CM | POA: Insufficient documentation

## 2013-09-10 DIAGNOSIS — Z96649 Presence of unspecified artificial hip joint: Secondary | ICD-10-CM | POA: Insufficient documentation

## 2013-09-10 DIAGNOSIS — S83249A Other tear of medial meniscus, current injury, unspecified knee, initial encounter: Secondary | ICD-10-CM

## 2013-09-10 DIAGNOSIS — S83289A Other tear of lateral meniscus, current injury, unspecified knee, initial encounter: Secondary | ICD-10-CM | POA: Insufficient documentation

## 2013-09-10 DIAGNOSIS — X58XXXA Exposure to other specified factors, initial encounter: Secondary | ICD-10-CM | POA: Insufficient documentation

## 2013-09-10 DIAGNOSIS — IMO0002 Reserved for concepts with insufficient information to code with codable children: Secondary | ICD-10-CM | POA: Insufficient documentation

## 2013-09-10 HISTORY — PX: KNEE ARTHROSCOPY: SHX127

## 2013-09-10 SURGERY — ARTHROSCOPY, KNEE
Anesthesia: General | Site: Knee | Laterality: Left

## 2013-09-10 MED ORDER — HYDROCODONE-ACETAMINOPHEN 5-325 MG PO TABS
1.0000 | ORAL_TABLET | Freq: Four times a day (QID) | ORAL | Status: DC | PRN
  Administered 2013-09-10: 1 via ORAL
  Filled 2013-09-10: qty 1

## 2013-09-10 MED ORDER — FENTANYL CITRATE 0.05 MG/ML IJ SOLN
INTRAMUSCULAR | Status: DC | PRN
  Administered 2013-09-10 (×2): 25 ug via INTRAVENOUS
  Administered 2013-09-10: 50 ug via INTRAVENOUS

## 2013-09-10 MED ORDER — PROPOFOL 10 MG/ML IV BOLUS
INTRAVENOUS | Status: DC | PRN
  Administered 2013-09-10: 30 mg via INTRAVENOUS
  Administered 2013-09-10: 130 mg via INTRAVENOUS

## 2013-09-10 MED ORDER — METHOCARBAMOL 500 MG PO TABS: 500.0000 mg | ORAL_TABLET | Freq: Four times a day (QID) | ORAL | Status: DC

## 2013-09-10 MED ORDER — MIDAZOLAM HCL 5 MG/5ML IJ SOLN
INTRAMUSCULAR | Status: DC | PRN
  Administered 2013-09-10: 2 mg via INTRAVENOUS

## 2013-09-10 MED ORDER — PROMETHAZINE HCL 25 MG/ML IJ SOLN: 6.2500 mg | INTRAMUSCULAR | Status: DC | PRN

## 2013-09-10 MED ORDER — LACTATED RINGERS IV SOLN
INTRAVENOUS | Status: DC
  Administered 2013-09-10 (×2): via INTRAVENOUS

## 2013-09-10 MED ORDER — ONDANSETRON HCL 4 MG/2ML IJ SOLN
INTRAMUSCULAR | Status: DC | PRN
  Administered 2013-09-10: 4 mg via INTRAVENOUS

## 2013-09-10 MED ORDER — MIDAZOLAM HCL 2 MG/2ML IJ SOLN
INTRAMUSCULAR | Status: AC
  Filled 2013-09-10: qty 2

## 2013-09-10 MED ORDER — MEPERIDINE HCL 50 MG/ML IJ SOLN
6.2500 mg | INTRAMUSCULAR | Status: DC | PRN
  Administered 2013-09-10: 12.5 mg via INTRAVENOUS

## 2013-09-10 MED ORDER — FENTANYL CITRATE 0.05 MG/ML IJ SOLN
INTRAMUSCULAR | Status: AC
  Filled 2013-09-10: qty 2

## 2013-09-10 MED ORDER — SODIUM CHLORIDE 0.9 % IV SOLN: INTRAVENOUS | Status: DC

## 2013-09-10 MED ORDER — METHOCARBAMOL 500 MG PO TABS: 500.0000 mg | ORAL_TABLET | Freq: Four times a day (QID) | ORAL | Status: DC | PRN

## 2013-09-10 MED ORDER — BUPIVACAINE-EPINEPHRINE 0.25% -1:200000 IJ SOLN
INTRAMUSCULAR | Status: DC | PRN
  Administered 2013-09-10: 20 mL

## 2013-09-10 MED ORDER — BUPIVACAINE-EPINEPHRINE (PF) 0.25% -1:200000 IJ SOLN
INTRAMUSCULAR | Status: AC
  Filled 2013-09-10: qty 30

## 2013-09-10 MED ORDER — FENTANYL CITRATE 0.05 MG/ML IJ SOLN
25.0000 ug | INTRAMUSCULAR | Status: DC | PRN
  Administered 2013-09-10: 50 ug via INTRAVENOUS

## 2013-09-10 MED ORDER — ONDANSETRON HCL 4 MG/2ML IJ SOLN
INTRAMUSCULAR | Status: AC
  Filled 2013-09-10: qty 2

## 2013-09-10 MED ORDER — DEXAMETHASONE SODIUM PHOSPHATE 10 MG/ML IJ SOLN
10.0000 mg | Freq: Once | INTRAMUSCULAR | Status: AC
  Administered 2013-09-10: 10 mg via INTRAVENOUS

## 2013-09-10 MED ORDER — SODIUM CHLORIDE 0.9 % IR SOLN
Status: DC | PRN
  Administered 2013-09-10: 3000 mL
  Administered 2013-09-10: 6000 mL
  Administered 2013-09-10: 3000 mL

## 2013-09-10 MED ORDER — DEXAMETHASONE SODIUM PHOSPHATE 10 MG/ML IJ SOLN
INTRAMUSCULAR | Status: AC
  Filled 2013-09-10: qty 1

## 2013-09-10 MED ORDER — HYDROCODONE-ACETAMINOPHEN 5-325 MG PO TABS: 1.0000 | ORAL_TABLET | Freq: Four times a day (QID) | ORAL | Status: DC | PRN

## 2013-09-10 MED ORDER — CEFAZOLIN SODIUM-DEXTROSE 2-3 GM-% IV SOLR
INTRAVENOUS | Status: AC
  Filled 2013-09-10: qty 50

## 2013-09-10 MED ORDER — MEPERIDINE HCL 50 MG/ML IJ SOLN
INTRAMUSCULAR | Status: AC
  Filled 2013-09-10: qty 1

## 2013-09-10 MED ORDER — CEFAZOLIN SODIUM-DEXTROSE 2-3 GM-% IV SOLR
2.0000 g | INTRAVENOUS | Status: AC
  Administered 2013-09-10: 2 g via INTRAVENOUS

## 2013-09-10 MED ORDER — PROPOFOL 10 MG/ML IV BOLUS
INTRAVENOUS | Status: AC
  Filled 2013-09-10: qty 20

## 2013-09-10 MED ORDER — ACETAMINOPHEN 10 MG/ML IV SOLN
1000.0000 mg | Freq: Once | INTRAVENOUS | Status: AC
  Administered 2013-09-10: 1000 mg via INTRAVENOUS
  Filled 2013-09-10: qty 100

## 2013-09-10 SURGICAL SUPPLY — 23 items
BLADE 4.2CUDA (BLADE) ×3 IMPLANT
CLOTH BEACON ORANGE TIMEOUT ST (SAFETY) ×3 IMPLANT
COUNTER NEEDLE 20 DBL MAG RED (NEEDLE) ×3 IMPLANT
CUFF TOURN SGL QUICK 34 (TOURNIQUET CUFF) ×2
CUFF TRNQT CYL 34X4X40X1 (TOURNIQUET CUFF) ×1 IMPLANT
DRAPE U-SHAPE 47X51 STRL (DRAPES) ×3 IMPLANT
DRSG EMULSION OIL 3X3 NADH (GAUZE/BANDAGES/DRESSINGS) ×3 IMPLANT
DURAPREP 26ML APPLICATOR (WOUND CARE) ×3 IMPLANT
GLOVE BIO SURGEON STRL SZ8 (GLOVE) ×3 IMPLANT
GLOVE BIOGEL PI IND STRL 8 (GLOVE) ×1 IMPLANT
GLOVE BIOGEL PI INDICATOR 8 (GLOVE) ×2
GOWN STRL REUS W/TWL LRG LVL3 (GOWN DISPOSABLE) ×3 IMPLANT
MANIFOLD NEPTUNE II (INSTRUMENTS) ×3 IMPLANT
PACK ARTHROSCOPY WL (CUSTOM PROCEDURE TRAY) ×3 IMPLANT
PACK ICE MAXI GEL EZY WRAP (MISCELLANEOUS) ×9 IMPLANT
PADDING CAST COTTON 6X4 STRL (CAST SUPPLIES) ×6 IMPLANT
POSITIONER SURGICAL ARM (MISCELLANEOUS) ×3 IMPLANT
SET ARTHROSCOPY TUBING (MISCELLANEOUS) ×2
SET ARTHROSCOPY TUBING LN (MISCELLANEOUS) ×1 IMPLANT
SUT ETHILON 4 0 PS 2 18 (SUTURE) ×3 IMPLANT
TOWEL OR 17X26 10 PK STRL BLUE (TOWEL DISPOSABLE) ×3 IMPLANT
WAND 90 DEG TURBOVAC W/CORD (SURGICAL WAND) ×3 IMPLANT
WRAP KNEE MAXI GEL POST OP (GAUZE/BANDAGES/DRESSINGS) ×3 IMPLANT

## 2013-09-10 NOTE — Op Note (Signed)
Preoperative diagnosis-  Left knee medial meniscal tear  Postoperative diagnosis Left- knee medial and lateral meniscal tears    Procedure- Left knee arthroscopy with medial and lateral  meniscal debridement    Surgeon- Dione Plover. Jabree Pernice, MD  Anesthesia-General  EBL-  Minimal  Complications- None  Condition- PACU - hemodynamically stable.  Brief clinical note- -Patricia Conley is a 63 y.o.  female with a several month history of left knee pain and mechanical symptoms. Exam and history suggested medial meniscal tear confirmed by MRI. The patient presents now for arthroscopy and debridement   Procedure in detail -       After successful administration of General anesthetic, a tourmiquet is placed high on the Left  thigh and the Left lower extremity is prepped and draped in the usual sterile fashion. Time out is performed by the surgical team. Standard superomedial and inferolateral portal sites are marked and incisions made with an 11 blade. The inflow cannula is passed through the superomedial portal and camera through the inferolateral portal and inflow is initiated. Arthroscopic visualization proceeds.      The undersurface of the patella and trochlea are visualized and there is Grade II and III change of the patella but the trochlea appears normal. The medial and lateral gutters are visualized and there are  no loose bodies. Flexion and valgus force is applied to the knee and the medial compartment is entered. A spinal needle is passed into the joint through the site marked for the inferomedial portal. A small incision is made and the dilator passed into the joint. The findings for the medial compartment are unstable tear of body and posterior horn of the medial meniscus with mild chondromalacia medial femoral condyle . The tear is debrided to a stable base with baskets and a shaver and sealed off with the Arthrocare. It is probed and found to be stable.    The intercondylar notch is  visualized and the ACL appears normal. The lateral compartment is entered and the findings are tear of body of lateral meniscus . The tear is debrided to a stable base with baskets and a shaver and sealed off with the Arthrocare. It is probed and found to be stable.     The joint is again inspected and there are no other tears, defects or loose bodies identified. The arthroscopic equipment is then removed from the inferior portals which are closed with interrupted 4-0 nylon. 20 ml of .25% Marcaine with epinephrine are injected through the inflow cannula and the cannula is then removed and the portal closed with nylon. The incisions are cleaned and dried and a bulky sterile dressing is applied. The patient is then awakened and transported to recovery in stable condition.   09/10/2013, 10:56 AM

## 2013-09-10 NOTE — Interval H&P Note (Signed)
History and Physical Interval Note:  09/10/2013 9:59 AM  Patricia Conley  has presented today for surgery, with the diagnosis of LEFT KNEE MEDIAL MENISCAL TEAR   The various methods of treatment have been discussed with the patient and family. After consideration of risks, benefits and other options for treatment, the patient has consented to  Procedure(s): LEFT KNEE ARTHROSCOPY WITH DEBRIDEMENT  (Left) as a surgical intervention .  The patient's history has been reviewed, patient examined, no change in status, stable for surgery.  I have reviewed the patient's chart and labs.  Questions were answered to the patient's satisfaction.     Dione Plover Advik Weatherspoon

## 2013-09-10 NOTE — Transfer of Care (Signed)
Immediate Anesthesia Transfer of Care Note  Patient: Patricia Conley  Procedure(s) Performed: Procedure(s): LEFT MEDIAL AND LATERAL KNEE ARTHROSCOPY WITH MENISCAL DEBRIDEMENT  (Left)  Patient Location: PACU  Anesthesia Type:General  Level of Consciousness: awake, sedated and patient cooperative  Airway & Oxygen Therapy: Patient Spontanous Breathing and Patient connected to face mask oxygen  Post-op Assessment: Report given to PACU RN and Post -op Vital signs reviewed and stable  Post vital signs: Reviewed and stable  Complications: No apparent anesthesia complications

## 2013-09-10 NOTE — Anesthesia Preprocedure Evaluation (Signed)
Anesthesia Evaluation  Patient identified by MRN, date of birth, ID band Patient awake    Reviewed: Allergy & Precautions, H&P , NPO status , Patient's Chart, lab work & pertinent test results  Airway Mallampati: II TM Distance: >3 FB Neck ROM: Full    Dental no notable dental hx.    Pulmonary neg pulmonary ROS,  breath sounds clear to auscultation  Pulmonary exam normal       Cardiovascular negative cardio ROS  Rhythm:Regular Rate:Normal     Neuro/Psych negative neurological ROS  negative psych ROS   GI/Hepatic Neg liver ROS, GERD-  ,  Endo/Other  negative endocrine ROS  Renal/GU Renal disease  negative genitourinary   Musculoskeletal negative musculoskeletal ROS (+)   Abdominal   Peds negative pediatric ROS (+)  Hematology negative hematology ROS (+)   Anesthesia Other Findings   Reproductive/Obstetrics negative OB ROS                           Anesthesia Physical Anesthesia Plan  ASA: II  Anesthesia Plan: General   Post-op Pain Management:    Induction: Intravenous  Airway Management Planned: LMA  Additional Equipment:   Intra-op Plan:   Post-operative Plan: Extubation in OR  Informed Consent: I have reviewed the patients History and Physical, chart, labs and discussed the procedure including the risks, benefits and alternatives for the proposed anesthesia with the patient or authorized representative who has indicated his/her understanding and acceptance.   Dental advisory given  Plan Discussed with: CRNA  Anesthesia Plan Comments:         Anesthesia Quick Evaluation

## 2013-09-10 NOTE — Discharge Instructions (Signed)

## 2013-09-10 NOTE — Anesthesia Postprocedure Evaluation (Signed)
  Anesthesia Post-op Note  Patient: Patricia Conley  Procedure(s) Performed: Procedure(s) (LRB): LEFT MEDIAL AND LATERAL KNEE ARTHROSCOPY WITH MENISCAL DEBRIDEMENT  (Left)  Patient Location: PACU  Anesthesia Type: General  Level of Consciousness: awake and alert   Airway and Oxygen Therapy: Patient Spontanous Breathing  Post-op Pain: mild  Post-op Assessment: Post-op Vital signs reviewed, Patient's Cardiovascular Status Stable, Respiratory Function Stable, Patent Airway and No signs of Nausea or vomiting  Last Vitals:  Filed Vitals:   09/10/13 1330  BP: 116/70  Pulse: 56  Temp: 36.3 C  Resp: 20    Post-op Vital Signs: stable   Complications: No apparent anesthesia complications

## 2013-09-11 ENCOUNTER — Encounter (HOSPITAL_COMMUNITY): Payer: Self-pay | Admitting: Orthopedic Surgery

## 2013-09-16 ENCOUNTER — Encounter: Payer: Self-pay | Admitting: Internal Medicine

## 2013-09-16 ENCOUNTER — Ambulatory Visit (HOSPITAL_BASED_OUTPATIENT_CLINIC_OR_DEPARTMENT_OTHER)
Admission: RE | Admit: 2013-09-16 | Discharge: 2013-09-16 | Disposition: A | Discharge status: Home / Self Care | Payer: BC Managed Care – PPO | Source: Ambulatory Visit | Attending: Internal Medicine | Admitting: Internal Medicine

## 2013-09-16 ENCOUNTER — Ambulatory Visit: Payer: BC Managed Care – PPO | Admitting: Internal Medicine

## 2013-09-16 ENCOUNTER — Ambulatory Visit (INDEPENDENT_AMBULATORY_CARE_PROVIDER_SITE_OTHER): Payer: BC Managed Care – PPO | Admitting: Internal Medicine

## 2013-09-16 VITALS — BP 109/70 | HR 92 | Temp 98.4°F | Resp 18 | Wt 136.0 lb

## 2013-09-16 DIAGNOSIS — E042 Nontoxic multinodular goiter: Secondary | ICD-10-CM | POA: Insufficient documentation

## 2013-09-16 DIAGNOSIS — E0520 Thyrotoxicosis with toxic multinodular goiter without thyrotoxic crisis or storm: Secondary | ICD-10-CM | POA: Insufficient documentation

## 2013-09-16 DIAGNOSIS — R946 Abnormal results of thyroid function studies: Secondary | ICD-10-CM

## 2013-09-16 DIAGNOSIS — R599 Enlarged lymph nodes, unspecified: Secondary | ICD-10-CM | POA: Insufficient documentation

## 2013-09-16 NOTE — Progress Notes (Signed)
Subjective:    Patient ID: Patricia Conley, female    DOB: July 03, 1950, 63 y.o.   MRN: [id number]  HPI  Patricia Conley is here for follow up - she is with her daughter today  Recent  Arthroscopy of left knee.  She is doing well with this.  See thyroid U/S   Largest dimension to hot nodule is 3 cm in left thyroid.  She denies neck irradiation,   No FH of thyroid cancer.   No weight loss, no palpitations.   No Known Allergies Past Medical History  Diagnosis Date  . Menopause   . Osteopenia   . Gall bladder disease     per pt report  . GERD (gastroesophageal reflux disease)   . Hemorrhoids   . Arthritis   . Acetabular labrum tear     right/ per pt history   Past Surgical History  Procedure Laterality Date  . Nasal sinus surgery  1995, 2002  . Cystectomy  1999    Right elbow  . Breast surgery  2002    cyst on Left breast  . Total hip arthroplasty  09/12/2011    Procedure: TOTAL HIP ARTHROPLASTY ANTERIOR APPROACH;  Surgeon: Mauri Pole, MD;  Location: [facility];  Service: Orthopedics;  Laterality: Left;  . Joint replacement    . Knee arthroscopy Left 09/10/2013    Procedure: LEFT MEDIAL AND LATERAL KNEE ARTHROSCOPY WITH MENISCAL DEBRIDEMENT ;  Surgeon: Gearlean Alf, MD;  Location: [facility];  Service: Orthopedics;  Laterality: Left;   History   Social History  . Marital Status: Divorced    Spouse Name: N/A    Number of Children: N/A  . Years of Education: N/A   Occupational History  . Not on file.   Social History Main Topics  . Smoking status: Never Smoker   . Smokeless tobacco: Never Used  . Alcohol Use: 0.5 oz/week    1 drink(s) per week  . Drug Use: No  . Sexual Activity: Not Currently   Other Topics Concern  . Not on file   Social History Narrative  . No narrative on file   Family History  Problem Relation Age of Onset  . Stroke Mother   . Nephrolithiasis Mother   . Diabetes Mother   . Aneurysm Mother     x2  . Cancer Mother     lymphoma, breast  cancer  . Depression Father   . Parkinsonism Father   . Drug abuse Father   . Mental illness Sister     paranoia   Patient Active Problem List   Diagnosis Date Noted  . Acute medial meniscal tear 09/10/2013  . Abnormal TSH 08/25/2013  . Pain in joint, ankle and foot 12/18/2012  . Epistaxis 11/20/2012  . Bunion of great toe 11/15/2012  . Arthralgia of ankle or foot 11/15/2012  . Sinus bradycardia by electrocardiogram 11/11/2012  . DJD (degenerative joint disease) 11/11/2012  . History of gout 11/11/2012  . GERD (gastroesophageal reflux disease) 10/27/2012  . Vaginal bleeding 04/30/2012  . S/P left THA, AA 09/12/2011  . Hemorrhoids, external without complications 02/18/2535  . Renal cyst 12/15/2010  . Adrenal nodule 12/15/2010  . Liver hemangioma 12/15/2010  . Benign breast cyst in female   . Menopause   . Osteopenia   . Gall bladder disease    Current Outpatient Prescriptions on File Prior to Visit  Medication Sig Dispense Refill  . aspirin-acetaminophen-caffeine (EXCEDRIN MIGRAINE) 250-250-65 MG per tablet Take 1 tablet by mouth  every 6 (six) hours as needed for headache.      . cholecalciferol (VITAMIN D) 1000 UNITS tablet Take 1,000 Units by mouth daily.      . Coenzyme Q10 (CO Q-10) 50 MG CAPS Take 50 mg by mouth daily.      . Cyanocobalamin (VITAMIN B 12 PO) Take 1 capsule by mouth daily.      Marland Kitchen HYDROcodone-acetaminophen (NORCO) 5-325 MG per tablet Take 1-2 tablets by mouth every 6 (six) hours as needed for moderate pain.  40 tablet  0  . Magnesium 500 MG CAPS Take 500 mg by mouth daily.      . methocarbamol (ROBAXIN) 500 MG tablet Take 1 tablet (500 mg total) by mouth 4 (four) times daily. As needed for muscle spasm  30 tablet  1  . OVER THE COUNTER MEDICATION Take 5 drops by mouth daily. ESSENTIAL OILS      . Potassium 99 MG TABS Take 99 mg by mouth daily.      . valACYclovir (VALTREX) 500 MG tablet Take 500 mg by mouth 2 (two) times daily.      . vitamin C (ASCORBIC  ACID) 500 MG tablet Take 500 mg by mouth daily.      . Zinc Sulfate (ZINC 15 PO) Take 1 tablet by mouth daily.       No current facility-administered medications on file prior to visit.           Review of Systems See HPI    Objective:   Physical Exam Physical Exam  Nursing note and vitals reviewed.  Constitutional: She is oriented to person, place, and time. She appears well-developed and well-nourished.  HENT:  Head: Normocephalic and atraumatic.  Cardiovascular: Normal rate and regular rhythm. Exam reveals no gallop and no friction rub.  No murmur heard.  Pulmonary/Chest: Breath sounds normal. She has no wheezes. She has no rales.  Neurological: She is alert and oriented to person, place, and time.  Skin: Skin is warm and dry.  Psychiatric: She has a normal mood and affect. Her behavior is normal.             Assessment & Plan:  HOt nodule  3 cm  Right thyroid  :  Pt has upcoming appt with endocrinology later this week.  She is at low risk for thyroid carcinoma but with size of lesion and that it is a hot nodule will leave to endocrine discretion regarding biopsy and further treatment

## 2013-09-19 ENCOUNTER — Ambulatory Visit (INDEPENDENT_AMBULATORY_CARE_PROVIDER_SITE_OTHER): Payer: BC Managed Care – PPO | Admitting: Internal Medicine

## 2013-09-19 ENCOUNTER — Encounter: Payer: Self-pay | Admitting: Internal Medicine

## 2013-09-19 VITALS — BP 112/68 | HR 79 | Temp 98.3°F | Resp 12 | Ht 67.0 in | Wt 137.4 lb

## 2013-09-19 DIAGNOSIS — E042 Nontoxic multinodular goiter: Secondary | ICD-10-CM

## 2013-09-19 LAB — TSH: TSH: 0.12 u[IU]/mL — ABNORMAL LOW (ref 0.35–4.50)

## 2013-09-19 LAB — T3, FREE: T3 FREE: 3.3 pg/mL (ref 2.3–4.2)

## 2013-09-19 LAB — T4, FREE: Free T4: 0.74 ng/dL (ref 0.60–1.60)

## 2013-09-19 NOTE — Patient Instructions (Signed)
Please stop at the lab. We will decide about further plan when the results are back. Please come back for a follow-up appointment in 6 months but please let me know in the meantime if you develop the following symptoms.  Hyperthyroidism The thyroid is a large gland located in the lower front part of your neck. The thyroid helps control metabolism. Metabolism is how your body uses food. It controls metabolism with the hormone thyroxine. When the thyroid is overactive, it produces too much hormone. When this happens, these following problems may occur:   Nervousness  Heat intolerance  Weight loss (in spite of increase food intake)  Diarrhea  Change in hair or skin texture  Palpitations (heart skipping or having extra beats)  Tachycardia (rapid heart rate)  Loss of menstruation (amenorrhea)  Shaking of the hands CAUSES  Grave's Disease (the immune system attacks the thyroid gland). This is the most common cause.  Inflammation of the thyroid gland.  Tumor (usually benign) in the thyroid gland or elsewhere.  Excessive use of thyroid medications (both prescription and 'natural').  Excessive ingestion of Iodine. DIAGNOSIS  To prove hyperthyroidism, your caregiver may do blood tests and ultrasound tests. Sometimes the signs are hidden. It may be necessary for your caregiver to watch this illness with blood tests, either before or after diagnosis and treatment. TREATMENT Kestler-term treatment There are several treatments to control symptoms. Drugs called beta blockers may give some relief. Drugs that decrease hormone production will provide temporary relief in many people. These measures will usually not give permanent relief. Definitive therapy There are treatments available which can be discussed between you and your caregiver which will permanently treat the problem. These treatments range from surgery (removal of the thyroid), to the use of radioactive iodine (destroys the thyroid  by radiation), to the use of antithyroid drugs (interfere with hormone synthesis). The first two treatments are permanent and usually successful. They most often require hormone replacement therapy for life. This is because it is impossible to remove or destroy the exact amount of thyroid required to make a person euthyroid (normal). HOME CARE INSTRUCTIONS  See your caregiver if the problems you are being treated for get worse. Examples of this would be the problems listed above. SEEK MEDICAL CARE IF: Your general condition worsens. MAKE SURE YOU:   Understand these instructions.  Will watch your condition.  Will get help right away if you are not doing well or get worse. Document Released: 04/10/2005 Document Revised: 07/03/2011 Document Reviewed: 08/22/2006 [facility] Patient Information 2014 [place], [place].

## 2013-09-19 NOTE — Progress Notes (Signed)
Patient ID: Patricia Conley, female   DOB: October 23, 1950, 63 y.o.   MRN: [id number]   HPI  Patricia Conley is a 63 y.o.-year-old female, referred by her PCP, Dr. Coralyn Mark, for management of toxic multinodular goiter.  Pt had diarrhea x 3 days >> saw PCP: had a low TSH and an Uptake and scan showed low absorption (10.6%) with a 3 cm hot L thyroid nodule. Additionally, there were some warm thyroid nodules, subcm. An Ultrasound showed that the largest nodule was 3 cm.  I reviewed pt's thyroid tests: Lab Results  Component Value Date   TSH 0.255* 08/11/2013   TSH 0.623 11/11/2012   TSH 0.528 11/18/2011   TSH 0.515 09/05/2011   TSH 1.275 11/16/2010   FREET4 1.03 08/11/2013    Pt denies feeling nodules in neck, hoarseness, dysphagia/odynophagia, SOB with lying down; she c/o: - no excessive sweating/heat intolerance - no weight loss - no tremors - + anxiety - occasional palpitations - + fatigue, more in last 5-6 months - no hyperdefecation (one episode) - mm twiching  Pt does have a FH of thyroid ds.: sister - hyperthyroidism, 2 sisters - hypothyroidism. No FH of thyroid cancer. No h/o radiation tx to head or neck.  No seaweed or kelp, no recent contrast studies. No steroid use. No herbal supplements - takes Essential oils ([employer], [place])   ROS: Constitutional: see HPI, + nocturia, + poor sleep, occasional hot flushes Eyes: no blurry vision, no xerophthalmia ENT: no sore throat, no nodules palpated in throat, no dysphagia/odynophagia, no hoarseness Cardiovascular: no CP/SOB/palpitations/leg swelling Respiratory: no cough/SOB Gastrointestinal: no N/V/resolved D/C Musculoskeletal: + muscle/+ joint aches (had L knee meniscus sx last week) Skin: no rashes, + easy bruising Neurological: no tremors/numbness/tingling/dizziness Psychiatric: no depression/anxiety  Past Medical History  Diagnosis Date  . Menopause   . Osteopenia   . Gall bladder disease     per pt report  . GERD  (gastroesophageal reflux disease)   . Hemorrhoids   . Arthritis   . Acetabular labrum tear     right/ per pt history   Past Surgical History  Procedure Laterality Date  . Nasal sinus surgery  1995, 2002  . Cystectomy  1999    Right elbow  . Breast surgery  2002    cyst on Left breast  . Total hip arthroplasty  09/12/2011    Procedure: TOTAL HIP ARTHROPLASTY ANTERIOR APPROACH;  Surgeon: Mauri Pole, MD;  Location: [facility];  Service: Orthopedics;  Laterality: Left;  . Joint replacement    . Knee arthroscopy Left 09/10/2013    Procedure: LEFT MEDIAL AND LATERAL KNEE ARTHROSCOPY WITH MENISCAL DEBRIDEMENT ;  Surgeon: Gearlean Alf, MD;  Location: [facility];  Service: Orthopedics;  Laterality: Left;   History   Social History  . Marital Status: Divorced    Spouse Name: N/A    Number of Children: 1   Occupational History  . accountant   Social History Main Topics  . Smoking status: Never Smoker   . Smokeless tobacco: Never Used  . Alcohol Use: 0.5 oz/week    1 drink(s) per week  . Drug Use: No   Current Outpatient Prescriptions on File Prior to Visit  Medication Sig Dispense Refill  . aspirin-acetaminophen-caffeine (EXCEDRIN MIGRAINE) 250-250-65 MG per tablet Take 1 tablet by mouth every 6 (six) hours as needed for headache.      . cholecalciferol (VITAMIN D) 1000 UNITS tablet Take 1,000 Units by mouth daily.      Marland Kitchen  Coenzyme Q10 (CO Q-10) 50 MG CAPS Take 50 mg by mouth daily.      . Cyanocobalamin (VITAMIN B 12 PO) Take 1 capsule by mouth daily.      Marland Kitchen HYDROcodone-acetaminophen (NORCO) 5-325 MG per tablet Take 1-2 tablets by mouth every 6 (six) hours as needed for moderate pain.  40 tablet  0  . Magnesium 500 MG CAPS Take 500 mg by mouth daily.      . methocarbamol (ROBAXIN) 500 MG tablet Take 1 tablet (500 mg total) by mouth 4 (four) times daily. As needed for muscle spasm  30 tablet  1  . OVER THE COUNTER MEDICATION Take 5 drops by mouth daily. ESSENTIAL OILS      . Potassium  99 MG TABS Take 99 mg by mouth daily.      . valACYclovir (VALTREX) 500 MG tablet Take 500 mg by mouth 2 (two) times daily.      . vitamin C (ASCORBIC ACID) 500 MG tablet Take 500 mg by mouth daily.      . Zinc Sulfate (ZINC 15 PO) Take 1 tablet by mouth daily.       No current facility-administered medications on file prior to visit.   No Known Allergies Family History  Problem Relation Age of Onset  . Stroke Mother   . Nephrolithiasis Mother   . Diabetes Mother   . Aneurysm Mother     x2  . Cancer Mother     lymphoma, breast cancer  . Depression Father   . Parkinsonism Father   . Drug abuse Father   . Mental illness Sister     paranoia   PE: BP 112/68  Pulse 79  Temp(Src) 98.3 F (36.8 C) (Oral)  Resp 12  Ht 5\' 7"  (1.702 m)  Wt 137 lb 6.4 oz (62.324 kg)  BMI 21.51 kg/m2  SpO2 98% Wt Readings from Last 3 Encounters:  09/19/13 137 lb 6.4 oz (62.324 kg)  09/16/13 136 lb (61.689 kg)  09/04/13 135 lb (61.236 kg)   Constitutional: normal weight, in NAD Eyes: PERRLA, EOMI, no exophthalmos, no lid lag, no stare ENT: moist mucous membranes, no thyromegaly L>R, no thyroid bruits, no cervical lymphadenopathy Cardiovascular: RRR, No MRG Respiratory: CTA B Gastrointestinal: abdomen soft, NT, ND, BS+ Musculoskeletal: no deformities, strength intact in all 4 Skin: moist, warm, no rashes Neurological: fine tremor with outstretched hands, DTR normal in all 4  ASSESSMENT: 1. 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 Patient with a recently found low TSH, without thyrotoxic sxs. She had an Uptake and scan that showed a large L toxic adenoma. An U/S showed possible calcifications, but we reviewed the images together and the bright spots appear colloid crystals (had "comet tail), which is a sign of benignity. Pt was reassured.  - I suggested that we check the TSH, fT3 and fT4 today >> decide for management when these are back. - if the TSH is normal, we might be able to  just follow the toxic adenoma and check labs and only intervene if her TSH starts to decrease. - If the TSH remains abnormal, depending on the magnitude, we might need to do radioactive iodine ablation or hemithyroidectomy. I explained that the risks with the above procedures can be hypothyroidism. If she starts to have hyperthyroid symptoms, addition of methimazole would be indicated, but I explained that this is only a temporary solution, and not definitive treatment.  She agrees with the above plan and  she also prefers a more conservative approach to her condition - I do not feel that we need to add beta blockers at this time, since she is not tachycardic, anxious, or tremulous - RTC in 6 months, but likely sooner for repeat labs  Office Visit on 09/19/2013  Component Date Value Ref Range Status  . TSH 09/19/2013 0.12* 0.35 - 4.50 uIU/mL Final  . Free T4 09/19/2013 0.74  0.60 - 1.60 ng/dL Final  . T3, Free 09/19/2013 3.3  2.3 - 4.2 pg/mL Final   Will d/w pt to recheck labs in 4-6 weeks and if labs same or worse >> proceed with plan above.

## 2013-10-20 ENCOUNTER — Other Ambulatory Visit: Payer: Self-pay | Admitting: Internal Medicine

## 2013-10-20 ENCOUNTER — Other Ambulatory Visit (INDEPENDENT_AMBULATORY_CARE_PROVIDER_SITE_OTHER): Payer: BC Managed Care – PPO

## 2013-10-20 DIAGNOSIS — E042 Nontoxic multinodular goiter: Secondary | ICD-10-CM

## 2013-10-20 DIAGNOSIS — E0520 Thyrotoxicosis with toxic multinodular goiter without thyrotoxic crisis or storm: Secondary | ICD-10-CM

## 2013-10-20 LAB — T3, FREE: T3 FREE: 2.9 pg/mL (ref 2.3–4.2)

## 2013-10-20 LAB — T4, FREE: Free T4: 0.67 ng/dL (ref 0.60–1.60)

## 2013-10-20 LAB — TSH: TSH: 0.19 u[IU]/mL — AB (ref 0.35–4.50)

## 2013-12-19 ENCOUNTER — Other Ambulatory Visit: Payer: Self-pay | Admitting: Endocrinology

## 2013-12-19 DIAGNOSIS — E041 Nontoxic single thyroid nodule: Secondary | ICD-10-CM

## 2013-12-23 ENCOUNTER — Ambulatory Visit
Admission: RE | Admit: 2013-12-23 | Discharge: 2013-12-23 | Disposition: A | Discharge status: Home / Self Care | Payer: BC Managed Care – PPO | Source: Ambulatory Visit | Attending: Endocrinology | Admitting: Endocrinology

## 2013-12-23 ENCOUNTER — Other Ambulatory Visit (HOSPITAL_COMMUNITY)
Admission: RE | Admit: 2013-12-23 | Discharge: 2013-12-23 | Disposition: A | Discharge status: Home / Self Care | Payer: BC Managed Care – PPO | Source: Ambulatory Visit | Attending: Diagnostic Radiology | Admitting: Diagnostic Radiology

## 2013-12-23 DIAGNOSIS — E041 Nontoxic single thyroid nodule: Secondary | ICD-10-CM

## 2013-12-26 ENCOUNTER — Other Ambulatory Visit: Payer: Self-pay | Admitting: Endocrinology

## 2013-12-26 ENCOUNTER — Ambulatory Visit
Admission: RE | Admit: 2013-12-26 | Discharge: 2013-12-26 | Disposition: A | Discharge status: Home / Self Care | Payer: BC Managed Care – PPO | Source: Ambulatory Visit | Attending: Endocrinology | Admitting: Endocrinology

## 2013-12-26 DIAGNOSIS — E041 Nontoxic single thyroid nodule: Secondary | ICD-10-CM

## 2014-01-12 NOTE — Telephone Encounter (Signed)
, °

## 2014-02-02 ENCOUNTER — Encounter: Payer: Self-pay | Admitting: *Deleted

## 2014-03-08 ENCOUNTER — Ambulatory Visit (INDEPENDENT_AMBULATORY_CARE_PROVIDER_SITE_OTHER): Payer: BC Managed Care – PPO

## 2014-03-08 ENCOUNTER — Ambulatory Visit (INDEPENDENT_AMBULATORY_CARE_PROVIDER_SITE_OTHER): Payer: BC Managed Care – PPO | Admitting: Family Medicine

## 2014-03-08 VITALS — BP 108/70 | HR 85 | Temp 98.3°F | Resp 16 | Ht 67.8 in | Wt 141.0 lb

## 2014-03-08 DIAGNOSIS — M7062 Trochanteric bursitis, left hip: Secondary | ICD-10-CM

## 2014-03-08 DIAGNOSIS — M25552 Pain in left hip: Secondary | ICD-10-CM

## 2014-03-08 MED ORDER — DICLOFENAC SODIUM 75 MG PO TBEC: 75.0000 mg | DELAYED_RELEASE_TABLET | Freq: Two times a day (BID) | ORAL | Status: DC

## 2014-03-08 NOTE — Progress Notes (Signed)
Subjective:    Patient ID: Patricia Conley, female    DOB: 1950-06-05, 63 y.o.   MRN: [id number]  HPI Chief Complaint  Patient presents with  . Hip Pain    left side. pain down to knee   This chart was scribed for Robyn Haber, MD by Thea Alken, ED Scribe. This patient was seen in room 9 and the patient's care was started at 8:52 AM.  HPI Comments: Patricia Conley is a 63 y.o. female who presents to the [facility] complaining of sudden left hip pain that began 1 day ago. She reports she was running errands yesterday when she began to have sudden left hip pain with shooting pain to left knee. Pt reports hx of left hip replacement 2 years ago but denies being in pain since surgery up until now. She states she fell into a chair at work but denies the fall being traumatic. She has had dull, aching pain with sleep, throbbing achy pain with bearing weight and sore, tender areas with touch.   Past Medical History  Diagnosis Date  . Menopause   . Osteopenia   . Gall bladder disease     per pt report  . GERD (gastroesophageal reflux disease)   . Hemorrhoids   . Arthritis   . Acetabular labrum tear     right/ per pt history  . Thyroid disease    No Known Allergies Prior to Admission medications   Medication Sig Start Date End Date Taking? Authorizing Provider  Coenzyme Q10 (CO Q-10) 50 MG CAPS Take 50 mg by mouth daily.   Yes Historical Provider, MD  doxycycline (VIBRA-TABS) 100 MG tablet Take 100 mg by mouth daily.   Yes Historical Provider, MD  Magnesium 500 MG CAPS Take 500 mg by mouth daily.   Yes Historical Provider, MD  OVER THE COUNTER MEDICATION Take 5 drops by mouth daily. ESSENTIAL OILS   Yes Historical Provider, MD  Potassium 99 MG TABS Take 99 mg by mouth daily.   Yes Historical Provider, MD  vitamin C (ASCORBIC ACID) 500 MG tablet Take 500 mg by mouth daily.   Yes Historical Provider, MD  valACYclovir (VALTREX) 500 MG tablet Take 500 mg by  mouth 2 (two) times daily.    Historical Provider, MD   Review of Systems  Musculoskeletal: Positive for myalgias and arthralgias.   Objective:   Physical Exam  Constitutional: She is oriented to person, place, and time. She appears well-developed and well-nourished. No distress.  HENT:  Head: Normocephalic and atraumatic.  Eyes: Conjunctivae and EOM are normal.  Neck: Neck supple.  Cardiovascular: Normal rate.   Pulmonary/Chest: Effort normal.  Musculoskeletal: Normal range of motion.  Tender over left trochanter. Left knee swollen with full ROM and obvious endoscopy scars  Neurological: She is alert and oriented to person, place, and time.  Skin: Skin is warm and dry.  Psychiatric: She has a normal mood and affect. Her behavior is normal.  Nursing note and vitals reviewed.  [facility] reading (PRIMARY) by Dr. Joseph Art Hip replacement is in place and is  solid with no evidence of loosening   Assessment & Plan:   1. Left hip pain   I believe this is a hip bursitis based on her history of sudden fall on Friday and gradual spontaneous appearance of pain (burning in character) of the left hip about 24 hours later.  I personally performed the services described in this documentation, which was scribed in my  presence. The recorded information has been reviewed and is accurate.  Left hip pain - Plan: DG Hip Complete Left, diclofenac (VOLTAREN) 75 MG EC tablet  Trochanteric bursitis of left hip - Plan: diclofenac (VOLTAREN) 75 MG EC tablet  Signed, Robyn Haber, MD

## 2014-03-08 NOTE — Patient Instructions (Signed)
Call me back if not better by Wednesday   Hip Bursitis Bursitis is a swelling and soreness (inflammation) of a fluid-filled sac (bursa). This sac overlies and protects the joints.  CAUSES   Injury.  Overuse of the muscles surrounding the joint.  Arthritis.  Gout.  Infection.  Cold weather.  Inadequate warm-up and conditioning prior to activities. The cause may not be known.  SYMPTOMS   Mild to severe irritation.  Tenderness and swelling over the outside of the hip.  Pain with motion of the hip.  If the bursa becomes infected, a fever may be present. Redness, tenderness, and warmth will develop over the hip. Symptoms usually lessen in 3 to 4 weeks with treatment, but can come back. TREATMENT If conservative treatment does not work, your caregiver may advise draining the bursa and injecting cortisone into the area. This may speed up the healing process. This may also be used as an initial treatment of choice. HOME CARE INSTRUCTIONS   Apply ice to the affected area for 15-20 minutes every 3 to 4 hours while awake for the first 2 days. Put the ice in a plastic bag and place a towel between the bag of ice and your skin.  Rest the painful joint as much as possible, but continue to put the joint through a normal range of motion at least 4 times per day. When the pain lessens, begin normal, slow movements and usual activities to help prevent stiffness of the hip.  Only take over-the-counter or prescription medicines for pain, discomfort, or fever as directed by your caregiver.  Use crutches to limit weight bearing on the hip joint, if advised.  Elevate your painful hip to reduce swelling. Use pillows for propping and cushioning your legs and hips.  Gentle massage may provide comfort and decrease swelling. SEEK IMMEDIATE MEDICAL CARE IF:   Your pain increases even during treatment, or you are not improving.  You have a fever.  You have heat and inflammation over the  involved bursa.  You have any other questions or concerns. MAKE SURE YOU:   Understand these instructions.  Will watch your condition.  Will get help right away if you are not doing well or get worse. Document Released: 09/30/2001 Document Revised: 07/03/2011 Document Reviewed: 04/29/2008 [facility] Patient Information 2015 [place], [place]. This information is not intended to replace advice given to you by your health care provider. Make sure you discuss any questions you have with your health care provider.

## 2014-03-24 ENCOUNTER — Other Ambulatory Visit: Payer: Self-pay | Admitting: Endocrinology

## 2014-03-24 DIAGNOSIS — E041 Nontoxic single thyroid nodule: Secondary | ICD-10-CM

## 2014-04-15 ENCOUNTER — Encounter: Payer: Self-pay | Admitting: *Deleted

## 2014-04-23 ENCOUNTER — Other Ambulatory Visit: Payer: Self-pay | Admitting: Dermatology

## 2014-06-23 ENCOUNTER — Encounter: Payer: Self-pay | Admitting: *Deleted

## 2014-07-22 ENCOUNTER — Other Ambulatory Visit (HOSPITAL_COMMUNITY): Payer: Self-pay | Admitting: Endocrinology

## 2014-07-22 DIAGNOSIS — E0590 Thyrotoxicosis, unspecified without thyrotoxic crisis or storm: Secondary | ICD-10-CM

## 2014-07-24 HISTORY — PX: HEMORRHOID BANDING: SHX5850

## 2014-08-03 ENCOUNTER — Encounter (HOSPITAL_COMMUNITY): Payer: BLUE CROSS/BLUE SHIELD

## 2014-08-04 ENCOUNTER — Encounter (HOSPITAL_COMMUNITY): Payer: BLUE CROSS/BLUE SHIELD

## 2014-08-13 ENCOUNTER — Encounter (HOSPITAL_COMMUNITY)
Admission: RE | Admit: 2014-08-13 | Discharge: 2014-08-13 | Disposition: A | Discharge status: Home / Self Care | Payer: BLUE CROSS/BLUE SHIELD | Source: Ambulatory Visit | Attending: Endocrinology | Admitting: Endocrinology

## 2014-08-13 DIAGNOSIS — E0590 Thyrotoxicosis, unspecified without thyrotoxic crisis or storm: Secondary | ICD-10-CM | POA: Diagnosis not present

## 2014-08-13 MED ORDER — SODIUM IODIDE I 131 CAPSULE
9.9800 | Freq: Once | INTRAVENOUS | Status: AC | PRN
  Administered 2014-08-13: 9.98 via ORAL

## 2014-08-14 ENCOUNTER — Encounter (HOSPITAL_COMMUNITY)
Admission: RE | Admit: 2014-08-14 | Discharge: 2014-08-14 | Disposition: A | Discharge status: Home / Self Care | Payer: BLUE CROSS/BLUE SHIELD | Source: Ambulatory Visit | Attending: Endocrinology | Admitting: Endocrinology

## 2014-08-14 MED ORDER — SODIUM PERTECHNETATE TC 99M INJECTION
10.0000 | Freq: Once | INTRAVENOUS | Status: AC | PRN
Start: 2014-08-14 — End: 2014-08-14
  Administered 2014-08-14: 10 via INTRAVENOUS

## 2014-08-17 ENCOUNTER — Other Ambulatory Visit: Payer: Self-pay | Admitting: *Deleted

## 2014-08-17 ENCOUNTER — Encounter: Payer: Self-pay | Admitting: Internal Medicine

## 2014-08-17 DIAGNOSIS — Z0000 Encounter for general adult medical examination without abnormal findings: Secondary | ICD-10-CM

## 2014-08-18 ENCOUNTER — Encounter: Payer: Self-pay | Admitting: Internal Medicine

## 2014-08-18 ENCOUNTER — Encounter: Payer: Self-pay | Admitting: *Deleted

## 2014-08-18 ENCOUNTER — Ambulatory Visit (INDEPENDENT_AMBULATORY_CARE_PROVIDER_SITE_OTHER): Payer: BLUE CROSS/BLUE SHIELD | Admitting: Internal Medicine

## 2014-08-18 VITALS — BP 106/66 | HR 81 | Resp 16 | Ht 66.25 in | Wt 137.0 lb

## 2014-08-18 DIAGNOSIS — E279 Disorder of adrenal gland, unspecified: Secondary | ICD-10-CM

## 2014-08-18 DIAGNOSIS — E042 Nontoxic multinodular goiter: Secondary | ICD-10-CM | POA: Diagnosis not present

## 2014-08-18 DIAGNOSIS — M8580 Other specified disorders of bone density and structure, unspecified site: Secondary | ICD-10-CM | POA: Diagnosis not present

## 2014-08-18 DIAGNOSIS — Z0000 Encounter for general adult medical examination without abnormal findings: Secondary | ICD-10-CM | POA: Diagnosis not present

## 2014-08-18 DIAGNOSIS — E278 Other specified disorders of adrenal gland: Secondary | ICD-10-CM

## 2014-08-18 LAB — POCT URINALYSIS DIPSTICK
Bilirubin, UA: NEGATIVE
Glucose, UA: NEGATIVE
KETONES UA: NEGATIVE
Leukocytes, UA: NEGATIVE
Nitrite, UA: NEGATIVE
PROTEIN UA: NEGATIVE
RBC UA: NEGATIVE
Spec Grav, UA: 1.01
Urobilinogen, UA: NEGATIVE
pH, UA: 6

## 2014-08-18 LAB — CBC WITH DIFFERENTIAL/PLATELET
BASOS PCT: 0 % (ref 0–1)
Basophils Absolute: 0 10*3/uL (ref 0.0–0.1)
Eosinophils Absolute: 0.1 10*3/uL (ref 0.0–0.7)
Eosinophils Relative: 1 % (ref 0–5)
HEMATOCRIT: 39.2 % (ref 36.0–46.0)
Hemoglobin: 13.2 g/dL (ref 12.0–15.0)
LYMPHS ABS: 1.3 10*3/uL (ref 0.7–4.0)
LYMPHS PCT: 24 % (ref 12–46)
MCH: 29.1 pg (ref 26.0–34.0)
MCHC: 33.7 g/dL (ref 30.0–36.0)
MCV: 86.5 fL (ref 78.0–100.0)
MONO ABS: 0.3 10*3/uL (ref 0.1–1.0)
MONOS PCT: 6 % (ref 3–12)
MPV: 10.2 fL (ref 8.6–12.4)
NEUTROS ABS: 3.9 10*3/uL (ref 1.7–7.7)
Neutrophils Relative %: 69 % (ref 43–77)
Platelets: 250 10*3/uL (ref 150–400)
RBC: 4.53 MIL/uL (ref 3.87–5.11)
RDW: 13.8 % (ref 11.5–15.5)
WBC: 5.6 10*3/uL (ref 4.0–10.5)

## 2014-08-18 LAB — LIPID PANEL
CHOL/HDL RATIO: 2.2 ratio
Cholesterol: 161 mg/dL (ref 0–200)
HDL: 74 mg/dL (ref >=46)
LDL CALC: 71 mg/dL (ref 0–99)
Triglycerides: 81 mg/dL (ref <150)
VLDL: 16 mg/dL (ref 0–40)

## 2014-08-18 LAB — COMPLETE METABOLIC PANEL WITH GFR
ALT: 19 U/L (ref 0–35)
AST: 22 U/L (ref 0–37)
Albumin: 4.7 g/dL (ref 3.5–5.2)
Alkaline Phosphatase: 69 U/L (ref 39–117)
BUN: 18 mg/dL (ref 6–23)
CHLORIDE: 101 meq/L (ref 96–112)
CO2: 30 mEq/L (ref 19–32)
CREATININE: 0.7 mg/dL (ref 0.50–1.10)
Calcium: 9.7 mg/dL (ref 8.4–10.5)
Glucose, Bld: 81 mg/dL (ref 70–99)
Potassium: 4.7 mEq/L (ref 3.5–5.3)
SODIUM: 143 meq/L (ref 135–145)
TOTAL PROTEIN: 7.1 g/dL (ref 6.0–8.3)
Total Bilirubin: 0.9 mg/dL (ref 0.2–1.2)

## 2014-08-18 NOTE — Progress Notes (Signed)
Subjective:    Patient ID: Patricia Conley, female    DOB: 12/29/1950, 64 y.o.   MRN: [id number]  HPI  09/19/2013 endocrine note ASSESSMENT: 1. 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 Patient with a recently found low TSH, without thyrotoxic sxs. She had an Uptake and scan that showed a large L toxic adenoma. An U/S showed possible calcifications, but we reviewed the images together and the bright spots appear colloid crystals (had "comet tail), which is a sign of benignity. Pt was reassured.  - I suggested that we check the TSH, fT3 and fT4 today >> decide for management when these are back. - if the TSH is normal, we might be able to just follow the toxic adenoma and check labs and only intervene if her TSH starts to decrease. - If the TSH remains abnormal, depending on the magnitude, we might need to do radioactive iodine ablation or hemithyroidectomy. I explained that the risks with the above procedures can be hypothyroidism. If she starts to have hyperthyroid symptoms, addition of methimazole would be indicated, but I explained that this is only a temporary solution, and not definitive treatment.  She agrees with the above plan and she also prefers a more conservative approach to her condition - I do not feel that we need to add beta blockers at this time, since she is not tachycardic, anxious, or tremulous - RTC in 6 months, but likely sooner for repeat labs   10/2012 CPE note Assessment & Plan:  Health Maintenance Declines Zostavax. See scanned sheet UTD with mm, pap Check labs today  Post menopausal bleeding She has been evaluated by GYN  History of gout last exacerbation 3 years ago. Will refer to Dr. Caffie Pinto  Abnormal Mri/adrenal lesion/liver hemagiomas Will get CT with contrast   Osteopenia calicum and vitamin D  REnal cyst Followed by Dr. Gaynelle Arabian U/A today small blood otherwise negative  DJD  Breast cyst        TODAY  Patricia Conley is  here for CPE   HM Due for mm 4/29  She is a non-smoker   Recent colonoscopy and EGD 05/2014     Review of Systems  Respiratory: Negative for cough, chest tightness, shortness of breath and wheezing.   Cardiovascular: Negative for chest pain, palpitations and leg swelling.       Objective:   Physical Exam  Physical Exam  Vital signs and nursing note reviewed  Constitutional: She is oriented to person, place, and time. She appears well-developed and well-nourished. She is cooperative.  HENT:  Head: Normocephalic and atraumatic.  Right Ear: Tympanic membrane normal.  Left Ear: Tympanic membrane normal.  Nose: Nose normal.  Mouth/Throat: Oropharynx is clear and moist and mucous membranes are normal. No oropharyngeal exudate or posterior oropharyngeal erythema.  Eyes: Conjunctivae and EOM are normal. Pupils are equal, round, and reactive to light.  Neck: Neck supple. No JVD present. Carotid bruit is not present. No mass and no thyromegaly present.  Cardiovascular: Regular rhythm, normal heart sounds, intact distal pulses and normal pulses.  Exam reveals no gallop and no friction rub.   No murmur heard. Pulses:      Dorsalis pedis pulses are 2+ on the right side, and 2+ on the left side.  Pulmonary/Chest: Breath sounds normal. She has no wheezes. She has no rhonchi. She has no rales. Right breast exhibits no mass, no nipple discharge and no skin change. Left breast exhibits no mass, no nipple  discharge and no skin change.  Abdominal: Soft. Bowel sounds are normal. She exhibits no distension and no mass. There is no hepatosplenomegaly. There is no tenderness. There is no CVA tenderness.   Musculoskeletal:       No active synovitis to any joint.    Lymphadenopathy:       Right cervical: No superficial cervical adenopathy present.      Left cervical: No superficial cervical adenopathy present.       Right axillary: No pectoral and no lateral adenopathy present.       Left axillary: No  pectoral and no lateral adenopathy present.      Right: No inguinal adenopathy present.       Left: No inguinal adenopathy present.  Neurological: She is alert and oriented to person, place, and time. She has normal strength and normal reflexes. No cranial nerve deficit or sensory deficit. She displays a negative Romberg sign. Coordination and gait normal.  Skin: Skin is warm and dry. No abrasion, no bruising, no ecchymosis and no rash noted. No cyanosis. Nails show no clubbing.  Psychiatric: She has a normal mood and affect. Her speech is normal and behavior is normal.                   Assessment & Plan   Assessment & Plan:  Health Maintenance Declines Zostavax. And prevnar  See scanned sheet  Advised to schedule 3D mm.    Pap due next year  Check labs today  Hyperfunctioning Thyroid nodule .  I do not have recent thyroid blood tests done by her endocrinologist.  Pt believes there has been improvement.    She has history of independently functioning nodule.  Also had recent RAI uptake done.  She will follow with Dr. Chalmers Cater soon    Osteopenia  DExa 2014 shows osteopenia .  She wishes to defer testing until decision made regarding management of hyperfunctioning nodule .  Will defer testing to endocrine opinion.  If worsening this may help pt decide how to manage her nodule   Abnormal Mri/adrenal lesion/ Pt given CT report of stable  Left  adrenal lesion   Pt advised to discuss with her endocrinologist .  She states she will make appt     REnal cyst Followed by Dr. Gaynelle Arabian U/A today small blood otherwise negative  DJD  Breast cyst   Advised mm I do not palpate a mass today   Recent hemm banding and colon polyp  She has appt this afternoon with Dr. Earlean Shawl

## 2014-08-19 ENCOUNTER — Telehealth: Payer: Self-pay | Admitting: *Deleted

## 2014-08-19 ENCOUNTER — Encounter: Payer: Self-pay | Admitting: *Deleted

## 2014-08-19 LAB — VITAMIN D 25 HYDROXY (VIT D DEFICIENCY, FRACTURES): VIT D 25 HYDROXY: 41 ng/mL (ref 30–100)

## 2014-08-19 NOTE — Telephone Encounter (Signed)
-----   Message from Lanice Shirts, MD sent at 08/19/2014  1:26 PM EDT ----- Ok to mail to pt

## 2014-08-19 NOTE — Telephone Encounter (Signed)
Mailed a copy of labs to patient-eh 

## 2014-08-24 ENCOUNTER — Other Ambulatory Visit: Payer: Self-pay | Admitting: *Deleted

## 2014-08-24 DIAGNOSIS — Z780 Asymptomatic menopausal state: Secondary | ICD-10-CM

## 2014-09-02 ENCOUNTER — Encounter: Payer: Self-pay | Admitting: Internal Medicine

## 2014-09-02 ENCOUNTER — Ambulatory Visit (INDEPENDENT_AMBULATORY_CARE_PROVIDER_SITE_OTHER): Payer: BLUE CROSS/BLUE SHIELD | Admitting: Internal Medicine

## 2014-09-02 VITALS — BP 104/64 | HR 74 | Temp 97.7°F | Resp 12 | Wt 137.0 lb

## 2014-09-02 DIAGNOSIS — E0520 Thyrotoxicosis with toxic multinodular goiter without thyrotoxic crisis or storm: Secondary | ICD-10-CM | POA: Diagnosis not present

## 2014-09-02 NOTE — Progress Notes (Signed)
Patient ID: Patricia Conley, female   DOB: 1950/06/26, 64 y.o.   MRN: [id number]   HPI  Patricia Conley is a 64 y.o.-year-old female, returning for f/u for toxic multinodular goiter, with subclinical hyperthyroidism. Last visit 1 year ago.   Since last visit, she had labs checked by Dr Chalmers Cater.  She has been seen at [facility] - 3 weeks ago. She brings her thyroid levels records.  Reviewed and addended hx: Pt had a low TSH (during investigation for diarrhea) in 07/2013 >> Uptake and scan showed low absorption (10.6%) with a 3 cm hot L thyroid nodule. Additionally, there were some warm thyroid nodules, subcm.  Thyroid Ultrasound showed that the largest nodule was 3 cm. FNA of the L thyroid nodule (12/23/2013) >> benign   Since TSH was only minimally low >> we decided to just follow the TFTs. She did not return in 12/2013 for TFT recheck.  I reviewed pt's thyroid tests: 08/19/2014: TSH 0.562, fT4 1.04, TT3 14.3  07/13/2014: TSH 0.320 (0.34-4.8) 02/2014: TSH 0.5 11/2013: TSH 0.3 (0.32-4.8), fT4 0.71 (0.59-1.17) Lab Results  Component Value Date   TSH 0.19* 10/20/2013   TSH 0.12* 09/19/2013   TSH 0.255* 08/11/2013   TSH 0.623 11/11/2012   TSH 0.528 11/18/2011   TSH 0.515 09/05/2011   TSH 1.275 11/16/2010   FREET4 0.67 10/20/2013   FREET4 0.74 09/19/2013   FREET4 1.03 08/11/2013    Pt denies feeling nodules in neck, hoarseness, dysphagia/odynophagia, SOB with lying down; she c/o: - + hair loss and scalp tingling - + palpitations - she had one episode in which her pulse was in the 100-110 after going for a walk - no excessive sweating/heat intolerance - no weight loss - no tremors - + anxiety - No fatigue - no hyperdefecation  Pt does have a FH of thyroid ds.: sister - hyperthyroidism, 2 sisters - hypothyroidism. No FH of thyroid cancer.  No herbal supplements - stopped Essential oils.  [facility] recommended Me-B12 and also Fish oil  and carnitine.   ROS: Constitutional: see HPI Eyes: no blurry vision, no xerophthalmia ENT: no sore throat, no nodules palpated in throat, no dysphagia/odynophagia, no hoarseness Cardiovascular: no CP/SOB/+ palpitations/no leg swelling Respiratory: no cough/SOB Gastrointestinal: no N/V/resolved D/C Musculoskeletal: No muscle/joint aches Skin: no rashes, + itching and hair loss Neurological: no tremors/numbness/tingling/dizziness, + HA  I reviewed pt's medications, allergies, PMH, social hx, family hx, and changes were documented in the history of present illness. Otherwise, unchanged from my initial visit note.  Past Medical History  Diagnosis Date  . Menopause   . Osteopenia   . Gall bladder disease     per pt report  . GERD (gastroesophageal reflux disease)   . Hemorrhoids   . Arthritis   . Acetabular labrum tear     right/ per pt history  . Thyroid disease    Past Surgical History  Procedure Laterality Date  . Nasal sinus surgery  1995, 2002  . Cystectomy  1999    Right elbow  . Breast surgery  2002    cyst on Left breast  . Total hip arthroplasty  09/12/2011    Procedure: TOTAL HIP ARTHROPLASTY ANTERIOR APPROACH;  Surgeon: Mauri Pole, MD;  Location: [facility];  Service: Orthopedics;  Laterality: Left;  . Joint replacement    . Knee arthroscopy Left 09/10/2013    Procedure: LEFT MEDIAL AND LATERAL KNEE ARTHROSCOPY WITH MENISCAL DEBRIDEMENT ;  Surgeon: Gearlean Alf, MD;  Location:  [facility];  Service: Orthopedics;  Laterality: Left;  . Hemorrhoid banding  07/2014   History   Social History  . Marital Status: Divorced    Spouse Name: N/A    Number of Children: 1   Occupational History  . accountant   Social History Main Topics  . Smoking status: Never Smoker   . Smokeless tobacco: Never Used  . Alcohol Use: 0.5 oz/week    1 drink(s) per week  . Drug Use: No   Current Outpatient Prescriptions on File Prior to Visit  Medication Sig Dispense Refill  . b  complex vitamins capsule Take 1 capsule by mouth daily.    . Cholecalciferol ([employer]S VIT D 5000 HIGH-POTENCY PO) Take 5,000 Units by mouth daily.    . Coenzyme Q10 (CO Q-10) 50 MG CAPS Take 50 mg by mouth daily.    . Magnesium 500 MG CAPS Take 500 mg by mouth daily.    Marland Kitchen OVER THE COUNTER MEDICATION Take 5 drops by mouth daily. ESSENTIAL OILS    . Potassium 99 MG TABS Take 99 mg by mouth daily.    . valACYclovir (VALTREX) 500 MG tablet Take 500 mg by mouth 2 (two) times daily.    . vitamin C (ASCORBIC ACID) 500 MG tablet Take 500 mg by mouth daily.     No current facility-administered medications on file prior to visit.   No Known Allergies Family History  Problem Relation Age of Onset  . Stroke Mother   . Nephrolithiasis Mother   . Diabetes Mother   . Aneurysm Mother     x2  . Cancer Mother     lymphoma, breast cancer  . Depression Father   . Parkinsonism Father   . Drug abuse Father   . Mental illness Sister     paranoia   PE: BP 104/64 mmHg  Pulse 74  Temp(Src) 97.7 F (36.5 C) (Oral)  Resp 12  Wt 137 lb (62.143 kg)  SpO2 96% Body mass index is 21.94 kg/(m^2). Wt Readings from Last 3 Encounters:  09/02/14 137 lb (62.143 kg)  08/18/14 137 lb (62.143 kg)  03/08/14 141 lb (63.957 kg)   Constitutional: normal weight, in NAD Eyes: PERRLA, EOMI ENT: moist mucous membranes, mild thyromegaly L>R, no cervical lymphadenopathy Cardiovascular: RRR, No MRG Respiratory: CTA B Gastrointestinal: abdomen soft, NT, ND, BS+ Musculoskeletal: no deformities, strength intact in all 4 Skin: moist, warm, no rashes Neurological: No tremor with outstretched hands, DTR normal in all 4  ASSESSMENT: 1. 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 Patient with a improving thyroid tests, however with several possible thyrotoxic symptoms: hair loss, palpitations. She had an Uptake and scan that showed a large L toxic adenoma. An U/S showed possible calcifications, but we performed an FNA  of the thyroid nodule and this was benign.  Since the thyroid tests mildly abnormal at last visit and she was not symptomatic, we decided to just follow this. She was lost for follow-up after this and saw Dr. Chalmers Cater for another opinion. She also went to the [facility]. She had repeated thyroid tests that showed either normal or very slightly abnormal TSH, with the last set of tests being within normal range. At this point, we discussed that most likely her symptoms are not related hyperthyroidism (the last set of labs obtained 2 weeks ago being normal). - We discussed that the definitive treatment for her left toxic adenoma is either radioactive iodine treatment or left  thyroidectomy. I advised her that usually after RAI treatment for toxic adenoma, she may not develop hypothyroidism, however, this is still a possibility. She is not ready for any of th2 treatment. I agreed that, with normal tests, the above options may be overly zealous. We did discuss, however, that the tests may start to worsen again, and we may need to opt for definitive treatment in the future. - I would not suggest any treatment at this point in the setting of normal thyroid tests - I suggested that we check the TSH, fT3 and fT4 in 2 months - RTC in 6 months, but sooner for repeat labs

## 2014-09-02 NOTE — Patient Instructions (Signed)
Please come back for labs in 2 months.   Please come back in 6 months.

## 2014-09-16 ENCOUNTER — Encounter: Payer: Self-pay | Admitting: Internal Medicine

## 2014-09-16 ENCOUNTER — Telehealth: Payer: Self-pay | Admitting: Internal Medicine

## 2014-09-16 NOTE — Telephone Encounter (Signed)
Please read message below and advise.  

## 2014-09-16 NOTE — Telephone Encounter (Signed)
Pt called and would like to know if she can get referral to hair loss clinic at [facility]

## 2014-09-17 NOTE — Telephone Encounter (Signed)
Called pt and advised her that Dr Cruzita Lederer will put the referral in for her. Pt pleased.

## 2014-09-17 NOTE — Telephone Encounter (Signed)
This needs to come from PCP.  I did not know there is something like that.Marland KitchenMarland Kitchen

## 2014-09-18 ENCOUNTER — Other Ambulatory Visit: Payer: Self-pay | Admitting: Internal Medicine

## 2014-09-18 DIAGNOSIS — L659 Nonscarring hair loss, unspecified: Secondary | ICD-10-CM

## 2014-09-26 ENCOUNTER — Emergency Department (HOSPITAL_COMMUNITY): Payer: BLUE CROSS/BLUE SHIELD

## 2014-09-26 ENCOUNTER — Encounter (HOSPITAL_COMMUNITY): Payer: Self-pay | Admitting: *Deleted

## 2014-09-26 ENCOUNTER — Emergency Department (HOSPITAL_COMMUNITY)
Admission: EM | Admit: 2014-09-26 | Discharge: 2014-09-26 | Disposition: A | Discharge status: Home / Self Care | Payer: BLUE CROSS/BLUE SHIELD | Attending: Emergency Medicine | Admitting: Emergency Medicine

## 2014-09-26 DIAGNOSIS — Z8639 Personal history of other endocrine, nutritional and metabolic disease: Secondary | ICD-10-CM | POA: Diagnosis not present

## 2014-09-26 DIAGNOSIS — Z79899 Other long term (current) drug therapy: Secondary | ICD-10-CM | POA: Diagnosis not present

## 2014-09-26 DIAGNOSIS — R002 Palpitations: Secondary | ICD-10-CM | POA: Insufficient documentation

## 2014-09-26 DIAGNOSIS — Z8719 Personal history of other diseases of the digestive system: Secondary | ICD-10-CM | POA: Insufficient documentation

## 2014-09-26 DIAGNOSIS — Z8739 Personal history of other diseases of the musculoskeletal system and connective tissue: Secondary | ICD-10-CM | POA: Diagnosis not present

## 2014-09-26 DIAGNOSIS — R0602 Shortness of breath: Secondary | ICD-10-CM | POA: Diagnosis not present

## 2014-09-26 DIAGNOSIS — R42 Dizziness and giddiness: Secondary | ICD-10-CM | POA: Diagnosis not present

## 2014-09-26 DIAGNOSIS — Z8742 Personal history of other diseases of the female genital tract: Secondary | ICD-10-CM | POA: Diagnosis not present

## 2014-09-26 LAB — CBC
HCT: 41.8 % (ref 36.0–46.0)
Hemoglobin: 13.5 g/dL (ref 12.0–15.0)
MCH: 28.9 pg (ref 26.0–34.0)
MCHC: 32.3 g/dL (ref 30.0–36.0)
MCV: 89.5 fL (ref 78.0–100.0)
PLATELETS: 250 10*3/uL (ref 150–400)
RBC: 4.67 MIL/uL (ref 3.87–5.11)
RDW: 12.9 % (ref 11.5–15.5)
WBC: 6.7 10*3/uL (ref 4.0–10.5)

## 2014-09-26 LAB — BASIC METABOLIC PANEL
ANION GAP: 5 (ref 5–15)
BUN: 20 mg/dL (ref 6–20)
CALCIUM: 8.9 mg/dL (ref 8.9–10.3)
CO2: 27 mmol/L (ref 22–32)
Chloride: 105 mmol/L (ref 101–111)
Creatinine, Ser: 0.88 mg/dL (ref 0.44–1.00)
Glucose, Bld: 96 mg/dL (ref 65–99)
Potassium: 3.5 mmol/L (ref 3.5–5.1)
Sodium: 137 mmol/L (ref 135–145)

## 2014-09-26 LAB — I-STAT TROPONIN, ED: TROPONIN I, POC: 0.01 ng/mL (ref 0.00–0.08)

## 2014-09-26 LAB — TSH: TSH: 0.464 u[IU]/mL (ref 0.350–4.500)

## 2014-09-26 NOTE — ED Notes (Signed)
Pt reports heart palpitations or "flutters" on and off x5 weeks, worse in last 4-5 days. Any exertion brings them on. Episodes last for a couple of hours. Started today at 10am, felt like "a bubble" was in her heart and felt like she was going to pass out. Pt reports she does not feel back to baseline right now. Feels like her heart is "enlarged" in her chest.

## 2014-09-26 NOTE — ED Notes (Signed)
She tells me that she has had issues with occasional episodes of palpitations x 5 weeks.  She further tells me that she has been told by her pcp that in recent years her tsh has been "borderline indicating I may have some hyperthyroidism".  She is in no distress; and is on our monitor which currently shows nsr without ectopy.

## 2014-09-26 NOTE — ED Notes (Signed)
Delay in lab draw, pt currently in exray 

## 2014-09-26 NOTE — ED Provider Notes (Signed)
CSN: [id number]     Arrival date & time 09/26/14  1415 History   First MD Initiated Contact with Patient 09/26/14 1508     Chief Complaint  Patient presents with  . Palpitations  . Near Syncope     (Consider location/radiation/quality/duration/timing/severity/associated sxs/prior Treatment) HPI Comments: Patient presents today with complaints of palpitations and near syncope.  She states that she has been having palpitations intermittently for the past 5 weeks.  Palpitations usually last 30-60 seconds and then resolve without intervention.  Palpitations have been occurring more frequently over the past couple of weeks.  She states that after the palpitations resolve she feels a "soreness" in her chest and feels fatigued.  She reports that today she had a brief episode of palpitations that was associated with SOB and followed by an episode of chest pain.  She describes the chest pain as feeling like there is an "air bubble" in the chest.  She reports that all of the symptoms have resolved at this time.  She states that she was seen by Endocrinology for palpitations and hair loss in the past and was found to have a mildly low TSH.  However, she states that no intervention was done at that time.  She denies any prior cardiac history.  Denies any history of PE or DVT.  No prolonged travel or surgery in the past 4 weeks.  No exogenous estrogen use.  She denies nausea, vomiting, abdominal pain, syncope, vision changes, numbness, tingling, focal weakness, difficulty speaking, or difficulty swallowing.  The history is provided by the patient.    Past Medical History  Diagnosis Date  . Menopause   . Osteopenia   . Gall bladder disease     per pt report  . GERD (gastroesophageal reflux disease)   . Hemorrhoids   . Arthritis   . Acetabular labrum tear     right/ per pt history  . Thyroid disease    Past Surgical History  Procedure Laterality Date  . Nasal sinus surgery  1995, 2002  . Cystectomy   1999    Right elbow  . Breast surgery  2002    cyst on Left breast  . Total hip arthroplasty  09/12/2011    Procedure: TOTAL HIP ARTHROPLASTY ANTERIOR APPROACH;  Surgeon: Mauri Pole, MD;  Location: [facility];  Service: Orthopedics;  Laterality: Left;  . Joint replacement    . Knee arthroscopy Left 09/10/2013    Procedure: LEFT MEDIAL AND LATERAL KNEE ARTHROSCOPY WITH MENISCAL DEBRIDEMENT ;  Surgeon: Gearlean Alf, MD;  Location: [facility];  Service: Orthopedics;  Laterality: Left;  . Hemorrhoid banding  07/2014   Family History  Problem Relation Age of Onset  . Stroke Mother   . Nephrolithiasis Mother   . Diabetes Mother   . Aneurysm Mother     x2  . Cancer Mother     lymphoma, breast cancer  . Depression Father   . Parkinsonism Father   . Drug abuse Father   . Mental illness Sister     paranoia   History  Substance Use Topics  . Smoking status: Never Smoker   . Smokeless tobacco: Never Used  . Alcohol Use: 0.5 oz/week    1 Standard drinks or equivalent per week     Comment: once weekly   OB History    No data available     Review of Systems  Constitutional: Negative for fever and chills.  Respiratory: Positive for shortness of breath.   Cardiovascular:  Positive for palpitations.  All other systems reviewed and are negative.     Allergies  Review of patient's allergies indicates no known allergies.  Home Medications   Prior to Admission medications   Medication Sig Start Date End Date Taking? Authorizing Provider  Amino Acids (AMINO ACID PO) Take 1 tablet by mouth 2 (two) times daily.   Yes Historical Provider, MD  b complex vitamins capsule Take 1 capsule by mouth daily.   Yes Historical Provider, MD  Cholecalciferol (CVS VIT D 5000 HIGH-POTENCY PO) Take 5,000 Units by mouth daily.   Yes Historical Provider, MD  Iodine, Kelp, (KELP PO) Take 300 mg by mouth daily.   Yes Historical Provider, MD  Magnesium 500 MG CAPS Take 500 mg by mouth daily.   Yes Historical  Provider, MD  Omega-3 Fatty Acids (OMEGA 3 PO) Take 1 capsule by mouth 2 (two) times daily.   Yes Historical Provider, MD  Potassium 99 MG TABS Take 99 mg by mouth daily.   Yes Historical Provider, MD  Probiotic Product (PROBIOTIC DAILY) CAPS Take 1 capsule by mouth daily.   Yes Historical Provider, MD  SELENIUM PO Take 1 capsule by mouth daily.   Yes Historical Provider, MD  vitamin C (ASCORBIC ACID) 500 MG tablet Take 500 mg by mouth daily.   Yes Historical Provider, MD  zinc gluconate 50 MG tablet Take 50 mg by mouth daily.   Yes Historical Provider, MD   BP 108/72 mmHg  Pulse 66  Resp 14  SpO2 98% Physical Exam  Constitutional: She appears well-developed and well-nourished.  HENT:  Head: Normocephalic and atraumatic.  Mouth/Throat: Oropharynx is clear and moist.  Eyes: EOM are normal. Pupils are equal, round, and reactive to light.  Neck: Normal range of motion. Neck supple.  Cardiovascular: Normal rate, regular rhythm and normal heart sounds.   Pulses:      Dorsalis pedis pulses are 2+ on the right side, and 2+ on the left side.  Pulmonary/Chest: Effort normal and breath sounds normal.  Abdominal: Soft. Bowel sounds are normal. She exhibits no distension and no mass. There is no tenderness. There is no rebound and no guarding.  Musculoskeletal: Normal range of motion.  No LE edema bilaterally  Neurological: She is alert. She has normal strength. No cranial nerve deficit or sensory deficit. Coordination and gait normal.  Skin: Skin is warm and dry.  Psychiatric: She has a normal mood and affect.  Nursing note and vitals reviewed.   ED Course  Procedures (including critical care time) Labs Review Labs Reviewed  CBC  BASIC METABOLIC PANEL  TSH  I-STAT TROPOININ, ED    Imaging Review Dg Chest 2 View  09/26/2014   CLINICAL DATA:  Chest palpitations for 5 weeks worse today, lightheaded, near syncope  EXAM: CHEST  2 VIEW  COMPARISON:  11/18/2011  FINDINGS: Normal heart size,  mediastinal contours, and pulmonary vascularity.  Lungs slightly hyperinflated but clear.  No pulmonary infiltrate, pleural effusion or pneumothorax.  Bones demineralized.  IMPRESSION: No acute abnormalities.   Electronically Signed   By: Lavonia Dana M.D.   On: 09/26/2014 15:20     EKG Interpretation   Date/Time:  Saturday September 26 2014 14:30:49 EDT Ventricular Rate:  75 PR Interval:  141 QRS Duration: 79 QT Interval:  384 QTC Calculation: 429 R Axis:   78 Text Interpretation:  Sinus rhythm Poor R wave progression ECG OTHERWISE  WITHIN NORMAL LIMITS No old tracing to compare Confirmed by Sabra Heck  MD,  BRIAN ([id number]) on 09/26/2014 2:48:34 PM      MDM   Final diagnoses:  None   Patient presents today with a chief complaint of palpitations that have been intermittent over the past 5 weeks.  She reports that the palpitations today were associated with an episode of near syncope.  No focal neurological deficits.  No ischemic changes on EKG.  Troponin negative.  Well's Score is negative for PE.  CXR is negative.  Labs unremarkable.  TSH is WNL.  Feel that the patient is stable for discharge.  Patient given referral to Cardiology and also instructed to follow up with PCP.  Return precautions given.    Hyman Bible, PA-C 09/27/14 [id number]  Elnora Morrison, MD 09/28/14 [phone]

## 2014-09-29 ENCOUNTER — Telehealth: Payer: Self-pay

## 2014-09-29 NOTE — Telephone Encounter (Signed)
SPOKE WITH PT ABOUT HER FAMILY STATUS

## 2014-09-30 ENCOUNTER — Encounter: Payer: Self-pay | Admitting: *Deleted

## 2014-10-01 ENCOUNTER — Encounter: Payer: BLUE CROSS/BLUE SHIELD | Admitting: Internal Medicine

## 2014-10-01 ENCOUNTER — Other Ambulatory Visit: Payer: Self-pay | Admitting: Internal Medicine

## 2014-10-01 ENCOUNTER — Encounter: Payer: Self-pay | Admitting: Internal Medicine

## 2014-10-01 DIAGNOSIS — L659 Nonscarring hair loss, unspecified: Secondary | ICD-10-CM

## 2014-10-02 ENCOUNTER — Other Ambulatory Visit (INDEPENDENT_AMBULATORY_CARE_PROVIDER_SITE_OTHER): Payer: BLUE CROSS/BLUE SHIELD

## 2014-10-02 DIAGNOSIS — E0520 Thyrotoxicosis with toxic multinodular goiter without thyrotoxic crisis or storm: Secondary | ICD-10-CM

## 2014-10-02 DIAGNOSIS — L659 Nonscarring hair loss, unspecified: Secondary | ICD-10-CM

## 2014-10-02 LAB — T4, FREE: Free T4: 0.61 ng/dL (ref 0.60–1.60)

## 2014-10-02 LAB — TSH: TSH: 0.91 u[IU]/mL (ref 0.35–4.50)

## 2014-10-02 LAB — T3, FREE: T3 FREE: 2.9 pg/mL (ref 2.3–4.2)

## 2014-10-05 ENCOUNTER — Other Ambulatory Visit: Payer: BLUE CROSS/BLUE SHIELD

## 2014-10-05 DIAGNOSIS — L659 Nonscarring hair loss, unspecified: Secondary | ICD-10-CM

## 2014-10-05 LAB — TESTOSTERONE,FREE AND TOTAL
TESTOSTERONE: 16 ng/dL (ref 3–41)
Testosterone, Free: 1.6 pg/mL (ref 0.0–4.2)

## 2014-10-06 ENCOUNTER — Encounter: Payer: Self-pay | Admitting: Internal Medicine

## 2014-10-06 LAB — CREATININE, URINE, 24 HOUR
CREATININE 24H UR: 1498 mg/d (ref 700–1800)
Creatinine, Urine: 44.1 mg/dL

## 2014-10-09 ENCOUNTER — Ambulatory Visit (INDEPENDENT_AMBULATORY_CARE_PROVIDER_SITE_OTHER): Payer: BLUE CROSS/BLUE SHIELD | Admitting: Internal Medicine

## 2014-10-09 ENCOUNTER — Encounter: Payer: Self-pay | Admitting: Internal Medicine

## 2014-10-09 VITALS — BP 107/70 | HR 61 | Ht 66.25 in | Wt 136.0 lb

## 2014-10-09 DIAGNOSIS — R002 Palpitations: Secondary | ICD-10-CM | POA: Diagnosis not present

## 2014-10-09 LAB — CORTISOL, URINE, 24 HOUR
Cortisol (Ur), Free: 81.6 mcg/24 h — ABNORMAL HIGH (ref 4.0–50.0)
RESULTS RECEIVED: 1.9 g/(24.h) (ref 0.63–2.50)

## 2014-10-09 NOTE — Progress Notes (Signed)
ELECTROPHYSIOLOGY CONSULT NOTE  Patient ID: Patricia Conley, MRN: [id number], DOB/AGE: 62-Dec-1952 62 y.o. Admit date: (Not on file) Date of Consult: 10/09/2014  Primary Physician: Kelton Pillar, MD Primary Cardiologist: new  Chief Complaint: palpitations   HPI Patricia Conley is a 64 y.o. female  Referred for palpitations which began in the end of April  They were very disruptive and were persistent for about 5 weeks and have now largely abated  They were characterized by bubbly sensation  And she had detected persistnent HR in the 120 range  They were aggraveated by eating and climbing stairs   These typically last hours. They're also a separate palpitations syndrome that would last very momentarily and be associated with presyncope. She ended up in the emergency room on one occasion. ECG at that point was normal.     Past Medical History  Diagnosis Date  . Menopause   . Osteopenia   . Gall bladder disease     per pt report  . GERD (gastroesophageal reflux disease)   . Hemorrhoids   . Arthritis   . Acetabular labrum tear     right/ per pt history  . Thyroid disease       Surgical History:  Past Surgical History  Procedure Laterality Date  . Nasal sinus surgery  1995, 2002  . Cystectomy  1999    Right elbow  . Breast surgery  2002    cyst on Left breast  . Total hip arthroplasty  09/12/2011    Procedure: TOTAL HIP ARTHROPLASTY ANTERIOR APPROACH;  Surgeon: Mauri Pole, MD;  Location: [facility];  Service: Orthopedics;  Laterality: Left;  . Joint replacement    . Knee arthroscopy Left 09/10/2013    Procedure: LEFT MEDIAL AND LATERAL KNEE ARTHROSCOPY WITH MENISCAL DEBRIDEMENT ;  Surgeon: Gearlean Alf, MD;  Location: [facility];  Service: Orthopedics;  Laterality: Left;  . Hemorrhoid banding  07/2014     Home Meds: Prior to Admission medications   Medication Sig Start Date End Date Taking? Authorizing Provider  Amino Acids (AMINO ACID PO) Take 1 tablet by  mouth 2 (two) times daily.   Yes Historical Provider, MD  b complex vitamins capsule Take 1 capsule by mouth daily.   Yes Historical Provider, MD  Biotin 1 MG CAPS Take by mouth.   Yes Historical Provider, MD  Cholecalciferol (CVS VIT D 5000 HIGH-POTENCY PO) Take 5,000 Units by mouth daily.   Yes Historical Provider, MD  doxycycline (PERIOSTAT) 20 MG tablet Take 20 mg by mouth 2 (two) times daily.   Yes Historical Provider, MD  EVENING PRIMROSE OIL PO Take by mouth.   Yes Historical Provider, MD  Iodine, Kelp, (KELP PO) Take 300 mg by mouth daily.   Yes Historical Provider, MD  Magnesium 500 MG CAPS Take 500 mg by mouth daily.   Yes Historical Provider, MD  Omega-3 Fatty Acids (OMEGA 3 PO) Take 1 capsule by mouth 2 (two) times daily.   Yes Historical Provider, MD  Potassium 99 MG TABS Take 99 mg by mouth daily.   Yes Historical Provider, MD  Probiotic Product (PROBIOTIC DAILY) CAPS Take 1 capsule by mouth daily.   Yes Historical Provider, MD  SELENIUM PO Take 1 capsule by mouth daily.   Yes Historical Provider, MD  vitamin C (ASCORBIC ACID) 500 MG tablet Take 500 mg by mouth daily.   Yes Historical Provider, MD  zinc gluconate 50 MG tablet Take 50 mg by mouth daily.  Yes Historical Provider, MD     Allergies: No Known Allergies  History   Social History  . Marital Status: Divorced    Spouse Name: N/A  . Number of Children: N/A  . Years of Education: N/A   Occupational History  . Not on file.   Social History Main Topics  . Smoking status: Never Smoker   . Smokeless tobacco: Never Used  . Alcohol Use: 0.5 oz/week    1 Standard drinks or equivalent per week     Comment: once weekly  . Drug Use: No  . Sexual Activity: Not Currently   Other Topics Concern  . Not on file   Social History Narrative     Family History  Problem Relation Age of Onset  . Stroke Mother   . Nephrolithiasis Mother   . Diabetes Mother   . Aneurysm Mother     x2  . Cancer Mother     lymphoma,  breast cancer  . Depression Father   . Parkinsonism Father   . Drug abuse Father   . Mental illness Sister     paranoia     ROS:  Please see the history of present illness.     All other systems reviewed and negative.    Physical Exam   Blood pressure 107/70, pulse 61, height 5' 6.25" (1.683 m), weight 136 lb (61.689 kg). General: Well developed, well nourished female in no acute distress. Head: Normocephalic, atraumatic, sclera non-icteric, no xanthomas, nares are without discharge. EENT: normal Lymph Nodes:  none Back: without scoliosis/kyphosi no CVA tendersness Neck: Negative for carotid bruits. JVD not elevated. Lungs: Clear bilaterally to auscultation without wheezes, rales, or rhonchi. Breathing is unlabored. Heart: RRR with S1 S2. N murmur , rubs, or gallops appreciated. Abdomen: Soft, non-tender, non-distended with normoactive bowel sounds. No hepatomegaly. No rebound/guarding. No obvious abdominal masses. Msk:  Strength and tone appear normal for age. Extremities: No clubbing or cyanosis. N edema.  Distal pedal pulses are 2+ and equal bilaterally. Skin: Warm and Dry Neuro: Alert and oriented X 3. CN III-XII intact Grossly normal sensory and motor function . Psych:  Responds to questions appropriately with a normal affect.      Labs: Cardiac Enzymes No results for input(s): CKTOTAL, CKMB, TROPONINI in the last 72 hours. CBC Lab Results  Component Value Date   WBC 6.7 09/26/2014   HGB 13.5 09/26/2014   HCT 41.8 09/26/2014   MCV 89.5 09/26/2014   PLT 250 09/26/2014   PROTIME: No results for input(s): LABPROT, INR in the last 72 hours. Chemistry No results for input(s): NA, K, CL, CO2, BUN, CREATININE, CALCIUM, PROT, BILITOT, ALKPHOS, ALT, AST, GLUCOSE in the last 168 hours.  Invalid input(s): LABALBU Lipids Lab Results  Component Value Date   CHOL 161 08/17/2014   HDL 74 08/17/2014   LDLCALC 71 08/17/2014   TRIG 81 08/17/2014   BNP No results found for:  PROBNP Thyroid Function Tests: No results for input(s): TSH, T4TOTAL, T3FREE, THYROIDAB in the last 72 hours.  Invalid input(s): FREET3    Miscellaneous Lab Results  Component Value Date   DDIMER 0.35 11/18/2011    Radiology/Studies:  Dg Chest 2 View  09/26/2014   CLINICAL DATA:  Chest palpitations for 5 weeks worse today, lightheaded, near syncope  EXAM: CHEST  2 VIEW  COMPARISON:  11/18/2011  FINDINGS: Normal heart size, mediastinal contours, and pulmonary vascularity.  Lungs slightly hyperinflated but clear.  No pulmonary infiltrate, pleural effusion or pneumothorax.  Bones  demineralized.  IMPRESSION: No acute abnormalities.   Electronically Signed   By: Lavonia Dana M.D.   On: 09/26/2014 15:20    EKG: Sinus rhythm at 61 intervals 14/08/42  Assessment and Plan:   Palpitations.  Alopecia  Her palpitations syndrome has resolved. It was quite discombobulating. It sounds like it may have been a tachycardia with atrial fibrillation. There could've been some ventricular ectopy is a cause of the "bubbly sensation". However, given the fact that his abated I don't think pursuing at this point makes sense. I've discussed with her the use of an [employer] monitor. She can get it in the event that they symptoms recur  I don't have a unifying explanation for the alopecia and her palpitations. There was the issue of hyperthyroidism that that has been resolved  Will get an echo to make sure no structural echo     Virl Axe

## 2014-10-09 NOTE — Patient Instructions (Addendum)
Medication Instructions:  Your physician recommends that you continue on your current medications as directed. Please refer to the Current Medication list given to you today.   Labwork: NONE  Testing/Procedures: Your physician has requested that you have an echocardiogram. Echocardiography is a painless test that uses sound waves to create images of your heart. It provides your doctor with information about the size and shape of your heart and how well your heart's chambers and valves are working. This procedure takes approximately one hour. There are no restrictions for this procedure.   Follow-Up: Follow up with Dr. Caryl Comes as needed.   Dr. Caryl Comes recommends that you get an [email or web address]  Any Other Special Instructions Will Be Listed Below (If Applicable).

## 2014-10-15 ENCOUNTER — Encounter: Payer: Self-pay | Admitting: Internal Medicine

## 2014-10-22 ENCOUNTER — Ambulatory Visit (HOSPITAL_COMMUNITY): Payer: BLUE CROSS/BLUE SHIELD | Attending: Cardiology

## 2014-10-22 DIAGNOSIS — I358 Other nonrheumatic aortic valve disorders: Secondary | ICD-10-CM | POA: Insufficient documentation

## 2014-10-22 DIAGNOSIS — R002 Palpitations: Secondary | ICD-10-CM | POA: Diagnosis not present

## 2014-10-22 DIAGNOSIS — I071 Rheumatic tricuspid insufficiency: Secondary | ICD-10-CM | POA: Insufficient documentation

## 2014-10-27 ENCOUNTER — Other Ambulatory Visit: Payer: Self-pay | Admitting: Internal Medicine

## 2014-10-27 DIAGNOSIS — R82998 Other abnormal findings in urine: Secondary | ICD-10-CM

## 2014-10-27 MED ORDER — DEXAMETHASONE 1 MG PO TABS: ORAL_TABLET | ORAL | Status: DC

## 2014-10-28 ENCOUNTER — Other Ambulatory Visit (INDEPENDENT_AMBULATORY_CARE_PROVIDER_SITE_OTHER): Payer: BLUE CROSS/BLUE SHIELD

## 2014-10-28 DIAGNOSIS — R82998 Other abnormal findings in urine: Secondary | ICD-10-CM

## 2014-10-28 DIAGNOSIS — R8299 Other abnormal findings in urine: Secondary | ICD-10-CM | POA: Diagnosis not present

## 2014-10-28 DIAGNOSIS — E0520 Thyrotoxicosis with toxic multinodular goiter without thyrotoxic crisis or storm: Secondary | ICD-10-CM

## 2014-10-28 LAB — T4, FREE: Free T4: 0.64 ng/dL (ref 0.60–1.60)

## 2014-10-28 LAB — TSH: TSH: 0.51 u[IU]/mL (ref 0.35–4.50)

## 2014-10-28 LAB — CORTISOL: Cortisol, Plasma: 1.1 ug/dL

## 2014-10-28 LAB — T3, FREE: T3, Free: 3.3 pg/mL (ref 2.3–4.2)

## 2014-10-29 ENCOUNTER — Other Ambulatory Visit: Payer: Self-pay | Admitting: Internal Medicine

## 2014-10-29 DIAGNOSIS — R82998 Other abnormal findings in urine: Secondary | ICD-10-CM

## 2014-11-04 LAB — DEXAMETHASONE, BLOOD: DEXAMETHASONE, SERUM: 475 ng/dL

## 2014-11-11 ENCOUNTER — Encounter: Payer: Self-pay | Admitting: Internal Medicine

## 2014-12-04 ENCOUNTER — Other Ambulatory Visit: Payer: Self-pay | Admitting: Internal Medicine

## 2014-12-04 ENCOUNTER — Encounter: Payer: Self-pay | Admitting: Internal Medicine

## 2014-12-04 DIAGNOSIS — L659 Nonscarring hair loss, unspecified: Secondary | ICD-10-CM

## 2014-12-04 DIAGNOSIS — E278 Other specified disorders of adrenal gland: Secondary | ICD-10-CM

## 2014-12-04 DIAGNOSIS — E279 Disorder of adrenal gland, unspecified: Principal | ICD-10-CM

## 2014-12-08 ENCOUNTER — Other Ambulatory Visit (INDEPENDENT_AMBULATORY_CARE_PROVIDER_SITE_OTHER): Payer: BLUE CROSS/BLUE SHIELD

## 2014-12-08 ENCOUNTER — Ambulatory Visit: Payer: Self-pay | Admitting: Podiatry

## 2014-12-08 DIAGNOSIS — E279 Disorder of adrenal gland, unspecified: Secondary | ICD-10-CM

## 2014-12-08 DIAGNOSIS — L659 Nonscarring hair loss, unspecified: Secondary | ICD-10-CM

## 2014-12-08 DIAGNOSIS — E278 Other specified disorders of adrenal gland: Secondary | ICD-10-CM

## 2014-12-08 LAB — CORTISOL: Cortisol, Plasma: 7.5 ug/dL

## 2014-12-08 LAB — VITAMIN B12: Vitamin B-12: >1500 pg/mL — ABNORMAL HIGH (ref 211–911)

## 2014-12-08 LAB — FOLLICLE STIMULATING HORMONE: FSH: 88.4 m[IU]/mL

## 2014-12-08 LAB — FERRITIN: FERRITIN: 83.2 ng/mL (ref 10.0–291.0)

## 2014-12-08 LAB — LUTEINIZING HORMONE: LH: 37.38 m[IU]/mL

## 2014-12-08 LAB — POTASSIUM: Potassium: 4 mEq/L (ref 3.5–5.1)

## 2014-12-09 ENCOUNTER — Encounter: Payer: Self-pay | Admitting: Podiatry

## 2014-12-09 ENCOUNTER — Ambulatory Visit (INDEPENDENT_AMBULATORY_CARE_PROVIDER_SITE_OTHER): Payer: BLUE CROSS/BLUE SHIELD | Admitting: Podiatry

## 2014-12-09 VITALS — BP 110/67 | HR 68 | Ht 66.25 in | Wt 136.0 lb

## 2014-12-09 DIAGNOSIS — M25579 Pain in unspecified ankle and joints of unspecified foot: Secondary | ICD-10-CM

## 2014-12-09 DIAGNOSIS — M2010 Hallux valgus (acquired), unspecified foot: Secondary | ICD-10-CM | POA: Diagnosis not present

## 2014-12-09 DIAGNOSIS — M79673 Pain in unspecified foot: Secondary | ICD-10-CM

## 2014-12-09 LAB — PROLACTIN: PROLACTIN: 3.8 ng/mL

## 2014-12-09 NOTE — Patient Instructions (Signed)
Seen for painful feet. Noted of weakened first metatarsal bone. Metatarsal binder dispensed x 2 (med). Need to wear lace up tennis shoes.

## 2014-12-09 NOTE — Progress Notes (Signed)
Subjective: 64 year old female presents complaining of pain on both feet. Ball of right foot feels swollen. When barefooted, the area gets knots, swollen, black and blue before it gets down after resting. This has happened before, 10 years ago. This time it happened 2 weeks ago and subsided. Still have some swelling and pain on the side of ball.   Objective: Neurovascular status are within normal.  Severe hypermobile first ray with bunion deformity bilateral. No open skin lesion or abnormal edema or erythema noted.  Assessment: Metatarsalgia bilateral. Hypermobile first ray with bunion bilateral.  Plan: Reviewed findings. Advised proper shoe gear.  Metatarsal binder dispensed x 2.

## 2014-12-10 LAB — METHYLMALONIC ACID, SERUM: METHYLMALONIC ACID, QUANT: 120 nmol/L (ref 87–318)

## 2014-12-11 LAB — METANEPHRINES, PLASMA
METANEPHRINE FREE: 38 pg/mL (ref <=57)
Normetanephrine, Free: 107 pg/mL (ref <=148)
TOTAL METANEPHRINES-PLASMA: 145 pg/mL (ref <=205)

## 2014-12-11 LAB — ACTH: C206 ACTH: 15 pg/mL (ref 6–50)

## 2014-12-12 LAB — CATECHOLAMINES, FRACTIONATED, PLASMA
Catecholamines, Total: 627 pg/mL
Epinephrine: 45 pg/mL
Norepinephrine: 582 pg/mL

## 2014-12-12 LAB — INSULIN-LIKE GROWTH FACTOR
IGF-I, LC/MS: 77 ng/mL (ref 41–279)
Z-Score (Female): -1 SD (ref ?–2.0)

## 2014-12-14 ENCOUNTER — Other Ambulatory Visit: Payer: BLUE CROSS/BLUE SHIELD

## 2014-12-14 DIAGNOSIS — R82998 Other abnormal findings in urine: Secondary | ICD-10-CM

## 2014-12-15 LAB — CREATININE, URINE, 24 HOUR
Creatinine, 24H Ur: 1308 mg/d (ref 700–1800)
Creatinine, Urine: 43.6 mg/dL

## 2014-12-16 ENCOUNTER — Encounter: Payer: Self-pay | Admitting: Internal Medicine

## 2014-12-17 ENCOUNTER — Encounter: Payer: Self-pay | Admitting: Internal Medicine

## 2014-12-17 LAB — ALDOSTERONE + RENIN ACTIVITY W/ RATIO
ALDO / PRA Ratio: 2.9 Ratio (ref 0.9–28.9)
Aldosterone: 3 ng/dL
PRA LC/MS/MS: 1.03 ng/mL/h (ref 0.25–5.82)

## 2014-12-18 LAB — CORTISOL, URINE, 24 HOUR
CORTISOL (UR), FREE: 44 ug/(24.h) (ref 4.0–50.0)
RESULTS RECEIVED: 1.34 g/(24.h) (ref 0.63–2.50)

## 2014-12-30 ENCOUNTER — Encounter: Payer: Self-pay | Admitting: Internal Medicine

## 2015-01-06 DIAGNOSIS — E0590 Thyrotoxicosis, unspecified without thyrotoxic crisis or storm: Secondary | ICD-10-CM | POA: Insufficient documentation

## 2015-02-18 ENCOUNTER — Ambulatory Visit: Payer: BLUE CROSS/BLUE SHIELD | Admitting: Internal Medicine

## 2015-03-08 ENCOUNTER — Other Ambulatory Visit: Payer: BC Managed Care – PPO

## 2015-03-25 ENCOUNTER — Encounter: Payer: Self-pay | Admitting: Internal Medicine

## 2015-03-25 ENCOUNTER — Other Ambulatory Visit (INDEPENDENT_AMBULATORY_CARE_PROVIDER_SITE_OTHER): Payer: BLUE CROSS/BLUE SHIELD

## 2015-03-25 ENCOUNTER — Ambulatory Visit (INDEPENDENT_AMBULATORY_CARE_PROVIDER_SITE_OTHER): Payer: BLUE CROSS/BLUE SHIELD | Admitting: Internal Medicine

## 2015-03-25 VITALS — BP 120/82 | HR 81 | Temp 97.8°F | Resp 20 | Ht 66.0 in | Wt 138.6 lb

## 2015-03-25 DIAGNOSIS — L659 Nonscarring hair loss, unspecified: Secondary | ICD-10-CM

## 2015-03-25 DIAGNOSIS — E0520 Thyrotoxicosis with toxic multinodular goiter without thyrotoxic crisis or storm: Secondary | ICD-10-CM

## 2015-03-25 DIAGNOSIS — R7989 Other specified abnormal findings of blood chemistry: Secondary | ICD-10-CM

## 2015-03-25 DIAGNOSIS — M810 Age-related osteoporosis without current pathological fracture: Secondary | ICD-10-CM

## 2015-03-25 DIAGNOSIS — H04129 Dry eye syndrome of unspecified lacrimal gland: Secondary | ICD-10-CM | POA: Diagnosis not present

## 2015-03-25 LAB — CBC WITH DIFFERENTIAL/PLATELET
BASOS ABS: 0 10*3/uL (ref 0.0–0.1)
BASOS PCT: 0.5 % (ref 0.0–3.0)
EOS ABS: 0.2 10*3/uL (ref 0.0–0.7)
Eosinophils Relative: 3.1 % (ref 0.0–5.0)
HEMATOCRIT: 42.3 % (ref 36.0–46.0)
HEMOGLOBIN: 13.6 g/dL (ref 12.0–15.0)
LYMPHS PCT: 28 % (ref 12.0–46.0)
Lymphs Abs: 1.8 10*3/uL (ref 0.7–4.0)
MCHC: 32.2 g/dL (ref 30.0–36.0)
MCV: 88.7 fl (ref 78.0–100.0)
MONO ABS: 0.4 10*3/uL (ref 0.1–1.0)
Monocytes Relative: 6.8 % (ref 3.0–12.0)
NEUTROS ABS: 4.1 10*3/uL (ref 1.4–7.7)
Neutrophils Relative %: 61.6 % (ref 43.0–77.0)
PLATELETS: 244 10*3/uL (ref 150.0–400.0)
RBC: 4.77 Mil/uL (ref 3.87–5.11)
RDW: 13.6 % (ref 11.5–15.5)
WBC: 6.6 10*3/uL (ref 4.0–10.5)

## 2015-03-25 LAB — COMPREHENSIVE METABOLIC PANEL
ALBUMIN: 4.6 g/dL (ref 3.5–5.2)
ALT: 17 U/L (ref 0–35)
AST: 20 U/L (ref 0–37)
Alkaline Phosphatase: 53 U/L (ref 39–117)
BILIRUBIN TOTAL: 0.6 mg/dL (ref 0.2–1.2)
BUN: 18 mg/dL (ref 6–23)
CALCIUM: 9.6 mg/dL (ref 8.4–10.5)
CO2: 31 mEq/L (ref 19–32)
CREATININE: 0.74 mg/dL (ref 0.40–1.20)
Chloride: 103 mEq/L (ref 96–112)
GFR: 84.5 mL/min (ref >60.00)
Glucose, Bld: 91 mg/dL (ref 70–99)
Potassium: 4.2 mEq/L (ref 3.5–5.1)
SODIUM: 141 meq/L (ref 135–145)
Total Protein: 7.4 g/dL (ref 6.0–8.3)

## 2015-03-25 LAB — C-REACTIVE PROTEIN: CRP: 0.2 mg/dL — ABNORMAL LOW (ref 0.5–20.0)

## 2015-03-25 LAB — SEDIMENTATION RATE: SED RATE: 4 mm/h (ref 0–22)

## 2015-03-25 NOTE — Assessment & Plan Note (Signed)
Following with endocrine, Dr. Lorna Few

## 2015-03-25 NOTE — Patient Instructions (Addendum)
  We have reviewed your prior records including labs and tests today.  Test(s) ordered today. Your results will be released to MyChart (or called to you) after review, usually within 72hours after test completion. If any changes need to be made, you will be notified at that same time.  All other Health Maintenance issues reviewed.   All recommended immunizations and age-appropriate screenings are up-to-date.  No immunizations administered today.   Medications reviewed and updated.  No changes recommended at this time.  Your prescription(s) have been submitted to your pharmacy. Please take as directed and contact our office if you believe you are having problem(s) with the medication(s).        

## 2015-03-25 NOTE — Assessment & Plan Note (Addendum)
Fem neck  -2.6 in 2016 - done through gyn Decrease in density may be related to overactive thyroid + age Does not want to go on medication Taking calcium, vitamin D and vitamin K Exercising, but stressed importance of increasing exercise

## 2015-03-25 NOTE — Progress Notes (Signed)
Subjective:    Patient ID: Patricia Conley, female    DOB: 01/30/1951, 64 y.o.   MRN: [id number]  HPI She is here to establish with a new pcp.   Thyroid disease:  Her tsh was low about one and a half years ago.  She started seeing an endocrinologist.  She had all the testing done.  Her antibodies were negative.  She had a biopsy of a hot nodule.  Her thyroid returned to normal.  In January she started to lose her hair, had palpitations, dry skin/nails, agitated sleep.  She went on supplements and it seemed to help.  Her symptoms got worse again.  Her hair loss has been persistent.  Her thyroid function has varied and goes from overactive to normal.  Her symptoms are intense when her tsh is low.  She was just started on methimazole a few days ago.  She is unsure which of her symptoms are related to her thyroid or something else.  She also has a strong family history of autoimmune diseases and thinks she may have an underlying autoimmune disease..    Osteopenia/osteoporosis:  She has had mild osteopenia for about 15 years and her recent dexa showed that she now has osteoporosis.  She is very concerned. She was on fosamax for four years in the past and has no desire to go back on medication.  She is taking calcium and vitamin D.  She has recently added vitamin K.  She is exercising, but not as much as she should.  Sometimes when she eats her food it feels like it gets stuck in her LUQ and is uncomfortable.  This is not consistent and not related to anything in particular.  Her bowel movements are very erratic, which may or may not be from the thyroid.  She is following with GI.  He thought she may have IBS.    Once a month or once every couple of months she has flu like symptoms and gets bruising throughout her body. She also has swollen joints and brain fog.  It last a few days.  This has been happening since the mid 1990's.    Medications and allergies reviewed with patient and updated if  appropriate.  Patient Active Problem List   Diagnosis Date Noted  . Elevated urinary free cortisol level 10/29/2014  . Hair loss 09/18/2014  . 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 Acute medial meniscal tear 09/10/2013  . Abnormal TSH 08/25/2013  . Pain in joint, ankle and foot 12/18/2012  . Epistaxis 11/20/2012  . Bunion of great toe 11/15/2012  . Arthralgia of ankle or foot 11/15/2012  . Sinus bradycardia by electrocardiogram 11/11/2012  . DJD (degenerative joint disease) 11/11/2012  . History of gout 11/11/2012  . GERD (gastroesophageal reflux disease) 10/27/2012  . Vaginal bleeding 04/30/2012  . S/P left THA, AA 09/12/2011  . Hemorrhoids, external without complications [id number]  . Renal cyst 12/15/2010  . Adrenal nodule ([place]) 12/15/2010  . Liver hemangioma 12/15/2010  . Benign breast cyst in female   . Menopause   . Osteopenia   . Gall bladder disease     Current Outpatient Prescriptions on File Prior to Visit  Medication Sig Dispense Refill  . Amino Acids (AMINO ACID PO) Take 1 tablet by mouth 2 (two) times daily.    Marland Kitchen b complex vitamins capsule Take 1 capsule by mouth daily.    . Biotin 1 MG CAPS Take by mouth.    . Cholecalciferol (CVS  VIT D 5000 HIGH-POTENCY PO) Take 5,000 Units by mouth daily.    Marland Kitchen EVENING PRIMROSE OIL PO Take by mouth.    . Magnesium 500 MG CAPS Take 500 mg by mouth daily.    . Omega-3 Fatty Acids (OMEGA 3 PO) Take 1 capsule by mouth 2 (two) times daily.    . Potassium 99 MG TABS Take 99 mg by mouth daily.    . Probiotic Product (PROBIOTIC DAILY) CAPS Take 1 capsule by mouth daily.    . SELENIUM PO Take 1 capsule by mouth daily.    . vitamin C (ASCORBIC ACID) 500 MG tablet Take 500 mg by mouth daily.    Marland Kitchen zinc gluconate 50 MG tablet Take 50 mg by mouth daily.     No current facility-administered medications on file prior to visit.    Past Medical History  Diagnosis Date  . Menopause   . Osteoporosis   . Gall bladder disease     per  pt report  . GERD (gastroesophageal reflux disease)   . Hemorrhoids   . Arthritis   . Acetabular labrum tear     right/ per pt history  . Thyroid disease     Past Surgical History  Procedure Laterality Date  . Nasal sinus surgery  1995, 2002  . Cystectomy  1999    Right elbow  . Breast surgery  2002    cyst on Left breast  . Total hip arthroplasty  09/12/2011    Procedure: TOTAL HIP ARTHROPLASTY ANTERIOR APPROACH;  Surgeon: Mauri Pole, MD;  Location: [facility];  Service: Orthopedics;  Laterality: Left;  . Joint replacement    . Knee arthroscopy Left 09/10/2013    Procedure: LEFT MEDIAL AND LATERAL KNEE ARTHROSCOPY WITH MENISCAL DEBRIDEMENT ;  Surgeon: Gearlean Alf, MD;  Location: [facility];  Service: Orthopedics;  Laterality: Left;  . Hemorrhoid banding  07/2014    Social History   Social History  . Marital Status: Divorced    Spouse Name: N/A  . Number of Children: N/A  . Years of Education: N/A   Social History Main Topics  . Smoking status: Never Smoker   . Smokeless tobacco: Never Used  . Alcohol Use: 0.6 oz/week    1 Standard drinks or equivalent per week     Comment: once weekly  . Drug Use: No  . Sexual Activity: Not Currently   Other Topics Concern  . None   Social History Narrative    Review of Systems  Constitutional: Positive for diaphoresis (under arms). Negative for fever, chills and fatigue.  Respiratory: Negative for cough, shortness of breath and wheezing.   Cardiovascular: Positive for palpitations. Negative for chest pain and leg swelling.  Gastrointestinal: Positive for abdominal pain (LUQ after eating - sometimes - last one hour or so). Negative for nausea.       No gerd.  BM vary - typically looser stools  Endocrine: Positive for heat intolerance.  Genitourinary: Negative for dysuria and hematuria.  Musculoskeletal: Negative for joint swelling and arthralgias.  Skin: Positive for rash (eczema on left foot).       Hair loss on head, hair loss  on legs  Neurological: Positive for headaches. Negative for dizziness, weakness, light-headedness and numbness.       Objective:   Filed Vitals:   03/25/15 1110  BP: 120/82  Pulse: 81  Temp: 97.8 F (36.6 C)  Resp: 20   Filed Weights   03/25/15 1110  Weight: 138 lb 9  oz (62.852 kg)   Body mass index is 22.38 kg/(m^2).   Physical Exam  Constitutional: She is oriented to person, place, and time. She appears well-developed and well-nourished. No distress.  HENT:  Head: Normocephalic and atraumatic.  Mouth/Throat: Oropharynx is clear and moist.  Eyes: Conjunctivae are normal.  Neck: Neck supple. No tracheal deviation present. No thyromegaly present.  Cardiovascular: Normal rate, regular rhythm and normal heart sounds.   No murmur heard. Pulmonary/Chest: Effort normal and breath sounds normal. No respiratory distress. She has no wheezes. She has no rales.  Abdominal: Soft. She exhibits no distension. There is no tenderness.  Musculoskeletal: She exhibits no edema.  Lymphadenopathy:    She has no cervical adenopathy.  Neurological: She is alert and oriented to person, place, and time.  Skin: Skin is warm and dry. She is not diaphoretic.  Psychiatric: She has a normal mood and affect. Her behavior is normal.          Assessment & Plan:   She has several symptoms that are likely related to her thyroid disease.  Her symptoms are intense but have been intermittent and her thyroid function has been variable, which is likely related to hot nodules.  She very well may also have an underlying autoimmune disease given some of her symptoms that have predated her thyroid disease/symptoms.  Tests in the past have been negative.  She wonders about Sjogrens and was told in the past she likely had it.  She understands until her thyroid is well controlled it will be difficult to sort everything else out.   We will test for Sjogrens and other autoimmune diseases now - consider rheum  referral in future depending on symptoms after her thyroid is controlled  See problem list for further assessment and plan    60 minutes were spent face-to-face with the patient, over 50% of which was spent counseling regarding her thyroid disease and symptoms.  We discussed treatment options and further evaluation that might be needed for some of her symptoms if it is found they are not related to her thyroid disease

## 2015-03-25 NOTE — Assessment & Plan Note (Signed)
?   Related to thyroid She has seen derm but biopsy did not reveal cause This is causing a lot of stress She has an appt for derm who specializes in hair loss, but not until mid 2017

## 2015-03-25 NOTE — Assessment & Plan Note (Signed)
Will check SSA/SSB

## 2015-03-25 NOTE — Progress Notes (Signed)
Pre visit review using our clinic review tool, if applicable. No additional management support is needed unless otherwise documented below in the visit note. 

## 2015-03-25 NOTE — Assessment & Plan Note (Signed)
Over active, not graves - hot nodules Following with endocrine Recently put on methimazole Very symptomatic

## 2015-03-26 LAB — ANA: ANA: NEGATIVE

## 2015-03-28 ENCOUNTER — Encounter: Payer: Self-pay | Admitting: Internal Medicine

## 2015-04-12 ENCOUNTER — Ambulatory Visit: Payer: BLUE CROSS/BLUE SHIELD | Admitting: Internal Medicine

## 2015-04-21 ENCOUNTER — Ambulatory Visit (INDEPENDENT_AMBULATORY_CARE_PROVIDER_SITE_OTHER): Payer: BLUE CROSS/BLUE SHIELD | Admitting: Internal Medicine

## 2015-04-21 ENCOUNTER — Encounter: Payer: Self-pay | Admitting: Internal Medicine

## 2015-04-21 VITALS — BP 106/76 | HR 73 | Temp 97.8°F | Resp 16 | Wt 141.0 lb

## 2015-04-21 DIAGNOSIS — L98491 Non-pressure chronic ulcer of skin of other sites limited to breakdown of skin: Secondary | ICD-10-CM

## 2015-04-21 DIAGNOSIS — E0520 Thyrotoxicosis with toxic multinodular goiter without thyrotoxic crisis or storm: Secondary | ICD-10-CM | POA: Diagnosis not present

## 2015-04-21 NOTE — Progress Notes (Signed)
Subjective:    Patient ID: Patricia Conley, female    DOB: 16-Jan-1951, 64 y.o.   MRN: [id number]  HPI She has a yeast infection and has seen gyn and is on medication.   She has an appt to see Dr Fredderick Phenix on 1/6.  She reacts to histamines. She wonders if she has a mast cell disorder.   Thyroid:  Her palpitations are better.  Her hair loss is better.  Both are still there.  She is supposed to have her thyroid rechecked.      Chronic LUQ pain:   Intermittent for one year. She is following with GI. Workup has been negative. She recently discussed with him whether or not she may have a mass felt disorder and he started her on Pepcid twice daily. She thinks that may have helped.  The LUQ pain got better, but has come back mildly.  Today she woke up with bad pain.   She has flushes - usually from her chest to her head and her ears get really red.  Her eyes start burning and it can last for hours.   The LUQ pain got better, but has come back mildly.  Today she woke up with bad pain.    When she took a shower Friday night.  Her left leg was bleeding when she got out.  There was a raised area on the left anterior leg and it burst.  She had two similar episodes in the past.  She denies any trauma.  When she was in the shower that area did itch a lot and then that went away.    She can not eat anything with histamines.    The pepcid has decreased the scalp itch and red ears.    Medications and allergies reviewed with patient and updated if appropriate.  Patient Active Problem List   Diagnosis Date Noted  . Osteoporosis 03/25/2015  . Dry eye 03/25/2015  . Elevated urinary free cortisol level 10/29/2014  . Hair loss 09/18/2014  . 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 Acute medial meniscal tear 09/10/2013  . Abnormal TSH 08/25/2013  . Bunion of great toe 11/15/2012  . Arthralgia of ankle or foot 11/15/2012  . DJD (degenerative joint disease) 11/11/2012  . History of gout 11/11/2012   . GERD (gastroesophageal reflux disease) 10/27/2012  . Vaginal bleeding 04/30/2012  . S/P left THA, AA 09/12/2011  . Hemorrhoids, external without complications [id number]  . Renal cyst 12/15/2010  . Adrenal nodule ([place]) 12/15/2010  . Liver hemangioma 12/15/2010  . Benign breast cyst in female   . Menopause   . Gall bladder disease     Current Outpatient Prescriptions on File Prior to Visit  Medication Sig Dispense Refill  . Amino Acids (AMINO ACID PO) Take 1 tablet by mouth 2 (two) times daily.    Marland Kitchen b complex vitamins capsule Take 1 capsule by mouth daily.    . Betaine HCl 300 MG TABS Take 300 mg by mouth daily.    . Biotin 1 MG CAPS Take by mouth.    . Cholecalciferol (CVS VIT D 5000 HIGH-POTENCY PO) Take 5,000 Units by mouth daily.    Marland Kitchen DHA-PHOSPHATIDYLSERINE PO Take 100 mg by mouth 2 (two) times daily.    Marland Kitchen EVENING PRIMROSE OIL PO Take by mouth.    . fluocinolone (SYNALAR) 0.01 % external solution   5  . ketoconazole (NIZORAL) 2 % shampoo   99  . LevOCARNitine L-Tartrate (L-CARNITINE) 500 MG CAPS  Take 1,000 mg by mouth daily.    . Magnesium 500 MG CAPS Take 500 mg by mouth daily.    . methimazole (TAPAZOLE) 5 MG tablet Take 2.5 mg by mouth daily.  11  . Omega-3 Fatty Acids (OMEGA 3 PO) Take 1 capsule by mouth 2 (two) times daily.    . Phosphatidyl Choline 65 MG TABS Take 900 mg by mouth 3 (three) times daily.    . Potassium 99 MG TABS Take 99 mg by mouth daily.    Marland Kitchen PREMARIN vaginal cream   4  . Probiotic Product (PROBIOTIC DAILY) CAPS Take 1 capsule by mouth daily.    . Quercetin 250 MG TABS Take 500 mg by mouth daily.    . SELENIUM PO Take 1 capsule by mouth daily.    . Theanine 100 MG CAPS Take 100 mg by mouth 2 (two) times daily.    . vitamin C (ASCORBIC ACID) 500 MG tablet Take 500 mg by mouth daily.    Marland Kitchen zinc gluconate 50 MG tablet Take 50 mg by mouth daily.     No current facility-administered medications on file prior to visit.    Past Medical History  Diagnosis  Date  . Menopause   . Osteoporosis   . Gall bladder disease     per pt report  . GERD (gastroesophageal reflux disease)   . Hemorrhoids   . Arthritis   . Acetabular labrum tear     right/ per pt history  . Thyroid disease     Past Surgical History  Procedure Laterality Date  . Nasal sinus surgery  1995, 2002  . Cystectomy  1999    Right elbow  . Breast surgery  2002    cyst on Left breast  . Total hip arthroplasty  09/12/2011    Procedure: TOTAL HIP ARTHROPLASTY ANTERIOR APPROACH;  Surgeon: Mauri Pole, MD;  Location: [facility];  Service: Orthopedics;  Laterality: Left;  . Joint replacement    . Knee arthroscopy Left 09/10/2013    Procedure: LEFT MEDIAL AND LATERAL KNEE ARTHROSCOPY WITH MENISCAL DEBRIDEMENT ;  Surgeon: Gearlean Alf, MD;  Location: [facility];  Service: Orthopedics;  Laterality: Left;  . Hemorrhoid banding  07/2014    Social History   Social History  . Marital Status: Divorced    Spouse Name: N/A  . Number of Children: N/A  . Years of Education: N/A   Social History Main Topics  . Smoking status: Never Smoker   . Smokeless tobacco: Never Used  . Alcohol Use: 0.6 oz/week    1 Standard drinks or equivalent per week     Comment: once weekly  . Drug Use: No  . Sexual Activity: Not Currently   Other Topics Concern  . None   Social History Narrative    Review of Systems See HPI    Objective:   Filed Vitals:   04/21/15 1121  BP: 106/76  Pulse: 73  Temp: 97.8 F (36.6 C)  Resp: 16   Filed Weights   04/21/15 1121  Weight: 141 lb (63.957 kg)   Body mass index is 22.77 kg/(m^2).   Physical Exam  Constitutional: She appears well-developed and well-nourished. No distress.  Skin: Skin is warm and dry. No rash noted. She is not diaphoretic. No erythema.  Healing closed ulcer on left anterior leg without discharge, no surrounding erythema, mild ecchymosis underlying ulcer          Assessment & Plan:   The reason she came  in today was the  bleeding in her left anterior leg  Skin ulcer Healing without signs of infection She has had 2 other episodes of spontaneous bleeding/blood blisters No obvious cause ? Relation to Mast cell disorder - she will discuss with Dr. Fredderick Phenix Monitor for now only  Discussed further symptoms-many of these are consistent with her histamine allergy and a possible mast cell disorder-blood work done by gastroenterology is pending. She has seen improvement with Pepcid twice a day and will continue it. She did stop the Zyrtec and I advised her to restart it. She will see Dr. Fredderick Phenix for further evaluation and treatment  She is following with an endocrinologist for her thyroid disorder at [facility] would like to have blood work done here I ordered today

## 2015-04-21 NOTE — Progress Notes (Signed)
Pre visit review using our clinic review tool, if applicable. No additional management support is needed unless otherwise documented below in the visit note. 

## 2015-04-21 NOTE — Addendum Note (Signed)
Addended by: Binnie Rail on: 04/21/2015 01:04 PM   Modules accepted: Orders

## 2015-04-23 ENCOUNTER — Telehealth: Payer: Self-pay | Admitting: *Deleted

## 2015-04-23 DIAGNOSIS — Z79899 Other long term (current) drug therapy: Secondary | ICD-10-CM

## 2015-04-23 DIAGNOSIS — R7989 Other specified abnormal findings of blood chemistry: Secondary | ICD-10-CM

## 2015-04-23 NOTE — Telephone Encounter (Signed)
Received call pt states she saw md and she was going to have her to do labs, but wanting to make sure labs are what her endocrinologist is needing as well because she is need to have done. Inform her labs that md already ordered pt is also needing Hepatic level requested by her endocrinologist @ [place]. Inform will add and once md received labs need to be fax to Dr. Lorna Few @ [phone]...Johny Chess

## 2015-05-04 ENCOUNTER — Other Ambulatory Visit (INDEPENDENT_AMBULATORY_CARE_PROVIDER_SITE_OTHER): Payer: BLUE CROSS/BLUE SHIELD

## 2015-05-04 DIAGNOSIS — Z79899 Other long term (current) drug therapy: Secondary | ICD-10-CM

## 2015-05-04 DIAGNOSIS — R7989 Other specified abnormal findings of blood chemistry: Secondary | ICD-10-CM | POA: Diagnosis not present

## 2015-05-04 DIAGNOSIS — E0520 Thyrotoxicosis with toxic multinodular goiter without thyrotoxic crisis or storm: Secondary | ICD-10-CM | POA: Diagnosis not present

## 2015-05-04 LAB — CBC WITH DIFFERENTIAL/PLATELET
BASOS PCT: 0.3 % (ref 0.0–3.0)
Basophils Absolute: 0 10*3/uL (ref 0.0–0.1)
EOS ABS: 0.1 10*3/uL (ref 0.0–0.7)
Eosinophils Relative: 0.8 % (ref 0.0–5.0)
HCT: 42.1 % (ref 36.0–46.0)
HEMOGLOBIN: 13.7 g/dL (ref 12.0–15.0)
LYMPHS ABS: 1.9 10*3/uL (ref 0.7–4.0)
Lymphocytes Relative: 25 % (ref 12.0–46.0)
MCHC: 32.5 g/dL (ref 30.0–36.0)
MCV: 88.8 fl (ref 78.0–100.0)
MONO ABS: 0.4 10*3/uL (ref 0.1–1.0)
Monocytes Relative: 5.6 % (ref 3.0–12.0)
NEUTROS PCT: 68.3 % (ref 43.0–77.0)
Neutro Abs: 5.1 10*3/uL (ref 1.4–7.7)
PLATELETS: 276 10*3/uL (ref 150.0–400.0)
RBC: 4.74 Mil/uL (ref 3.87–5.11)
RDW: 12.8 % (ref 11.5–15.5)
WBC: 7.5 10*3/uL (ref 4.0–10.5)

## 2015-05-04 LAB — HEPATIC FUNCTION PANEL
ALT: 16 U/L (ref 0–35)
AST: 22 U/L (ref 0–37)
Albumin: 5 g/dL (ref 3.5–5.2)
Alkaline Phosphatase: 68 U/L (ref 39–117)
BILIRUBIN DIRECT: 0.2 mg/dL (ref 0.0–0.3)
BILIRUBIN TOTAL: 0.9 mg/dL (ref 0.2–1.2)
Total Protein: 7.7 g/dL (ref 6.0–8.3)

## 2015-05-04 LAB — COMPREHENSIVE METABOLIC PANEL
ALBUMIN: 5 g/dL (ref 3.5–5.2)
ALT: 16 U/L (ref 0–35)
AST: 22 U/L (ref 0–37)
Alkaline Phosphatase: 68 U/L (ref 39–117)
BUN: 16 mg/dL (ref 6–23)
CHLORIDE: 102 meq/L (ref 96–112)
CO2: 31 mEq/L (ref 19–32)
Calcium: 10.3 mg/dL (ref 8.4–10.5)
Creatinine, Ser: 0.78 mg/dL (ref 0.40–1.20)
GFR: 79.49 mL/min (ref >60.00)
GLUCOSE: 94 mg/dL (ref 70–99)
Potassium: 4.6 mEq/L (ref 3.5–5.1)
SODIUM: 141 meq/L (ref 135–145)
Total Bilirubin: 0.9 mg/dL (ref 0.2–1.2)
Total Protein: 7.7 g/dL (ref 6.0–8.3)

## 2015-05-04 LAB — T3, FREE: T3, Free: 3.4 pg/mL (ref 2.3–4.2)

## 2015-05-04 LAB — TSH: TSH: 1.72 u[IU]/mL (ref 0.35–4.50)

## 2015-05-04 LAB — T4, FREE: Free T4: 0.62 ng/dL (ref 0.60–1.60)

## 2015-05-06 ENCOUNTER — Telehealth: Payer: Self-pay | Admitting: Emergency Medicine

## 2015-05-06 NOTE — Telephone Encounter (Signed)
Lab results faxed to [place]

## 2015-05-07 ENCOUNTER — Encounter: Payer: Self-pay | Admitting: Internal Medicine

## 2015-05-13 ENCOUNTER — Encounter: Payer: Self-pay | Admitting: Internal Medicine

## 2015-05-14 ENCOUNTER — Ambulatory Visit (INDEPENDENT_AMBULATORY_CARE_PROVIDER_SITE_OTHER)
Admission: RE | Admit: 2015-05-14 | Discharge: 2015-05-14 | Disposition: A | Discharge status: Home / Self Care | Payer: BLUE CROSS/BLUE SHIELD | Source: Ambulatory Visit | Attending: Nurse Practitioner | Admitting: Nurse Practitioner

## 2015-05-14 ENCOUNTER — Ambulatory Visit (INDEPENDENT_AMBULATORY_CARE_PROVIDER_SITE_OTHER): Payer: BLUE CROSS/BLUE SHIELD | Admitting: Nurse Practitioner

## 2015-05-14 ENCOUNTER — Encounter: Payer: Self-pay | Admitting: Nurse Practitioner

## 2015-05-14 VITALS — BP 108/80 | HR 85 | Temp 98.0°F | Resp 16 | Ht 66.0 in | Wt 139.4 lb

## 2015-05-14 DIAGNOSIS — R1012 Left upper quadrant pain: Secondary | ICD-10-CM

## 2015-05-14 MED ORDER — MECLIZINE HCL 25 MG PO TABS: 25.0000 mg | ORAL_TABLET | Freq: Three times a day (TID) | ORAL | Status: DC | PRN

## 2015-05-14 NOTE — Progress Notes (Signed)
Pre visit review using our clinic review tool, if applicable. No additional management support is needed unless otherwise documented below in the visit note. 

## 2015-05-14 NOTE — Progress Notes (Signed)
Patient ID: Patricia Conley, female    DOB: [birth year]  Age: 65 y.o. MRN: [id number]  CC: Abdominal Pain   HPI Patricia Conley presents for CC of left abdominal pain x 5-6 days.   1) Pt reports left upper abdomen pain x 5-6 days.  More frequent as of recently  Feels stomach on left is painful after eating  Seeing Patricia Conley on 05/24/15   BM- Orange stools with dark specks she reports   "Diarrhea" Tuesday- loose stool  Eye is red on right today  Pt reports feeling Bloated   Contstantly uncomfortable Cramping after eating  Tender, swollen, inflamed feeling  Feels better when standing up     Treatment to date:  Not taking probiotic on list Antacid daily  Zyrtec   History Patricia Conley has a past medical history of Menopause; Osteoporosis; Gall bladder disease; GERD (gastroesophageal reflux disease); Hemorrhoids; Arthritis; Acetabular labrum tear; and Thyroid disease.   She has past surgical history that includes Nasal sinus surgery (1995, 2002); Cystectomy (1999); Breast surgery (2002); Total hip arthroplasty (09/12/2011); Joint replacement; Knee arthroscopy (Left, 09/10/2013); and Hemorrhoid banding (07/2014).   Her family history includes Aneurysm in her mother; Cancer in her mother; Depression in her father; Diabetes in her mother; Drug abuse in her father; Mental illness in her sister; Nephrolithiasis in her mother; Parkinsonism in her father; Stroke in her mother.She reports that she has never smoked. She has never used smokeless tobacco. She reports that she drinks about 0.6 oz of alcohol per week. She reports that she does not use illicit drugs.  Outpatient Prescriptions Prior to Visit  Medication Sig Dispense Refill  . Amino Acids (AMINO ACID PO) Take 1 tablet by mouth 2 (two) times daily.    Marland Kitchen b complex vitamins capsule Take 1 capsule by mouth daily.    . Betaine HCl 300 MG TABS Take 300 mg by mouth daily.    . Biotin 1 MG CAPS Take by mouth.    . Cholecalciferol (CVS VIT  D 5000 HIGH-POTENCY PO) Take 5,000 Units by mouth daily.    Marland Kitchen DHA-PHOSPHATIDYLSERINE PO Take 100 mg by mouth 2 (two) times daily.    Marland Kitchen EVENING PRIMROSE OIL PO Take by mouth.    . fluocinolone (SYNALAR) 0.01 % external solution   5  . ketoconazole (NIZORAL) 2 % shampoo   99  . LevOCARNitine L-Tartrate (L-CARNITINE) 500 MG CAPS Take 1,000 mg by mouth daily.    . Magnesium 500 MG CAPS Take 500 mg by mouth daily.    . methimazole (TAPAZOLE) 5 MG tablet Take 2.5 mg by mouth daily.  11  . Omega-3 Fatty Acids (OMEGA 3 PO) Take 1 capsule by mouth 2 (two) times daily.    . Phosphatidyl Choline 65 MG TABS Take 900 mg by mouth 3 (three) times daily.    . Potassium 99 MG TABS Take 99 mg by mouth daily.    Marland Kitchen PREMARIN vaginal cream   4  . Quercetin 250 MG TABS Take 500 mg by mouth daily.    . SELENIUM PO Take 1 capsule by mouth daily.    . Theanine 100 MG CAPS Take 100 mg by mouth 2 (two) times daily.    . vitamin C (ASCORBIC ACID) 500 MG tablet Take 500 mg by mouth daily.    Marland Kitchen zinc gluconate 50 MG tablet Take 50 mg by mouth daily.    . Probiotic Product (PROBIOTIC DAILY) CAPS Take 1 capsule by mouth daily.  No facility-administered medications prior to visit.    ROS Review of Systems  Constitutional: Positive for appetite change. Negative for fever, chills, diaphoresis and fatigue.       Hungry  Eyes: Positive for redness. Negative for photophobia, pain, itching and visual disturbance.  Respiratory: Negative for cough.   Gastrointestinal: Positive for diarrhea and abdominal distention. Negative for nausea, vomiting, constipation, blood in stool and rectal pain.  Skin: Negative for rash.  Psychiatric/Behavioral: The patient is nervous/anxious.     Objective:  BP 108/80 mmHg  Pulse 85  Temp(Src) 98 F (36.7 C) (Oral)  Resp 16  Ht 5\' 6"  (1.676 m)  Wt 139 lb 6.4 oz (63.231 kg)  BMI 22.51 kg/m2  SpO2 98%  Physical Exam  Constitutional: She is oriented to person, place, and time. She  appears well-developed and well-nourished. No distress.  HENT:  Head: Normocephalic and atraumatic.  Right Ear: External ear normal.  Left Ear: External ear normal.  Eyes: EOM are normal. Pupils are equal, round, and reactive to light. Right eye exhibits no discharge. Left eye exhibits no discharge. No scleral icterus.  Negative for nystagmus  Neck: Normal range of motion. Neck supple.  Cardiovascular: Normal rate, regular rhythm and normal heart sounds.  Exam reveals no gallop and no friction rub.   No murmur heard. Pulmonary/Chest: Effort normal and breath sounds normal. No respiratory distress. She has no wheezes. She has no rales. She exhibits no tenderness.  Abdominal: Soft. Bowel sounds are normal. She exhibits no distension and no mass. There is tenderness. There is no rebound and no guarding.  Generalized tenderness  Lymphadenopathy:    She has no cervical adenopathy.  Neurological: She is alert and oriented to person, place, and time. No cranial nerve deficit. She exhibits normal muscle tone. Coordination normal.  Skin: Skin is warm and dry. No rash noted. She is not diaphoretic.  Psychiatric: She has a normal mood and affect. Her behavior is normal. Judgment normal.  Pt is hard to keep on topic about her acute concern today Visibly anxious   Assessment & Plan:   Patricia Conley was seen today for abdominal pain.  Diagnoses and all orders for this visit:  Abdominal pain, left upper quadrant -     DG Abd 1 View; Future  Other orders -     meclizine (ANTIVERT) 25 MG tablet; Take 1 tablet (25 mg total) by mouth 3 (three) times daily as needed for dizziness.   I have discontinued Patricia Conley PROBIOTIC DAILY. I am also having her start on meclizine. Additionally, I am having her maintain her Magnesium, vitamin C, Potassium, b complex vitamins, Cholecalciferol (CVS VIT D 5000 HIGH-POTENCY PO), SELENIUM PO, Amino Acids (AMINO ACID PO), Omega-3 Fatty Acids (OMEGA 3 PO), zinc gluconate,  EVENING PRIMROSE OIL PO, Biotin, Theanine, DHA-PHOSPHATIDYLSERINE PO, Phosphatidyl Choline, Quercetin, Betaine HCl, L-Carnitine, PREMARIN, methimazole, fluocinolone, ketoconazole, and cetirizine.  Meds ordered this encounter  Medications  . cetirizine (ZYRTEC) 10 MG tablet    Sig: Take 10 mg by mouth daily.  . meclizine (ANTIVERT) 25 MG tablet    Sig: Take 1 tablet (25 mg total) by mouth 3 (three) times daily as needed for dizziness.    Dispense:  30 tablet    Refill:  0    Order Specific Question:  Supervising Provider    Answer:  Crecencio Mc [[id number]]     Follow-up: Return if symptoms worsen or fail to improve.

## 2015-05-14 NOTE — Patient Instructions (Signed)
Please get the x-ray. If we get results tonight we will let you know results, otherwise it will be Monday.   Try the Meclizine for dizziness.   Seek emergency care if something changes like an increase in pain or fever of 100.5 or greater.

## 2015-05-19 DIAGNOSIS — R1012 Left upper quadrant pain: Secondary | ICD-10-CM | POA: Insufficient documentation

## 2015-05-19 NOTE — Assessment & Plan Note (Addendum)
HFP, CMET, CBC w. Diff  and recent labs on 05/04/15 reviewed and were without significant findings.  Pt is well appearing in office except for visibly anxious and hard to keep on topic. Discussed shortly with Dr. Quay Burow. Will get a KUB today. FU after results. Follow up with GI specialist Given meclizine for dizziness

## 2015-06-09 ENCOUNTER — Ambulatory Visit (INDEPENDENT_AMBULATORY_CARE_PROVIDER_SITE_OTHER): Payer: BLUE CROSS/BLUE SHIELD | Admitting: Nurse Practitioner

## 2015-06-09 ENCOUNTER — Encounter: Payer: Self-pay | Admitting: Nurse Practitioner

## 2015-06-09 VITALS — BP 118/68 | HR 100 | Temp 99.7°F | Ht 66.0 in | Wt 143.0 lb

## 2015-06-09 DIAGNOSIS — M791 Myalgia: Secondary | ICD-10-CM | POA: Diagnosis not present

## 2015-06-09 DIAGNOSIS — R6889 Other general symptoms and signs: Secondary | ICD-10-CM | POA: Insufficient documentation

## 2015-06-09 DIAGNOSIS — R5383 Other fatigue: Secondary | ICD-10-CM

## 2015-06-09 DIAGNOSIS — R509 Fever, unspecified: Secondary | ICD-10-CM

## 2015-06-09 DIAGNOSIS — R05 Cough: Secondary | ICD-10-CM

## 2015-06-09 LAB — POCT INFLUENZA A/B
INFLUENZA A, POC: NEGATIVE
INFLUENZA B, POC: NEGATIVE

## 2015-06-09 MED ORDER — OSELTAMIVIR PHOSPHATE 75 MG PO CAPS: 75.0000 mg | ORAL_CAPSULE | Freq: Two times a day (BID) | ORAL | Status: DC

## 2015-06-09 NOTE — Patient Instructions (Addendum)
Rest, alternate tylenol and ibuprofen, lots of fluids  You may have diarrhea, nausea, or vomiting with this- over the counter medications are fine to use. Tamiflu may cause some intestinal symptoms as well.   Seek emergency care if breathing becomes difficult or unable to keep food/fluids down.

## 2015-06-09 NOTE — Progress Notes (Signed)
Patient ID: Patricia Conley, female    DOB: 06-29-1950  Age: 65 y.o. MRN: [id number]  CC: Flu like symptoms   HPI Patricia Conley presents for CC of flu like symptoms.   1) Started at 9 pm last night Fever 101  Dry Cough, intense myalgias No recent exposure  Sick contacts:  Treatment to date:  [place] has a past medical history of Menopause; Osteoporosis; Gall bladder disease; GERD (gastroesophageal reflux disease); Hemorrhoids; Arthritis; Acetabular labrum tear; and Thyroid disease.   She has past surgical history that includes Nasal sinus surgery (1995, 2002); Cystectomy (1999); Breast surgery (2002); Total hip arthroplasty (09/12/2011); Joint replacement; Knee arthroscopy (Left, 09/10/2013); and Hemorrhoid banding (07/2014).   Her family history includes Aneurysm in her mother; Cancer in her mother; Depression in her father; Diabetes in her mother; Drug abuse in her father; Mental illness in her sister; Nephrolithiasis in her mother; Parkinsonism in her father; Stroke in her mother.She reports that she has never smoked. She has never used smokeless tobacco. She reports that she drinks about 0.6 oz of alcohol per week. She reports that she does not use illicit drugs.  Outpatient Prescriptions Prior to Visit  Medication Sig Dispense Refill  . Amino Acids (AMINO ACID PO) Take 1 tablet by mouth 2 (two) times daily.    Marland Kitchen b complex vitamins capsule Take 1 capsule by mouth daily.    . Betaine HCl 300 MG TABS Take 300 mg by mouth daily.    . Biotin 1 MG CAPS Take by mouth.    . cetirizine (ZYRTEC) 10 MG tablet Take 10 mg by mouth daily.    . Cholecalciferol (CVS VIT D 5000 HIGH-POTENCY PO) Take 5,000 Units by mouth daily.    Marland Kitchen DHA-PHOSPHATIDYLSERINE PO Take 100 mg by mouth 2 (two) times daily.    Marland Kitchen EVENING PRIMROSE OIL PO Take by mouth.    . fluocinolone (SYNALAR) 0.01 % external solution   5  . ketoconazole (NIZORAL) 2 % shampoo   99  . LevOCARNitine L-Tartrate  (L-CARNITINE) 500 MG CAPS Take 1,000 mg by mouth daily.    . Magnesium 500 MG CAPS Take 500 mg by mouth daily.    . meclizine (ANTIVERT) 25 MG tablet Take 1 tablet (25 mg total) by mouth 3 (three) times daily as needed for dizziness. 30 tablet 0  . methimazole (TAPAZOLE) 5 MG tablet Take 2.5 mg by mouth daily.  11  . Omega-3 Fatty Acids (OMEGA 3 PO) Take 1 capsule by mouth 2 (two) times daily.    . Phosphatidyl Choline 65 MG TABS Take 900 mg by mouth 3 (three) times daily.    . Potassium 99 MG TABS Take 99 mg by mouth daily.    Marland Kitchen PREMARIN vaginal cream   4  . Quercetin 250 MG TABS Take 500 mg by mouth daily.    . SELENIUM PO Take 1 capsule by mouth daily.    . Theanine 100 MG CAPS Take 100 mg by mouth 2 (two) times daily.    . vitamin C (ASCORBIC ACID) 500 MG tablet Take 500 mg by mouth daily.    Marland Kitchen zinc gluconate 50 MG tablet Take 50 mg by mouth daily.     No facility-administered medications prior to visit.    ROS Review of Systems  Constitutional: Positive for fever, chills, diaphoresis and fatigue.  Respiratory: Positive for cough.   Musculoskeletal: Positive for myalgias.    Objective:  BP 118/68 mmHg  Pulse 100  Temp(Src) 99.7 F (37.6 C) (Oral)  Ht 5\' 6"  (1.676 m)  Wt 143 lb (64.864 kg)  BMI 23.09 kg/m2  SpO2 98%  Physical Exam  Constitutional: She is oriented to person, place, and time. She appears well-developed and well-nourished. No distress.  HENT:  Head: Normocephalic and atraumatic.  Right Ear: External ear normal.  Left Ear: External ear normal.  Mouth/Throat: No oropharyngeal exudate.  Eyes: EOM are normal. Pupils are equal, round, and reactive to light. Right eye exhibits no discharge. Left eye exhibits no discharge. No scleral icterus.  Cardiovascular: Normal rate, regular rhythm and normal heart sounds.  Exam reveals no gallop and no friction rub.   No murmur heard. Pulmonary/Chest: Effort normal and breath sounds normal. No respiratory distress. She has  no wheezes. She has no rales. She exhibits no tenderness.  Neurological: She is alert and oriented to person, place, and time.  Skin: Skin is warm. No rash noted. She is diaphoretic.  Psychiatric: She has a normal mood and affect. Her behavior is normal. Judgment and thought content normal.   Assessment & Plan:   Gabbi was seen today for flu like symptoms.  Diagnoses and all orders for this visit:  Flu-like symptoms -     POCT Influenza A/B  Other orders -     oseltamivir (TAMIFLU) 75 MG capsule; Take 1 capsule (75 mg total) by mouth 2 (two) times daily.   I am having [name] start on oseltamivir. I am also having her maintain her Magnesium, vitamin C, Potassium, b complex vitamins, Cholecalciferol (CVS VIT D 5000 HIGH-POTENCY PO), SELENIUM PO, Amino Acids (AMINO ACID PO), Omega-3 Fatty Acids (OMEGA 3 PO), zinc gluconate, EVENING PRIMROSE OIL PO, Biotin, Theanine, DHA-PHOSPHATIDYLSERINE PO, Phosphatidyl Choline, Quercetin, Betaine HCl, L-Carnitine, PREMARIN, methimazole, fluocinolone, ketoconazole, cetirizine, meclizine, and glucosamine-chondroitin.  Meds ordered this encounter  Medications  . glucosamine-chondroitin (GLUCOSAMINE-CHONDROITIN DS) 500-400 MG tablet    Sig: Take by mouth.  . oseltamivir (TAMIFLU) 75 MG capsule    Sig: Take 1 capsule (75 mg total) by mouth 2 (two) times daily.    Dispense:  10 capsule    Refill:  0    Order Specific Question:  Supervising Provider    Answer:  Crecencio Mc [[id number]]     Follow-up: Return if symptoms worsen or fail to improve.

## 2015-06-09 NOTE — Progress Notes (Signed)
Pre visit review using our clinic review tool, if applicable. No additional management support is needed unless otherwise documented below in the visit note. 

## 2015-06-09 NOTE — Assessment & Plan Note (Signed)
New onset Early symptoms Negative POCT Influenza Treating with Tamiflu due to appearance of pt today  Advised fluids, rest, work note given  Alternate Tylenol and ibuprofen  Advised on when to go to ER FU prn worsening/failure to improve.

## 2015-06-10 ENCOUNTER — Telehealth: Payer: Self-pay | Admitting: Internal Medicine

## 2015-06-10 NOTE — Telephone Encounter (Signed)
Patient called in. Saw Patricia Conley yesterday for flu like sx. States that at that time, she didn't have a cough, but has since developed one. Non-productive and dry. Her fever has broken as of this mroning. She is asking if there is anything that she should take for the cough? Pharmacy is [facility]

## 2015-06-10 NOTE — Telephone Encounter (Signed)
I would recommend either Delsym or Robutussin. If she is needing something for sleep she will have to come and pick it up.

## 2015-07-22 ENCOUNTER — Encounter (INDEPENDENT_AMBULATORY_CARE_PROVIDER_SITE_OTHER): Payer: Self-pay

## 2015-07-27 ENCOUNTER — Encounter: Payer: Self-pay | Admitting: Internal Medicine

## 2015-07-27 ENCOUNTER — Ambulatory Visit (INDEPENDENT_AMBULATORY_CARE_PROVIDER_SITE_OTHER): Payer: BLUE CROSS/BLUE SHIELD | Admitting: Internal Medicine

## 2015-07-27 VITALS — BP 108/70 | HR 78 | Temp 98.0°F | Resp 16 | Wt 138.0 lb

## 2015-07-27 DIAGNOSIS — R1012 Left upper quadrant pain: Secondary | ICD-10-CM | POA: Diagnosis not present

## 2015-07-27 DIAGNOSIS — E279 Disorder of adrenal gland, unspecified: Secondary | ICD-10-CM | POA: Diagnosis not present

## 2015-07-27 DIAGNOSIS — B002 Herpesviral gingivostomatitis and pharyngotonsillitis: Secondary | ICD-10-CM

## 2015-07-27 DIAGNOSIS — E278 Other specified disorders of adrenal gland: Secondary | ICD-10-CM

## 2015-07-27 DIAGNOSIS — M810 Age-related osteoporosis without current pathological fracture: Secondary | ICD-10-CM

## 2015-07-27 DIAGNOSIS — R5382 Chronic fatigue, unspecified: Secondary | ICD-10-CM | POA: Diagnosis not present

## 2015-07-27 DIAGNOSIS — A6000 Herpesviral infection of urogenital system, unspecified: Secondary | ICD-10-CM | POA: Insufficient documentation

## 2015-07-27 MED ORDER — VALACYCLOVIR HCL 500 MG PO TABS: 500.0000 mg | ORAL_TABLET | Freq: Every day | ORAL | Status: DC

## 2015-07-27 NOTE — Patient Instructions (Signed)
Pulmonary referral was ordered  A dexa referral was ordered for [facility].  Valtrex was sent to your pharmacy.

## 2015-07-27 NOTE — Progress Notes (Signed)
Subjective:    Patient ID: Patricia Conley, female    DOB: 14-Mar-1951, 65 y.o.   MRN: [id number]  HPI She went to Dr Tessa Lerner in [place], [place].  It was advised she is evaluated for carcinoid.  She Had some of the testing done last summer by endocrine. He has a thought she should have a CT scan of her abdomen to evaluate her chronic abdominal pain-she plans on discussing this with her gastroenterologist..  Her endocrinologist wants her adrenal adenoma re-evaluated.  Her last ct scan was 2014.  It was stable at that time. Her first scan on file was 2010.  ? OSA:  Several years ago she started choking when she was sleeping.  She wakes up tired after sleeping 8 hours. 2 of her siblings have sleep apnea and she was wondering if she should be evaluated. She does wear a mouthguard for grinding.  Get herpes virus in her mouth or on the left side of her face.  She was getting it 1-2 times a year.  For the past 6 weeks she has been getting a cold sore every week.  She wondered about taking a daily medication for suppression. She currently takes Valtrex as needed.  Osteoporosis:  She walks 10 miles a week.  She takes vitamin D and calcium.  She wonders if she should reconsider treatment. Last summer after her bone density she was not interested in treatment, but wonders if she needs to reconsider. She was wondering if she could get a repeat bone density scan to reevaluate if there has been any significant changes in the past year. She does have an overactive thyroid and wonders if that is part of the cause for her osteoporosis.  Medications and allergies reviewed with patient and updated if appropriate.  Patient Active Problem List   Diagnosis Date Noted  . Abdominal pain, left upper quadrant 05/19/2015  . Osteoporosis 03/25/2015  . Dry eye 03/25/2015  . Elevated urinary free cortisol level 10/29/2014  . Hair loss 09/18/2014  . 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 Acute medial meniscal  tear 09/10/2013  . Abnormal TSH 08/25/2013  . Bunion of great toe 11/15/2012  . Arthralgia of ankle or foot 11/15/2012  . DJD (degenerative joint disease) 11/11/2012  . History of gout 11/11/2012  . GERD (gastroesophageal reflux disease) 10/27/2012  . Vaginal bleeding 04/30/2012  . S/P left THA, AA 09/12/2011  . Hemorrhoids, external without complications [id number]  . Renal cyst 12/15/2010  . Adrenal nodule ([place]) 12/15/2010  . Liver hemangioma 12/15/2010  . Benign breast cyst in female   . Menopause   . Gall bladder disease     Current Outpatient Prescriptions on File Prior to Visit  Medication Sig Dispense Refill  . Amino Acids (AMINO ACID PO) Take 1 tablet by mouth 2 (two) times daily.    Marland Kitchen b complex vitamins capsule Take 1 capsule by mouth daily.    . Betaine HCl 300 MG TABS Take 300 mg by mouth daily.    . Biotin 1 MG CAPS Take by mouth.    . cetirizine (ZYRTEC) 10 MG tablet Take 10 mg by mouth daily.    . Cholecalciferol ([employer]S VIT D 5000 HIGH-POTENCY PO) Take 5,000 Units by mouth daily.    Marland Kitchen DHA-PHOSPHATIDYLSERINE PO Take 100 mg by mouth 2 (two) times daily.    Marland Kitchen EVENING PRIMROSE OIL PO Take by mouth.    Marland Kitchen glucosamine-chondroitin (GLUCOSAMINE-CHONDROITIN DS) 500-400 MG tablet Take by mouth.    Marland Kitchen  ketoconazole (NIZORAL) 2 % shampoo   99  . LevOCARNitine L-Tartrate (L-CARNITINE) 500 MG CAPS Take 1,000 mg by mouth daily.    . Magnesium 500 MG CAPS Take 500 mg by mouth daily.    . methimazole (TAPAZOLE) 5 MG tablet Take 2.5 mg by mouth daily.  11  . Omega-3 Fatty Acids (OMEGA 3 PO) Take 1 capsule by mouth 2 (two) times daily.    . Phosphatidyl Choline 65 MG TABS Take 900 mg by mouth 3 (three) times daily.    . Potassium 99 MG TABS Take 99 mg by mouth daily.    Marland Kitchen PREMARIN vaginal cream   4  . Quercetin 250 MG TABS Take 500 mg by mouth daily.    . SELENIUM PO Take 1 capsule by mouth daily.    . Theanine 100 MG CAPS Take 100 mg by mouth 2 (two) times daily.    . vitamin C  (ASCORBIC ACID) 500 MG tablet Take 500 mg by mouth daily.    Marland Kitchen zinc gluconate 50 MG tablet Take 50 mg by mouth daily.     No current facility-administered medications on file prior to visit.    Past Medical History  Diagnosis Date  . Menopause   . Osteoporosis   . Gall bladder disease     per pt report  . GERD (gastroesophageal reflux disease)   . Hemorrhoids   . Arthritis   . Acetabular labrum tear     right/ per pt history  . Thyroid disease     Past Surgical History  Procedure Laterality Date  . Nasal sinus surgery  1995, 2002  . Cystectomy  1999    Right elbow  . Breast surgery  2002    cyst on Left breast  . Total hip arthroplasty  09/12/2011    Procedure: TOTAL HIP ARTHROPLASTY ANTERIOR APPROACH;  Surgeon: Mauri Pole, MD;  Location: [facility];  Service: Orthopedics;  Laterality: Left;  . Joint replacement    . Knee arthroscopy Left 09/10/2013    Procedure: LEFT MEDIAL AND LATERAL KNEE ARTHROSCOPY WITH MENISCAL DEBRIDEMENT ;  Surgeon: Gearlean Alf, MD;  Location: [facility];  Service: Orthopedics;  Laterality: Left;  . Hemorrhoid banding  07/2014    Social History   Social History  . Marital Status: Divorced    Spouse Name: N/A  . Number of Children: N/A  . Years of Education: N/A   Social History Main Topics  . Smoking status: Never Smoker   . Smokeless tobacco: Never Used  . Alcohol Use: 0.6 oz/week    1 Standard drinks or equivalent per week     Comment: once weekly  . Drug Use: No  . Sexual Activity: Not Currently   Other Topics Concern  . Not on file   Social History Narrative    Family History  Problem Relation Age of Onset  . Stroke Mother   . Nephrolithiasis Mother   . Diabetes Mother   . Aneurysm Mother     x2  . Cancer Mother     lymphoma, breast cancer  . Depression Father   . Parkinsonism Father   . Drug abuse Father   . Mental illness Sister     paranoia    Review of Systems     Objective:   Filed Vitals:   07/27/15 0958    BP: 108/70  Pulse: 78  Temp: 98 F (36.7 C)  Resp: 16   Filed Weights   07/27/15 0958  Weight:  138 lb (62.596 kg)   Body mass index is 22.28 kg/(m^2).   Physical Exam        Assessment & Plan:   She is here today with several questions and concerns. We spent 20 minutes discussing her concerns and ordering tests/coordinating care.  Osteoporosis Will order another bone density Her overactive thyroid may be contributing at this has improved She is also taking calcium and vitamin D daily and has been exercising If she still has osteoporosis she would consider treatment-she is interested in forteo  Possible carcinoid syndrome She will discuss this further with her gastroenterologist Endocrine the past has been some hormone testing and this was negative We'll have her discuss with GI  Chronic abdominal pain She has been following with GI and no cause has been identified We'll discuss a possible CT scan with him  Adrenal adenoma Has remained stable for at least 4 years No repeat imaging is likely needed She will discuss with her endocrinologist  Possible sleep apnea We'll refer to pulmonary for further evaluation and sleep study  Recurrent oral herpes Start valtrex 500 mg daily - will re-evaluate in a few months  Follow-up as needed   20 minutes were spent face-to-face with the patient, over 50% of which was spent coordinating care

## 2015-08-05 ENCOUNTER — Ambulatory Visit (INDEPENDENT_AMBULATORY_CARE_PROVIDER_SITE_OTHER): Payer: BLUE CROSS/BLUE SHIELD | Admitting: Internal Medicine

## 2015-08-05 ENCOUNTER — Encounter: Payer: Self-pay | Admitting: Internal Medicine

## 2015-08-05 ENCOUNTER — Telehealth: Payer: Self-pay | Admitting: Internal Medicine

## 2015-08-05 ENCOUNTER — Other Ambulatory Visit (INDEPENDENT_AMBULATORY_CARE_PROVIDER_SITE_OTHER): Payer: BLUE CROSS/BLUE SHIELD

## 2015-08-05 VITALS — BP 102/68 | HR 82 | Temp 97.8°F | Ht 66.0 in | Wt 138.0 lb

## 2015-08-05 DIAGNOSIS — E0520 Thyrotoxicosis with toxic multinodular goiter without thyrotoxic crisis or storm: Secondary | ICD-10-CM | POA: Diagnosis not present

## 2015-08-05 DIAGNOSIS — K219 Gastro-esophageal reflux disease without esophagitis: Secondary | ICD-10-CM | POA: Diagnosis not present

## 2015-08-05 DIAGNOSIS — R000 Tachycardia, unspecified: Secondary | ICD-10-CM

## 2015-08-05 DIAGNOSIS — R002 Palpitations: Secondary | ICD-10-CM | POA: Diagnosis not present

## 2015-08-05 LAB — T4, FREE: FREE T4: 0.64 ng/dL (ref 0.60–1.60)

## 2015-08-05 LAB — TSH: TSH: 2.28 u[IU]/mL (ref 0.35–4.50)

## 2015-08-05 NOTE — Telephone Encounter (Signed)
Patient Name: Patricia Conley  DOB: [birth year]    Initial Comment Caller states heart rate is high, around 95 resting, she is considered hyperthroid. Asking if she can get her TSH checked again    Nurse Assessment  Nurse: Mallie Mussel, RN, Alveta Heimlich Date/Time Patricia Conley Time): 08/05/2015 9:13:48 AM  Confirm and document reason for call. If symptomatic, describe symptoms. You must click the next button to save text entered. ---Caller states that her heart rate was 95 earlier this morning but is in the 80's now. She is considered to be hyperthyroidism. She is asking if she can have her TSH checked again.  Has the patient traveled out of the country within the last 30 days? ---No  Does the patient have any new or worsening symptoms? ---Yes  Will a triage be completed? ---Yes  Related visit to physician within the last 2 weeks? ---No  Does the PT have any chronic conditions? (i.e. diabetes, asthma, etc.) ---Yes  List chronic conditions. ---Hyperthyroidism  Is this a behavioral health or substance abuse call? ---No     Guidelines    Guideline Title Affirmed Question Affirmed Notes  Heart Rate and Heartbeat Questions New or worsened shortness of breath with activity (dyspnea on exertion)    Final Disposition User   Go to ED Now (or PCP triage) Mallie Mussel, RN, Alveta Heimlich    Comments  she has some SOB with exertion only with this.  I checked Dr. Quay Burow schedule, but nothing was available for today. Dr. Cathlean Cower has appointment but only after 5:30pm. I checked [facility] and NP Almira Coaster has a 1:00pm availble. Caller states she would prefer to wait until 5:30pm to be seen. Advised her that someone from the office will be calling her about this. She verbalized understanding.   Referrals  REFERRED TO PCP OFFICE   Disagree/Comply: Comply

## 2015-08-05 NOTE — Telephone Encounter (Signed)
Please advise 

## 2015-08-05 NOTE — Progress Notes (Signed)
Pre visit review using our clinic review tool, if applicable. No additional management support is needed unless otherwise documented below in the visit note. 

## 2015-08-05 NOTE — Telephone Encounter (Signed)
Forwarding msg to PCP & assistant for md response...Johny Chess

## 2015-08-05 NOTE — Telephone Encounter (Signed)
See other phone note.  Should ideally be seen, but I can order tsh.

## 2015-08-05 NOTE — Progress Notes (Signed)
Subjective:    Patient ID: Patricia Conley, female    DOB: Mar 08, 1951, 65 y.o.   MRN: [id number]  HPI  Here with yr hx of palpitations, now worse in the past 3 wks, with heart racing with effort such as yardwork to 145 on the [employer] with fluttering in the chest, can last several minutes, some assoc with sob, but no CP, n/v, diaphoresis, dizziness or syncope.  Has hx of thyroid toxicity as recent as feb 2017 placed on methimazole per endo, but has already been to lab today and TSH/free t4 is normal.  Good compliance with meds.   No documented hx of SVT or afib.  Pt denies new neurological symptoms such as new headache, or facial or extremity weakness or numbness   Pt denies polydipsia, polyuria, No recent overt bleeding or anemia Denies worsening reflux, abd pain, dysphagia, n/v, bowel change or blood. Past Medical History  Diagnosis Date  . Menopause   . Osteoporosis   . Gall bladder disease     per pt report  . GERD (gastroesophageal reflux disease)   . Hemorrhoids   . Arthritis   . Acetabular labrum tear     right/ per pt history  . Thyroid disease    Past Surgical History  Procedure Laterality Date  . Nasal sinus surgery  1995, 2002  . Cystectomy  1999    Right elbow  . Breast surgery  2002    cyst on Left breast  . Total hip arthroplasty  09/12/2011    Procedure: TOTAL HIP ARTHROPLASTY ANTERIOR APPROACH;  Surgeon: Mauri Pole, MD;  Location: [facility];  Service: Orthopedics;  Laterality: Left;  . Joint replacement    . Knee arthroscopy Left 09/10/2013    Procedure: LEFT MEDIAL AND LATERAL KNEE ARTHROSCOPY WITH MENISCAL DEBRIDEMENT ;  Surgeon: Gearlean Alf, MD;  Location: [facility];  Service: Orthopedics;  Laterality: Left;  . Hemorrhoid banding  07/2014    reports that she has never smoked. She has never used smokeless tobacco. She reports that she drinks about 0.6 oz of alcohol per week. She reports that she does not use illicit drugs. family history includes Aneurysm  in her mother; Cancer in her mother; Depression in her father; Diabetes in her mother; Drug abuse in her father; Mental illness in her sister; Nephrolithiasis in her mother; Parkinsonism in her father; Stroke in her mother. Allergies  Allergen Reactions  . Histamine Rash    Flushing   Current Outpatient Prescriptions on File Prior to Visit  Medication Sig Dispense Refill  . Amino Acids (AMINO ACID PO) Take 1 tablet by mouth 2 (two) times daily.    Marland Kitchen b complex vitamins capsule Take 1 capsule by mouth daily.    . Betaine HCl 300 MG TABS Take 300 mg by mouth daily.    . Biotin 1 MG CAPS Take by mouth.    . cetirizine (ZYRTEC) 10 MG tablet Take 10 mg by mouth daily.    . Cholecalciferol (CVS VIT D 5000 HIGH-POTENCY PO) Take 5,000 Units by mouth daily.    Marland Kitchen DHA-PHOSPHATIDYLSERINE PO Take 100 mg by mouth 2 (two) times daily.    Marland Kitchen EVENING PRIMROSE OIL PO Take by mouth.    . Famotidine (PEPCID PO) Take 10 mg by mouth 2 (two) times daily.    Marland Kitchen glucosamine-chondroitin (GLUCOSAMINE-CHONDROITIN DS) 500-400 MG tablet Take by mouth.    Marland Kitchen ketoconazole (NIZORAL) 2 % shampoo   99  . LevOCARNitine L-Tartrate (L-CARNITINE) 500  MG CAPS Take 1,000 mg by mouth daily.    . Magnesium 500 MG CAPS Take 500 mg by mouth daily.    . methimazole (TAPAZOLE) 5 MG tablet Take 2.5 mg by mouth daily.  11  . Omega-3 Fatty Acids (OMEGA 3 PO) Take 1 capsule by mouth 2 (two) times daily.    . Phosphatidyl Choline 65 MG TABS Take 900 mg by mouth 3 (three) times daily.    . Potassium 99 MG TABS Take 99 mg by mouth daily.    Marland Kitchen PREMARIN vaginal cream   4  . Quercetin 250 MG TABS Take 500 mg by mouth daily.    . SELENIUM PO Take 1 capsule by mouth daily.    . Theanine 100 MG CAPS Take 100 mg by mouth 2 (two) times daily.    . valACYclovir (VALTREX) 500 MG tablet Take 1 tablet (500 mg total) by mouth daily. 90 tablet 1  . vitamin C (ASCORBIC ACID) 500 MG tablet Take 500 mg by mouth daily.    Marland Kitchen zinc gluconate 50 MG tablet Take 50  mg by mouth daily.     No current facility-administered medications on file prior to visit.   Review of Systems  Constitutional: Negative for unusual diaphoresis or night sweats HENT: Negative for ear swelling or discharge Eyes: Negative for worsening visual haziness  Respiratory: Negative for choking and stridor.   Gastrointestinal: Negative for distension or worsening eructation Genitourinary: Negative for retention or change in urine volume.  Musculoskeletal: Negative for other MSK pain or swelling Skin: Negative for color change and worsening wound Neurological: Negative for tremors and numbness other than noted  Psychiatric/Behavioral: Negative for decreased concentration or agitation other than above       Objective:   Physical Exam BP 102/68 mmHg  Pulse 82  Temp(Src) 97.8 F (36.6 C) (Oral)  Ht 5\' 6"  (1.676 m)  Wt 138 lb (62.596 kg)  BMI 22.28 kg/m2  SpO2 97% VS noted,  Constitutional: Pt appears in no apparent distress HENT: Head: NCAT.  Right Ear: External ear normal.  Left Ear: External ear normal.  Eyes: . Pupils are equal, round, and reactive to light. Conjunctivae and EOM are normal Neck: Normal range of motion. Neck supple.  Cardiovascular: Normal rate and regular rhythm.   Pulmonary/Chest: Effort normal and breath sounds without rales or wheezing.  Neurological: Pt is alert. Not confused , motor grossly intact Skin: Skin is warm. No rash, no LE edema Psychiatric: Pt behavior is normal. No agitation.   Most recent echo summary :  May 2016  [address], [place] [address]  [phone]  ------------------------------------------------------------------- Transthoracic Echocardiography  Patient: Patricia Conley, Patricia Conley MR #: [id number] Study Date: 10/22/2014 Gender: F Age: 54 Height: 167.6 cm Weight: 61.2 kg BSA: 1.69 m^2 Pt.  Status: Room:  ATTENDING Fransico Him, MD SONOGRAPHER Cindy Hazy, RDCS ORDERING [facility] PERFORMING [facility], Outpatient  cc:  -------------------------------------------------------------------  ------------------------------------------------------------------- Indications: R00.2 Palpitations.  ------------------------------------------------------------------- History: PMH: Acquired from the patient and from the patient&'s chart. PMH: Bradycardia. Degenerative Joint Disease.  ------------------------------------------------------------------- Study Conclusions  - Left ventricle: There is a false tendon in the LV apex of no  clinical significance. The cavity size was normal. Systolic  function was normal. Wall motion was normal; there were no  regional wall motion abnormalities. Features are consistent with  a pseudonormal left ventricular filling pattern, with concomitant  abnormal relaxation and increased filling pressure (grade 2  diastolic dysfunction). - Aortic valve:  Trileaflet; mildly thickened, mildly calcified  leaflets. - Mitral valve: Trivial, late systolicsystolic bowing without  prolapse, involving the anterior leaflet. - Tricuspid valve: There was moderate regurgitation.  Lab Results  Component Value Date   TSH 2.28 08/05/2015   ecg - NSR 82, non specific ST wave changes    Assessment & Plan:

## 2015-08-05 NOTE — Assessment & Plan Note (Signed)
To cont methimazole same dose,  Lab Results  Component Value Date   TSH 2.28 08/05/2015    to f/u any worsening symptoms or concerns

## 2015-08-05 NOTE — Telephone Encounter (Signed)
Patient states that the last two weeks her heart rate has been higher.  She would like a lab for TSH to be done.  Believes this is in relation to Thyroid.  States right now resting heart rate is 99.  I have sent patient over to [facility] to be triaged.

## 2015-08-05 NOTE — Telephone Encounter (Signed)
Spoke with pt. Appt scheduled with Dr Jenny Reichmann. Labs ordered.

## 2015-08-05 NOTE — Assessment & Plan Note (Signed)
Etiology not clear, cant r/o svt or afib, thyroid functions normal, d/w pt - for cardiac event monitor,  to f/u any worsening symptoms or concerns, consider cardiology f/u with Dr Caryl Comes

## 2015-08-05 NOTE — Patient Instructions (Signed)
Please continue all other medications as before, and refills have been done if requested.  Please have the pharmacy call with any other refills you may need.  Please keep your appointments with your specialists as you may have planned  You will be contacted regarding the referral for: Cardiac Event Monitor

## 2015-08-05 NOTE — Assessment & Plan Note (Signed)
stable overall by history and exam, recent data reviewed with pt, and pt to continue medical treatment as before,  to f/u any worsening symptoms or concerns Lab Results  Component Value Date   WBC 7.5 05/04/2015   HGB 13.7 05/04/2015   HCT 42.1 05/04/2015   PLT 276.0 05/04/2015   GLUCOSE 94 05/04/2015   CHOL 161 08/17/2014   TRIG 81 08/17/2014   HDL 74 08/17/2014   LDLCALC 71 08/17/2014   ALT 16 05/04/2015   ALT 16 05/04/2015   AST 22 05/04/2015   AST 22 05/04/2015   NA 141 05/04/2015   K 4.6 05/04/2015   CL 102 05/04/2015   CREATININE 0.78 05/04/2015   BUN 16 05/04/2015   CO2 31 05/04/2015   TSH 2.28 08/05/2015   INR 0.98 11/20/2012

## 2015-08-05 NOTE — Telephone Encounter (Signed)
See triage note - should probably be seen.  I can order tsh, but should ideally be seen.

## 2015-08-06 ENCOUNTER — Telehealth: Payer: Self-pay | Admitting: Internal Medicine

## 2015-08-08 ENCOUNTER — Encounter: Payer: Self-pay | Admitting: Internal Medicine

## 2015-08-12 ENCOUNTER — Ambulatory Visit (INDEPENDENT_AMBULATORY_CARE_PROVIDER_SITE_OTHER): Payer: BLUE CROSS/BLUE SHIELD

## 2015-08-12 DIAGNOSIS — R002 Palpitations: Secondary | ICD-10-CM | POA: Diagnosis not present

## 2015-08-18 ENCOUNTER — Encounter: Payer: Self-pay | Admitting: Internal Medicine

## 2015-08-19 NOTE — Telephone Encounter (Signed)
Lovena Le can you check on these and let her know.  thanks

## 2015-09-06 NOTE — Telephone Encounter (Signed)
Pt got her monitor on 08/12/2015

## 2015-09-21 ENCOUNTER — Encounter: Payer: Self-pay | Admitting: Internal Medicine

## 2015-09-22 LAB — HM MAMMOGRAPHY

## 2015-09-24 ENCOUNTER — Encounter: Payer: Self-pay | Admitting: Internal Medicine

## 2015-10-03 ENCOUNTER — Encounter: Payer: Self-pay | Admitting: Internal Medicine

## 2015-10-12 ENCOUNTER — Encounter: Payer: Self-pay | Admitting: Internal Medicine

## 2015-10-12 ENCOUNTER — Ambulatory Visit (INDEPENDENT_AMBULATORY_CARE_PROVIDER_SITE_OTHER): Payer: BLUE CROSS/BLUE SHIELD | Admitting: Internal Medicine

## 2015-10-12 VITALS — BP 114/84 | HR 73 | Temp 98.0°F | Resp 16 | Wt 138.0 lb

## 2015-10-12 DIAGNOSIS — M810 Age-related osteoporosis without current pathological fracture: Secondary | ICD-10-CM

## 2015-10-12 NOTE — Assessment & Plan Note (Signed)
Reviewed dexa Stressed regular exercise and continuing calcium and vitamin d daily Discussed importance of keep thyroid in normal range.  Discussed treatment options - fosamax, prolia, reclast, and forteo She will let me know if she wants to try one

## 2015-10-12 NOTE — Patient Instructions (Addendum)
Think about osteoporosis medication and let me know what you decide.    Osteoporosis Osteoporosis is the thinning and loss of density in the bones. Osteoporosis makes the bones more brittle, fragile, and likely to break (fracture). Over time, osteoporosis can cause the bones to become so weak that they fracture after a simple fall. The bones most likely to fracture are the bones in the hip, wrist, and spine. CAUSES  The exact cause is not known. RISK FACTORS Anyone can develop osteoporosis. You may be at greater risk if you have a family history of the condition or have poor nutrition. You may also have a higher risk if you are:   Female.   63 years old or older.  A smoker.  Not physically active.   White or Asian.  Slender. SIGNS AND SYMPTOMS  A fracture might be the first sign of the disease, especially if it results from a fall or injury that would not usually cause a bone to break. Other signs and symptoms include:   Low back and neck pain.  Stooped posture.  Height loss. DIAGNOSIS  To make a diagnosis, your health care provider may:  Take a medical history.  Perform a physical exam.  Order tests, such as:  A bone mineral density test.  A dual-energy X-ray absorptiometry test. TREATMENT  The goal of osteoporosis treatment is to strengthen your bones to reduce your risk of a fracture. Treatment may involve:  Making lifestyle changes, such as:  Eating a diet rich in calcium.  Doing weight-bearing and muscle-strengthening exercises.  Stopping tobacco use.  Limiting alcohol intake.  Taking medicine to slow the process of bone loss or to increase bone density.  Monitoring your levels of calcium and vitamin D. HOME CARE INSTRUCTIONS  Include calcium and vitamin D in your diet. Calcium is important for bone health, and vitamin D helps the body absorb calcium.  Perform weight-bearing and muscle-strengthening exercises as directed by your health care  provider.  Do not use any tobacco products, including cigarettes, chewing tobacco, and electronic cigarettes. If you need help quitting, ask your health care provider.  Limit your alcohol intake.  Take medicines only as directed by your health care provider.  Keep all follow-up visits as directed by your health care provider. This is important.  Take precautions at home to lower your risk of falling, such as:  Keeping rooms well lit and clutter free.  Installing safety rails on stairs.  Using rubber mats in the bathroom and other areas that are often wet or slippery. SEEK IMMEDIATE MEDICAL CARE IF:  You fall or injure yourself.    This information is not intended to replace advice given to you by your health care provider. Make sure you discuss any questions you have with your health care provider.   Document Released: 01/18/2005 Document Revised: 05/01/2014 Document Reviewed: 09/18/2013 Elsevier Interactive Patient Education [employer].

## 2015-10-12 NOTE — Progress Notes (Signed)
Pre visit review using our clinic review tool, if applicable. No additional management support is needed unless otherwise documented below in the visit note. 

## 2015-10-12 NOTE — Progress Notes (Signed)
Subjective:    Patient ID: Patricia Conley, female    DOB: 1950-10-20, 65 y.o.   MRN: [id number]  HPI She is here to discuss her dexa.   She is walking regularly for exercise - she has been consistent with it since september.  She is taking calcium and vitamin D daily.  She has been on fosamax in the past, but it was years ago. 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  She has not been on any other medication.  There was a slight decline in her bone density since last year.  She has hyperthyroidism and for about 1.5 years her thyroid may have been underactive.  Her last blood test revealed normal thyroid function. She is taking her thyroid medication daily.   Medications and allergies reviewed with patient and updated if appropriate.  Patient Active Problem List   Diagnosis Date Noted  . Palpitations 08/05/2015  . 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 Abdominal pain, left upper quadrant 05/19/2015  . Osteoporosis 03/25/2015  . Dry eye 03/25/2015  . Elevated urinary free cortisol level 10/29/2014  . Hair loss 09/18/2014  . 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 Acute medial meniscal tear 09/10/2013  . Abnormal TSH 08/25/2013  . Bunion of great toe 11/15/2012  . Arthralgia of ankle or foot 11/15/2012  . DJD (degenerative joint disease) 11/11/2012  . History of gout 11/11/2012  . GERD (gastroesophageal reflux disease) 10/27/2012  . Vaginal bleeding 04/30/2012  . S/P left THA, AA 09/12/2011  . Hemorrhoids, external without complications [id number]  . Renal cyst 12/15/2010  . Adrenal nodule ([place]) 12/15/2010  . Liver hemangioma 12/15/2010  . Benign breast cyst in female   . Menopause   . Gall bladder disease     Current Outpatient Prescriptions on File Prior to Visit  Medication Sig Dispense Refill  . Amino Acids (AMINO ACID PO) Take 1 tablet by mouth 2 (two) times daily.    Marland Kitchen b complex vitamins capsule Take 1 capsule by mouth daily.    .  Betaine HCl 300 MG TABS Take 300 mg by mouth daily.    . Biotin 1 MG CAPS Take by mouth.    . cetirizine (ZYRTEC) 10 MG tablet Take 10 mg by mouth daily.    . Cholecalciferol ([employer]S VIT D 5000 HIGH-POTENCY PO) Take 5,000 Units by mouth daily.    Marland Kitchen DHA-PHOSPHATIDYLSERINE PO Take 100 mg by mouth 2 (two) times daily.    Marland Kitchen EVENING PRIMROSE OIL PO Take by mouth.    . Famotidine (PEPCID PO) Take 10 mg by mouth 2 (two) times daily.    Marland Kitchen glucosamine-chondroitin (GLUCOSAMINE-CHONDROITIN DS) 500-400 MG tablet Take by mouth.    Marland Kitchen ketoconazole (NIZORAL) 2 % shampoo   99  . LevOCARNitine L-Tartrate (L-CARNITINE) 500 MG CAPS Take 1,000 mg by mouth daily.    . Magnesium 500 MG CAPS Take 500 mg by mouth daily.    . methimazole (TAPAZOLE) 5 MG tablet Take 2.5 mg by mouth daily.  11  . Omega-3 Fatty Acids (OMEGA 3 PO) Take 1 capsule by mouth 2 (two) times daily.    . Phosphatidyl Choline 65 MG TABS Take 900 mg by mouth 3 (three) times daily.    . Potassium 99 MG TABS Take 99 mg by mouth daily.    Marland Kitchen PREMARIN vaginal cream   4  . Quercetin 250 MG TABS Take 500 mg by mouth daily.    . SELENIUM PO Take 1 capsule  by mouth daily.    . Theanine 100 MG CAPS Take 100 mg by mouth 2 (two) times daily.    . valACYclovir (VALTREX) 500 MG tablet Take 1 tablet (500 mg total) by mouth daily. 90 tablet 1  . vitamin C (ASCORBIC ACID) 500 MG tablet Take 500 mg by mouth daily.    Marland Kitchen zinc gluconate 50 MG tablet Take 50 mg by mouth daily.     No current facility-administered medications on file prior to visit.    Past Medical History  Diagnosis Date  . Menopause   . Osteoporosis   . Gall bladder disease     per pt report  . GERD (gastroesophageal reflux disease)   . Hemorrhoids   . Arthritis   . Acetabular labrum tear     right/ per pt history  . Thyroid disease     Past Surgical History  Procedure Laterality Date  . Nasal sinus surgery  1995, 2002  . Cystectomy  1999    Right elbow  . Breast surgery  2002     cyst on Left breast  . Total hip arthroplasty  09/12/2011    Procedure: TOTAL HIP ARTHROPLASTY ANTERIOR APPROACH;  Surgeon: Mauri Pole, MD;  Location: [facility];  Service: Orthopedics;  Laterality: Left;  . Joint replacement    . Knee arthroscopy Left 09/10/2013    Procedure: LEFT MEDIAL AND LATERAL KNEE ARTHROSCOPY WITH MENISCAL DEBRIDEMENT ;  Surgeon: Gearlean Alf, MD;  Location: [facility];  Service: Orthopedics;  Laterality: Left;  . Hemorrhoid banding  07/2014    Social History   Social History  . Marital Status: Divorced    Spouse Name: N/A  . Number of Children: N/A  . Years of Education: N/A   Social History Main Topics  . Smoking status: Never Smoker   . Smokeless tobacco: Never Used  . Alcohol Use: 0.6 oz/week    1 Standard drinks or equivalent per week     Comment: once weekly  . Drug Use: No  . Sexual Activity: Not Currently   Other Topics Concern  . None   Social History Narrative    Family History  Problem Relation Age of Onset  . Stroke Mother   . Nephrolithiasis Mother   . Diabetes Mother   . Aneurysm Mother     x2  . Cancer Mother     lymphoma, breast cancer  . Depression Father   . Parkinsonism Father   . Drug abuse Father   . Mental illness Sister     paranoia    Review of Systems     Objective:   Filed Vitals:   10/12/15 1617  BP: 114/84  Pulse: 73  Temp: 98 F (36.7 C)  Resp: 16   Filed Weights   10/12/15 1617  Weight: 138 lb (62.596 kg)   Body mass index is 22.28 kg/(m^2).   Physical Exam      Assessment & Plan:   See Problem List for Assessment and Plan of chronic medical problems.  20 minutes were spent face-to-face with the patient, over 50% of which was spent counseling regarding osteoporosis and treatment options, including forteo, prolia, fosmax and reclast.  She will think about these options and let me know if she wants to try one.

## 2015-10-14 ENCOUNTER — Encounter: Payer: Self-pay | Admitting: Internal Medicine

## 2015-10-16 ENCOUNTER — Encounter: Payer: Self-pay | Admitting: Internal Medicine

## 2015-10-16 DIAGNOSIS — E0590 Thyrotoxicosis, unspecified without thyrotoxic crisis or storm: Secondary | ICD-10-CM

## 2015-10-18 MED ORDER — ALENDRONATE SODIUM 70 MG PO TABS: 70.0000 mg | ORAL_TABLET | ORAL | Status: DC

## 2015-10-23 NOTE — Progress Notes (Signed)
Cardiology Office Note Date:  10/25/2015  Patient ID:  Jameerah, Beber 1951/03/03, MRN [id number] PCP:  Binnie Rail, MD  Cardiologist:  Dr. Caryl Comes   Chief Complaint: palpitations  History of Present Illness: Briar Bains is a 65 y.o. female with history of hyperthyroid disease, and prior c/o palpitations comes to the office to be seen for Dr. Caryl Comes.  She last saw him June 2016 at that time doing well with her palpitations resolved at that point.  She is feeling generally ok, though in the last 5-6 months feels like her HR reacts inappropriately.  At rest generally in the 80's, then when she stands and walks increases by 20 or so, walking her usual 2 miles at a "normal" pace might get her HR 120's and has her concerned.  She has begun to wear a fit bit to monitor this and seems that this is the trend she has observed it notes spikes in her HR at night as well.  She is not symptomatic or woken with them.  She denies CP but feels when her HR shoots up to 110-120 she is breathless and can tell her HR is up.  No near syncope or syncope.  She suspects despite being told her labs were OK that her thyroid function is not right and may need medication adjustment, her endocrinologist is no longer practicing and is pending to find a new one, her PMD has reorted to her her labs looked normal.  Her PMG had her wear an EM and told her her HR got to 151 and should f/u here, possible suggestion of betablocker initiation.   Past Medical History  Diagnosis Date  . Menopause   . Osteoporosis   . Gall bladder disease     per pt report  . GERD (gastroesophageal reflux disease)   . Hemorrhoids   . Arthritis   . Acetabular labrum tear     right/ per pt history  . Thyroid disease     Past Surgical History  Procedure Laterality Date  . Nasal sinus surgery  1995, 2002  . Cystectomy  1999    Right elbow  . Breast surgery  2002    cyst on Left breast  . Total hip arthroplasty  09/12/2011     Procedure: TOTAL HIP ARTHROPLASTY ANTERIOR APPROACH;  Surgeon: Mauri Pole, MD;  Location: [facility];  Service: Orthopedics;  Laterality: Left;  . Joint replacement    . Knee arthroscopy Left 09/10/2013    Procedure: LEFT MEDIAL AND LATERAL KNEE ARTHROSCOPY WITH MENISCAL DEBRIDEMENT ;  Surgeon: Gearlean Alf, MD;  Location: [facility];  Service: Orthopedics;  Laterality: Left;  . Hemorrhoid banding  07/2014    Current Outpatient Prescriptions  Medication Sig Dispense Refill  . alendronate (FOSAMAX) 70 MG tablet Take 1 tablet (70 mg total) by mouth every 7 (seven) days. Take with a full glass of water on an empty stomach. 4 tablet 11  . Amino Acids (AMINO ACID PO) Take 1 tablet by mouth 2 (two) times daily.    Marland Kitchen b complex vitamins capsule Take 1 capsule by mouth daily.    . Betaine HCl 300 MG TABS Take 300 mg by mouth daily.    . Biotin 1 MG CAPS Take by mouth.    . cetirizine (ZYRTEC) 10 MG tablet Take 10 mg by mouth daily.    . Cholecalciferol (CVS VIT D 5000 HIGH-POTENCY PO) Take 5,000 Units by mouth daily.    Marland Kitchen DHA-PHOSPHATIDYLSERINE  PO Take 100 mg by mouth 2 (two) times daily.    Marland Kitchen EVENING PRIMROSE OIL PO Take by mouth.    . Famotidine (PEPCID PO) Take 10 mg by mouth 2 (two) times daily.    Marland Kitchen glucosamine-chondroitin (GLUCOSAMINE-CHONDROITIN DS) 500-400 MG tablet Take by mouth.    Marland Kitchen ketoconazole (NIZORAL) 2 % shampoo   99  . Magnesium 500 MG CAPS Take 500 mg by mouth daily.    . methimazole (TAPAZOLE) 5 MG tablet Take 2.5 mg by mouth daily.  11  . Omega-3 Fatty Acids (OMEGA 3 PO) Take 1 capsule by mouth 2 (two) times daily.    . Potassium 99 MG TABS Take 99 mg by mouth daily.    Marland Kitchen PREMARIN vaginal cream   4  . RESTORA RX 60-1.25 MG CAPS Take 1 tablet by mouth daily.  11  . Theanine 100 MG CAPS Take 100 mg by mouth 2 (two) times daily.    . valACYclovir (VALTREX) 500 MG tablet Take 1 tablet (500 mg total) by mouth daily. 90 tablet 1  . vitamin C (ASCORBIC ACID) 500 MG tablet Take 500  mg by mouth daily.    Marland Kitchen zinc gluconate 50 MG tablet Take 50 mg by mouth daily.     No current facility-administered medications for this visit.    Allergies:   Histamine   Social History:  The patient  reports that she has never smoked. She has never used smokeless tobacco. She reports that she drinks about 0.6 oz of alcohol per week. She reports that she does not use illicit drugs.   Family History:  The patient's family history includes Aneurysm in her mother; Cancer in her mother; Depression in her father; Diabetes in her mother; Drug abuse in her father; Mental illness in her sister; Nephrolithiasis in her mother; Parkinsonism in her father; Stroke in her mother.  ROS:  Please see the history of present illness.  All other systems are reviewed and otherwise negative.   PHYSICAL EXAM:  VS:  BP 96/70 mmHg  Pulse 75  Ht 5\' 7"  (1.702 m)  Wt 140 lb 12.8 oz (63.866 kg)  BMI 22.05 kg/m2 BMI: Body mass index is 22.05 kg/(m^2). Well nourished, well developed, in no acute distress HEENT: normocephalic, atraumatic Neck: no JVD, carotid bruits or masses Cardiac:  normal S1, S2; RRR; no significant murmurs, no rubs, or gallops Lungs:  clear to auscultation bilaterally, no wheezing, rhonchi or rales Abd: soft, nontender MS: no deformity or atrophy Ext: no edema Skin: warm and dry, no rash Neuro:  No gross deficits appreciated Psych: euthymic mood, full affect    EKG:  Done today and reviewed by myself shows SR Event Monitor April-May 2017: Study Highlights     Sinus bradycardia and normal sinus rhythm with heart rate ranging from 57-151 bpm.  Nonsustained atrial tachcyardia up to 151 bpm   (I reviewed the strip today  Atach, lasted approx 6 seconds.)   10/22/14: Echocardiogram Study Conclusions - Left ventricle: There is a false tendon in the LV apex of no  clinical significance. The cavity size was normal. Systolic  function was normal. Wall motion was normal; there were  no  regional wall motion abnormalities. Features are consistent with  a pseudonormal left ventricular filling pattern, with concomitant  abnormal relaxation and increased filling pressure (grade 2  diastolic dysfunction). - Aortic valve: Trileaflet; mildly thickened, mildly calcified  leaflets. - Mitral valve: Trivial, late systolicsystolic bowing without  prolapse, involving the anterior leaflet. -  Tricuspid valve: There was moderate regurgitation.  Recent Labs: 05/04/2015: ALT 16; ALT 16; BUN 16; Creatinine, Ser 0.78; Hemoglobin 13.7; Platelets 276.0; Potassium 4.6; Sodium 141 08/05/2015: TSH 2.28  No results found for requested labs within last 365 days.   CrCl cannot be calculated (Patient has no serum creatinine result on file.).   Wt Readings from Last 3 Encounters:  10/25/15 140 lb 12.8 oz (63.866 kg)  10/12/15 138 lb (62.596 kg)  08/05/15 138 lb (62.596 kg)     Other studies reviewed: Additional studies/records reviewed today include: summarized above  ASSESSMENT AND PLAN:  1. Palpitations as described     Lengthy discussion today, she has suspect sleep apnea, states she will wake with a choking type feeling, wakes tired and knows she is not sleeping well.  Pending sleep study in August.  She alsos supects that her thyroid is off and needs a more in depth evaluation  Disposition: F/u with endocrinology and her sleep study, her baseline BP leaves little room for BB and would not start with that just yet.  WE will see her back in 3 monthsh after she has had the other evaluations completed and re-evaluate further.  Current medicines are reviewed at length with the patient today.  The patient did not have any concerns regarding medicines.  Haywood Lasso, PA-C 10/25/2015 9:24 AM     [address] [place] [id number] [phone] (office)  [phone] (fax)

## 2015-10-25 ENCOUNTER — Encounter: Payer: Self-pay | Admitting: Physician Assistant

## 2015-10-25 ENCOUNTER — Ambulatory Visit (INDEPENDENT_AMBULATORY_CARE_PROVIDER_SITE_OTHER): Payer: BLUE CROSS/BLUE SHIELD | Admitting: Physician Assistant

## 2015-10-25 VITALS — BP 96/70 | HR 75 | Ht 67.0 in | Wt 140.8 lb

## 2015-10-25 DIAGNOSIS — R002 Palpitations: Secondary | ICD-10-CM

## 2015-10-25 DIAGNOSIS — R0602 Shortness of breath: Secondary | ICD-10-CM

## 2015-10-25 NOTE — Patient Instructions (Addendum)
  Medication Instructions:   Your physician recommends that you continue on your current medications as directed. Please refer to the Current Medication list given to you today.   If you need a refill on your cardiac medications before your next appointment, please call your pharmacy.  Labwork: NONE ORDER TODAY    Testing/Procedures: NONE ORDER TODAY    Follow-Up:  IN 3 MONTHS WITH RENEE    Any Other Special Instructions Will Be Listed Below (If Applicable).

## 2015-11-05 ENCOUNTER — Telehealth: Payer: Self-pay | Admitting: Internal Medicine

## 2015-11-05 NOTE — Telephone Encounter (Signed)
Please advise 

## 2015-11-05 NOTE — Telephone Encounter (Signed)
Spoke with pt, appt scheduled for Sat 7/15

## 2015-11-05 NOTE — Telephone Encounter (Signed)
Patient Name: Patricia Conley  DOB: 03-31-1951    Initial Comment caller states she has a red spot with black in the middle   Nurse Assessment  Nurse: Raphael Gibney, RN, Vanita Ingles Date/Time (Eastern Time): 11/05/2015 9:39:57 AM  Confirm and document reason for call. If symptomatic, describe symptoms. You must click the next button to save text entered. ---Caller states she has a red spot with black dot in the middle. redness about the size of the end of her pinky finger. It is on her upper thigh. Noticed it this am. Not sure what caused the area. thinks it might have been a tick. no fever.  Has the patient traveled out of the country within the last 30 days? ---Not Applicable  Does the patient have any new or worsening symptoms? ---Yes  Will a triage be completed? ---Yes  Related visit to physician within the last 2 weeks? ---No  Does the PT have any chronic conditions? (i.e. diabetes, asthma, etc.) ---Yes  List chronic conditions. ---autoimmune disorder; hypothyroidism  Is this a behavioral health or substance abuse call? ---No     Guidelines    Guideline Title Affirmed Question Affirmed Notes  Tick Bite Tick bite with no complications    Final Disposition [place], RN, Vanita Ingles    Comments  Pt is requesting antibiotics to prevent lyme disease. Please call pt back regarding medication.   Disagree/Comply: Comply

## 2015-11-05 NOTE — Telephone Encounter (Signed)
It might be good to have someone look at it - just to make sure that is what it is -- she can be schedule for tomorrow's clinic

## 2015-11-06 ENCOUNTER — Encounter: Payer: Self-pay | Admitting: Family Medicine

## 2015-11-06 ENCOUNTER — Ambulatory Visit (INDEPENDENT_AMBULATORY_CARE_PROVIDER_SITE_OTHER): Payer: BLUE CROSS/BLUE SHIELD | Admitting: Family Medicine

## 2015-11-06 VITALS — BP 108/64 | HR 86 | Temp 98.1°F | Resp 12 | Wt 139.8 lb

## 2015-11-06 DIAGNOSIS — W57XXXA Bitten or stung by nonvenomous insect and other nonvenomous arthropods, initial encounter: Secondary | ICD-10-CM

## 2015-11-06 DIAGNOSIS — R21 Rash and other nonspecific skin eruption: Secondary | ICD-10-CM | POA: Insufficient documentation

## 2015-11-06 DIAGNOSIS — T148 Other injury of unspecified body region: Secondary | ICD-10-CM

## 2015-11-06 MED ORDER — DOXYCYCLINE HYCLATE 100 MG PO TABS: 100.0000 mg | ORAL_TABLET | Freq: Two times a day (BID) | ORAL | Status: DC

## 2015-11-06 NOTE — Progress Notes (Signed)
Pre visit review using our clinic review tool, if applicable. No additional management support is needed unless otherwise documented below in the visit note. 

## 2015-11-06 NOTE — Assessment & Plan Note (Signed)
She removed an attached black speck from her anterior upper right thigh earlier in the week but did not have her glasses on at the time. She thinks it was a tick. It is now a small 1-2 mm red spot without surrounding fluctuance. Discussed concerning symptoms and ways to minimize tick exposure in future. She is given a prescription to take of Doxy if her symptoms worsen but is encouraged not to take it at this time. An order is also placed for later this week to test for lyme, rmsf and ehrlichia if she develops symptoms or becomes concerned

## 2015-11-06 NOTE — Progress Notes (Signed)
Patient ID: Patricia Conley, female   DOB: 1950/12/11, 65 y.o.   MRN: [id number]   Subjective:    Patient ID: Patricia Conley, female    DOB: 1950/11/05, 65 y.o.   MRN: [id number]  Chief Complaint  Patient presents with  . Check spot on right upper thigh    HPI Patient is in today for evaluation of a tick bite. She feels well today but several days ago removed an affixed black speck from her anterior right thigh. She thinks it was a tick but did not have on her glasses. She denies HA/f/c/malaise or myalgias. She is leaving town and is just worried symptoms may worsen.  Past Medical History  Diagnosis Date  . Menopause   . Osteoporosis   . Gall bladder disease     per pt report  . GERD (gastroesophageal reflux disease)   . Hemorrhoids   . Arthritis   . Acetabular labrum tear     right/ per pt history  . Thyroid disease     Past Surgical History  Procedure Laterality Date  . Nasal sinus surgery  1995, 2002  . Cystectomy  1999    Right elbow  . Breast surgery  2002    cyst on Left breast  . Total hip arthroplasty  09/12/2011    Procedure: TOTAL HIP ARTHROPLASTY ANTERIOR APPROACH;  Surgeon: Mauri Pole, MD;  Location: [facility];  Service: Orthopedics;  Laterality: Left;  . Joint replacement    . Knee arthroscopy Left 09/10/2013    Procedure: LEFT MEDIAL AND LATERAL KNEE ARTHROSCOPY WITH MENISCAL DEBRIDEMENT ;  Surgeon: Gearlean Alf, MD;  Location: [facility];  Service: Orthopedics;  Laterality: Left;  . Hemorrhoid banding  07/2014    Family History  Problem Relation Age of Onset  . Stroke Mother   . Nephrolithiasis Mother   . Diabetes Mother   . Aneurysm Mother     x2  . Cancer Mother     lymphoma, breast cancer  . Depression Father   . Parkinsonism Father   . Drug abuse Father   . Mental illness Sister     paranoia    Social History   Social History  . Marital Status: Divorced    Spouse Name: N/A  . Number of Children: N/A  . Years of Education: N/A    Occupational History  . Not on file.   Social History Main Topics  . Smoking status: Never Smoker   . Smokeless tobacco: Never Used  . Alcohol Use: 0.6 oz/week    1 Standard drinks or equivalent per week     Comment: once weekly  . Drug Use: No  . Sexual Activity: Not Currently   Other Topics Concern  . Not on file   Social History Narrative    Outpatient Prescriptions Prior to Visit  Medication Sig Dispense Refill  . alendronate (FOSAMAX) 70 MG tablet Take 1 tablet (70 mg total) by mouth every 7 (seven) days. Take with a full glass of water on an empty stomach. 4 tablet 11  . Amino Acids (AMINO ACID PO) Take 1 tablet by mouth 2 (two) times daily.    Marland Kitchen b complex vitamins capsule Take 1 capsule by mouth daily.    . Betaine HCl 300 MG TABS Take 300 mg by mouth daily.    . Biotin 1 MG CAPS Take by mouth.    . cetirizine (ZYRTEC) 10 MG tablet Take 10 mg by mouth daily.    . Cholecalciferol (  CVS VIT D 5000 HIGH-POTENCY PO) Take 5,000 Units by mouth daily.    Marland Kitchen DHA-PHOSPHATIDYLSERINE PO Take 100 mg by mouth 2 (two) times daily.    Marland Kitchen EVENING PRIMROSE OIL PO Take by mouth.    . Famotidine (PEPCID PO) Take 10 mg by mouth 2 (two) times daily.    Marland Kitchen glucosamine-chondroitin (GLUCOSAMINE-CHONDROITIN DS) 500-400 MG tablet Take by mouth.    Marland Kitchen ketoconazole (NIZORAL) 2 % shampoo   99  . Magnesium 500 MG CAPS Take 500 mg by mouth daily.    . methimazole (TAPAZOLE) 5 MG tablet Take 2.5 mg by mouth daily.  11  . Omega-3 Fatty Acids (OMEGA 3 PO) Take 1 capsule by mouth 2 (two) times daily.    . Potassium 99 MG TABS Take 99 mg by mouth daily.    Marland Kitchen PREMARIN vaginal cream   4  . RESTORA RX 60-1.25 MG CAPS Take 1 tablet by mouth daily.  11  . Theanine 100 MG CAPS Take 100 mg by mouth 2 (two) times daily.    . valACYclovir (VALTREX) 500 MG tablet Take 1 tablet (500 mg total) by mouth daily. 90 tablet 1  . vitamin C (ASCORBIC ACID) 500 MG tablet Take 500 mg by mouth daily.    Marland Kitchen zinc gluconate 50 MG  tablet Take 50 mg by mouth daily.     No facility-administered medications prior to visit.    Allergies  Allergen Reactions  . Histamine Rash    Flushing    Review of Systems  Constitutional: Negative for fever and malaise/fatigue.  HENT: Negative for congestion.   Eyes: Negative for blurred vision.  Respiratory: Negative for shortness of breath.   Cardiovascular: Negative for chest pain, palpitations and leg swelling.  Gastrointestinal: Negative for nausea, abdominal pain and blood in stool.  Genitourinary: Negative for dysuria and frequency.  Musculoskeletal: Negative for falls.  Skin: Positive for rash.  Neurological: Negative for dizziness, loss of consciousness and headaches.  Endo/Heme/Allergies: Negative for environmental allergies.  Psychiatric/Behavioral: Negative for depression. The patient is not nervous/anxious.        Objective:    Physical Exam  Constitutional: She is oriented to person, place, and time. She appears well-developed and well-nourished. No distress.  HENT:  Head: Normocephalic and atraumatic.  Nose: Nose normal.  Eyes: Right eye exhibits no discharge. Left eye exhibits no discharge.  Neck: Normal range of motion. Neck supple.  Cardiovascular: Normal rate and regular rhythm.   No murmur heard. Pulmonary/Chest: Effort normal and breath sounds normal.  Abdominal: Soft. Bowel sounds are normal. There is no tenderness.  Musculoskeletal: She exhibits no edema.  Neurological: She is alert and oriented to person, place, and time.  Skin: Skin is warm and dry. Rash noted.  1-2 mm red lesion, scab in center upper anterior right thigh  Psychiatric: She has a normal mood and affect.  Nursing note and vitals reviewed.   BP 108/64 mmHg  Pulse 86  Temp(Src) 98.1 F (36.7 C) (Oral)  Resp 12  Wt 139 lb 12 oz (63.39 kg)  SpO2 96% Wt Readings from Last 3 Encounters:  11/06/15 139 lb 12 oz (63.39 kg)  10/25/15 140 lb 12.8 oz (63.866 kg)  10/12/15 138  lb (62.596 kg)     Lab Results  Component Value Date   WBC 7.5 05/04/2015   HGB 13.7 05/04/2015   HCT 42.1 05/04/2015   PLT 276.0 05/04/2015   GLUCOSE 94 05/04/2015   CHOL 161 08/17/2014   TRIG 81  08/17/2014   HDL 74 08/17/2014   LDLCALC 71 08/17/2014   ALT 16 05/04/2015   ALT 16 05/04/2015   AST 22 05/04/2015   AST 22 05/04/2015   NA 141 05/04/2015   K 4.6 05/04/2015   CL 102 05/04/2015   CREATININE 0.78 05/04/2015   BUN 16 05/04/2015   CO2 31 05/04/2015   TSH 2.28 08/05/2015   INR 0.98 11/20/2012    Lab Results  Component Value Date   TSH 2.28 08/05/2015   Lab Results  Component Value Date   WBC 7.5 05/04/2015   HGB 13.7 05/04/2015   HCT 42.1 05/04/2015   MCV 88.8 05/04/2015   PLT 276.0 05/04/2015   Lab Results  Component Value Date   NA 141 05/04/2015   K 4.6 05/04/2015   CO2 31 05/04/2015   GLUCOSE 94 05/04/2015   BUN 16 05/04/2015   CREATININE 0.78 05/04/2015   BILITOT 0.9 05/04/2015   BILITOT 0.9 05/04/2015   ALKPHOS 68 05/04/2015   ALKPHOS 68 05/04/2015   AST 22 05/04/2015   AST 22 05/04/2015   ALT 16 05/04/2015   ALT 16 05/04/2015   PROT 7.7 05/04/2015   PROT 7.7 05/04/2015   ALBUMIN 5.0 05/04/2015   ALBUMIN 5.0 05/04/2015   CALCIUM 10.3 05/04/2015   ANIONGAP 5 09/26/2014   GFR 79.49 05/04/2015   Lab Results  Component Value Date   CHOL 161 08/17/2014   Lab Results  Component Value Date   HDL 74 08/17/2014   Lab Results  Component Value Date   LDLCALC 71 08/17/2014   Lab Results  Component Value Date   TRIG 81 08/17/2014   Lab Results  Component Value Date   CHOLHDL 2.2 08/17/2014   No results found for: HGBA1C     Assessment & Plan:   Problem List Items Addressed This Visit    Rash and nonspecific skin eruption    She removed an attached black speck from her anterior upper right thigh earlier in the week but did not have her glasses on at the time. She thinks it was a tick. It is now a small 1-2 mm red spot without  surrounding fluctuance. Discussed concerning symptoms and ways to minimize tick exposure in future. She is given a prescription to take of Doxy if her symptoms worsen but is encouraged not to take it at this time. An order is also placed for later this week to test for lyme, rmsf and ehrlichia if she develops symptoms or becomes concerned      Relevant Medications   doxycycline (VIBRA-TABS) 100 MG tablet   Other Relevant Orders   Lyme Ab/Western Blot Reflex   Rocky mtn spotted fvr ab, IgM-blood   Ehrlichia antibody panel    Other Visit Diagnoses    Tick bite    -  Primary    Relevant Medications    doxycycline (VIBRA-TABS) 100 MG tablet    Other Relevant Orders    Lyme Ab/Western Blot Reflex    Rocky mtn spotted fvr ab, IgM-blood    Ehrlichia antibody panel       I am having Ms. Omdahl start on doxycycline. I am also having her maintain her Magnesium, vitamin C, Potassium, b complex vitamins, Cholecalciferol (CVS VIT D 5000 HIGH-POTENCY PO), Amino Acids (AMINO ACID PO), Omega-3 Fatty Acids (OMEGA 3 PO), zinc gluconate, EVENING PRIMROSE OIL PO, Biotin, Theanine, DHA-PHOSPHATIDYLSERINE PO, Betaine HCl, PREMARIN, methimazole, ketoconazole, cetirizine, glucosamine-chondroitin, Famotidine (PEPCID PO), valACYclovir, alendronate, and RESTORA RX.  Meds  ordered this encounter  Medications  . doxycycline (VIBRA-TABS) 100 MG tablet    Sig: Take 1 tablet (100 mg total) by mouth 2 (two) times daily.    Dispense:  20 tablet    Refill:  0     Penni Homans, MD

## 2015-11-06 NOTE — Patient Instructions (Addendum)
[email or web address] Lemon Eucalyptus  Tick Bite Information Ticks are insects that attach themselves to the skin and draw blood for food. There are various types of ticks. Common types include wood ticks and deer ticks. Most ticks live in shrubs and grassy areas. Ticks can climb onto your body when you make contact with leaves or grass where the tick is waiting. The most common places on the body for ticks to attach themselves are the scalp, neck, armpits, waist, and groin. Most tick bites are harmless, but sometimes ticks carry germs that cause diseases. These germs can be spread to a person during the tick's feeding process. The chance of a disease spreading through a tick bite depends on:   The type of tick.  Time of year.   How long the tick is attached.   Geographic location.  HOW CAN YOU PREVENT TICK BITES? Take these steps to help prevent tick bites when you are outdoors:  Wear protective clothing. Long sleeves and long pants are best.   Wear white clothes so you can see ticks more easily.  Tuck your pant legs into your socks.   If walking on a trail, stay in the middle of the trail to avoid brushing against bushes.  Avoid walking through areas with long grass.  Put insect repellent on all exposed skin and along boot tops, pant legs, and sleeve cuffs.   Check clothing, hair, and skin repeatedly and before going inside.   Brush off any ticks that are not attached.  Take a shower or bath as soon as possible after being outdoors.  WHAT IS THE PROPER WAY TO REMOVE A TICK? Ticks should be removed as soon as possible to help prevent diseases caused by tick bites. 1. If latex gloves are available, put them on before trying to remove a tick.  2. Using fine-point tweezers, grasp the tick as close to the skin as possible. You may also use curved forceps or a tick removal tool. Grasp the tick as close to its head as possible. Avoid grasping the tick on its body. 3. Pull  gently with steady upward pressure until the tick lets go. Do not twist the tick or jerk it suddenly. This may break off the tick's head or mouth parts. 4. Do not squeeze or crush the tick's body. This could force disease-carrying fluids from the tick into your body.  5. After the tick is removed, wash the bite area and your hands with soap and water or other disinfectant such as alcohol. 6. Apply a small amount of antiseptic cream or ointment to the bite site.  7. Wash and disinfect any instruments that were used.  Do not try to remove a tick by applying a hot match, petroleum jelly, or fingernail polish to the tick. These methods do not work and may increase the chances of disease being spread from the tick bite.  WHEN SHOULD YOU SEEK MEDICAL CARE? Contact your health care provider if you are unable to remove a tick from your skin or if a part of the tick breaks off and is stuck in the skin.  After a tick bite, you need to be aware of signs and symptoms that could be related to diseases spread by ticks. Contact your health care provider if you develop any of the following in the days or weeks after the tick bite:  Unexplained fever.  Rash. A circular rash that appears days or weeks after the tick bite may indicate the possibility of Lyme disease.  The rash may resemble a target with a bull's-eye and may occur at a different part of your body than the tick bite.  Redness and swelling in the area of the tick bite.   Tender, swollen lymph glands.   Diarrhea.   Weight loss.   Cough.   Fatigue.   Muscle, joint, or bone pain.   Abdominal pain.   Headache.   Lethargy or a change in your level of consciousness.  Difficulty walking or moving your legs.   Numbness in the legs.   Paralysis.  Shortness of breath.   Confusion.   Repeated vomiting.    This information is not intended to replace advice given to you by your health care provider. Make sure you discuss  any questions you have with your health care provider.   Document Released: 04/07/2000 Document Revised: 05/01/2014 Document Reviewed: 09/18/2012 Elsevier Interactive Patient Education [employer].

## 2015-11-10 ENCOUNTER — Other Ambulatory Visit: Payer: BLUE CROSS/BLUE SHIELD

## 2015-11-10 DIAGNOSIS — W57XXXA Bitten or stung by nonvenomous insect and other nonvenomous arthropods, initial encounter: Secondary | ICD-10-CM

## 2015-11-10 DIAGNOSIS — R21 Rash and other nonspecific skin eruption: Secondary | ICD-10-CM

## 2015-11-11 ENCOUNTER — Encounter: Payer: Self-pay | Admitting: Internal Medicine

## 2015-11-11 DIAGNOSIS — E0590 Thyrotoxicosis, unspecified without thyrotoxic crisis or storm: Secondary | ICD-10-CM

## 2015-11-11 LAB — LYME AB/WESTERN BLOT REFLEX

## 2015-11-12 ENCOUNTER — Institutional Professional Consult (permissible substitution): Payer: BLUE CROSS/BLUE SHIELD | Admitting: Pulmonary Disease

## 2015-11-12 ENCOUNTER — Other Ambulatory Visit: Payer: Self-pay | Admitting: General Surgery

## 2015-11-12 DIAGNOSIS — R160 Hepatomegaly, not elsewhere classified: Secondary | ICD-10-CM

## 2015-11-15 ENCOUNTER — Ambulatory Visit
Admission: RE | Admit: 2015-11-15 | Discharge: 2015-11-15 | Disposition: A | Discharge status: Home / Self Care | Payer: BLUE CROSS/BLUE SHIELD | Source: Ambulatory Visit | Attending: General Surgery | Admitting: General Surgery

## 2015-11-15 DIAGNOSIS — R160 Hepatomegaly, not elsewhere classified: Secondary | ICD-10-CM

## 2015-11-15 MED ORDER — GADOBENATE DIMEGLUMINE 529 MG/ML IV SOLN
12.0000 mL | Freq: Once | INTRAVENOUS | Status: AC | PRN
  Administered 2015-11-15: 12 mL via INTRAVENOUS

## 2015-11-16 ENCOUNTER — Other Ambulatory Visit (INDEPENDENT_AMBULATORY_CARE_PROVIDER_SITE_OTHER): Payer: BLUE CROSS/BLUE SHIELD

## 2015-11-16 ENCOUNTER — Encounter: Payer: Self-pay | Admitting: Internal Medicine

## 2015-11-16 DIAGNOSIS — E0590 Thyrotoxicosis, unspecified without thyrotoxic crisis or storm: Secondary | ICD-10-CM

## 2015-11-16 LAB — TSH: TSH: 1.12 u[IU]/mL (ref 0.35–4.50)

## 2015-11-16 LAB — EHRLICHIA ANTIBODY PANEL: E chaffeensis (HGE) Ab, IgG: <1:64 {titer}

## 2015-11-16 LAB — T4, FREE: Free T4: 0.75 ng/dL (ref 0.60–1.60)

## 2015-11-16 LAB — T3, FREE: T3 FREE: 3.4 pg/mL (ref 2.3–4.2)

## 2015-11-16 NOTE — Progress Notes (Signed)
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  There are no new masses.  I am happy to see her back to discuss.

## 2015-12-16 ENCOUNTER — Ambulatory Visit (INDEPENDENT_AMBULATORY_CARE_PROVIDER_SITE_OTHER): Payer: BLUE CROSS/BLUE SHIELD | Admitting: Pulmonary Disease

## 2015-12-16 ENCOUNTER — Encounter: Payer: Self-pay | Admitting: Pulmonary Disease

## 2015-12-16 VITALS — BP 102/62 | HR 77 | Ht 67.0 in | Wt 138.2 lb

## 2015-12-16 DIAGNOSIS — R0683 Snoring: Secondary | ICD-10-CM

## 2015-12-16 NOTE — Progress Notes (Signed)
   Subjective:    Patient ID: Patricia Conley, female    DOB: 09-09-1950, 65 y.o.   MRN: [id number]  HPI    Review of Systems     Objective:   Physical Exam        Assessment & Plan:

## 2015-12-16 NOTE — Progress Notes (Signed)
Past Surgical History She  has a past surgical history that includes Nasal sinus surgery (1995, 2002); Cystectomy (1999); Breast surgery (2002); Total hip arthroplasty (09/12/2011); Joint replacement; Knee arthroscopy (Left, 09/10/2013); and Hemorrhoid banding (07/2014).  Allergies  Allergen Reactions  . Histamine Rash    Flushing    Family History Her family history includes Aneurysm in her mother; Cancer in her mother; Depression in her father; Diabetes in her mother; Drug abuse in her father; Mental illness in her sister; Nephrolithiasis in her mother; Parkinsonism in her father; Stroke in her mother.  Social History She  reports that she has never smoked. She has never used smokeless tobacco. She reports that she drinks about 0.6 oz of alcohol per week . She reports that she does not use drugs.  Review of systems Constitutional: Negative for fever and unexpected weight change.  HENT: Negative for congestion, dental problem, ear pain, nosebleeds, postnasal drip, rhinorrhea, sinus pressure, sneezing, sore throat and trouble swallowing.   Eyes: Negative for redness and itching.  Respiratory: Positive for shortness of breath. Negative for cough, chest tightness and wheezing.   Cardiovascular: Positive for palpitations. Negative for leg swelling.  Gastrointestinal: Negative for nausea and vomiting.       Indigestion  Genitourinary: Negative for dysuria.  Musculoskeletal: Positive for arthralgias. Negative for joint swelling.  Skin: Negative for rash.  Neurological: Negative for headaches.  Hematological: Does not bruise/bleed easily.  Psychiatric/Behavioral: Negative for dysphoric mood. The patient is not nervous/anxious.     Current Outpatient Prescriptions on File Prior to Visit  Medication Sig  . alendronate (FOSAMAX) 70 MG tablet Take 1 tablet (70 mg total) by mouth every 7 (seven) days. Take with a full glass of water on an empty stomach.  . Amino Acids (AMINO ACID PO) Take 1  tablet by mouth 2 (two) times daily.  Marland Kitchen b complex vitamins capsule Take 1 capsule by mouth daily.  . Betaine HCl 300 MG TABS Take 300 mg by mouth daily.  . Biotin 1 MG CAPS Take by mouth.  . cetirizine (ZYRTEC) 10 MG tablet Take 10 mg by mouth daily.  . Cholecalciferol (CVS VIT D 5000 HIGH-POTENCY PO) Take 5,000 Units by mouth daily.  Marland Kitchen DHA-PHOSPHATIDYLSERINE PO Take 100 mg by mouth 2 (two) times daily.  Marland Kitchen EVENING PRIMROSE OIL PO Take by mouth.  . Famotidine (PEPCID PO) Take 10 mg by mouth 2 (two) times daily.  Marland Kitchen glucosamine-chondroitin (GLUCOSAMINE-CHONDROITIN DS) 500-400 MG tablet Take by mouth.  Marland Kitchen ketoconazole (NIZORAL) 2 % shampoo   . Magnesium 500 MG CAPS Take 500 mg by mouth daily.  . methimazole (TAPAZOLE) 5 MG tablet Take 2.5 mg by mouth daily.  . Omega-3 Fatty Acids (OMEGA 3 PO) Take 1 capsule by mouth 2 (two) times daily.  . Potassium 99 MG TABS Take 99 mg by mouth daily.  Marland Kitchen PREMARIN vaginal cream   . RESTORA RX 60-1.25 MG CAPS Take 1 tablet by mouth daily.  . Theanine 100 MG CAPS Take 100 mg by mouth 2 (two) times daily.  . valACYclovir (VALTREX) 500 MG tablet Take 1 tablet (500 mg total) by mouth daily.  . vitamin C (ASCORBIC ACID) 500 MG tablet Take 500 mg by mouth daily.  Marland Kitchen zinc gluconate 50 MG tablet Take 50 mg by mouth daily.   No current facility-administered medications on file prior to visit.     Chief Complaint  Patient presents with  . Sleep Consult    Referred by Dr Quay Burow. Sleep study about  7-8 years ago. Epworth Score: 14    Cardiac tests Echo 10/22/14 >> grade 2 diastolic CHF, mod TR  Past medical history She  has a past medical history of Acetabular labrum tear; Arthritis; Gall bladder disease; GERD (gastroesophageal reflux disease); Hemorrhoids; Menopause; Osteoporosis; and Thyroid disease.  Vital signs BP 102/62 (BP Location: Left Arm, Cuff Size: Normal)   Pulse 77   Ht 5\' 7"  (1.702 m)   Wt 138 lb 3.2 oz (62.7 kg)   SpO2 95%   BMI 21.65 kg/m    History of Present Illness Patricia Conley is a 65 y.o. female for evaluation of sleep problems.  She has persistent trouble with daytime sleepiness.  She had sleep study about 8 yrs ago >> was told no sleep apnea.  Her sleep has gotten worse since then, especially after menopause several years ago.  She snores, and her family says she stops breathing while asleep.  She will wake up hearing herself snore, and making a choking noise.  This happens more on her back.  Her mouth gets dry at night.  Two of her brothers have sleep apnea.  She is convinced her father also had sleep apnea, but was never diagnosed.  She goes to sleep between 10 and 11 pm.  She falls asleep after 2 minutes.  She wakes up one time to use the bathroom.  She gets out of bed at 6 am.  She feels tired in the morning.  She denies morning headache.  She does not use anything to help her fall sleep or stay awake.  She denies sleep walking, sleep talking, bruxism, or nightmares.  There is no history of restless legs.  She denies sleep hallucinations, sleep paralysis, or cataplexy.  The Epworth score is 14 out of 24.   Physical Exam:  General - No distress ENT - No sinus tenderness, no oral exudate, no LAN, no thyromegaly, TM clear, pupils equal/reactive, MP 4, low laying soft palate Cardiac - s1s2 regular, no murmur, pulses symmetric Chest - No wheeze/rales/dullness, good air entry, normal respiratory excursion Back - No focal tenderness Abd - Soft, non-tender, no organomegaly, + bowel sounds Ext - No edema Neuro - Normal strength, cranial nerves intact Skin - No rashes Psych - Normal mood, and behavior  Discussion: She has snoring, sleep disruption, daytime sleepiness, and witnessed apnea.  I am concerned she could have developed sleep apnea.  We discussed how sleep apnea can affect various health problems, including risks for hypertension, cardiovascular disease, and diabetes.  We also discussed how sleep  disruption can increase risks for accidents, such as while driving.  Weight loss as a means of improving sleep apnea was also reviewed.  Additional treatment options discussed were CPAP therapy, oral appliance, and surgical intervention.  Assessment/plan:  Snoring with concern for obstructive sleep apnea. - will arrange for in lab sleep study to further assess   Patient Instructions  Will arrange for lab sleep study Will call to arrange for follow up after sleep study reviewed     Chesley Mires, M.D. Pager [phone] 12/16/2015, 11:31 AM

## 2015-12-16 NOTE — Patient Instructions (Signed)
Will arrange for lab sleep study Will call to arrange for follow up after sleep study reviewed

## 2015-12-16 NOTE — Progress Notes (Signed)
   Subjective:    Patient ID: Patricia Conley, female    DOB: [birth year], 65 y.o.   MRN: [id number]  HPI    Review of Systems  Constitutional: Negative for fever and unexpected weight change.  HENT: Negative for congestion, dental problem, ear pain, nosebleeds, postnasal drip, rhinorrhea, sinus pressure, sneezing, sore throat and trouble swallowing.   Eyes: Negative for redness and itching.  Respiratory: Positive for shortness of breath. Negative for cough, chest tightness and wheezing.   Cardiovascular: Positive for palpitations. Negative for leg swelling.  Gastrointestinal: Negative for nausea and vomiting.       Indigestion  Genitourinary: Negative for dysuria.  Musculoskeletal: Positive for arthralgias. Negative for joint swelling.  Skin: Negative for rash.  Neurological: Negative for headaches.  Hematological: Does not bruise/bleed easily.  Psychiatric/Behavioral: Negative for dysphoric mood. The patient is not nervous/anxious.        Objective:   Physical Exam        Assessment & Plan:

## 2015-12-26 ENCOUNTER — Ambulatory Visit (HOSPITAL_BASED_OUTPATIENT_CLINIC_OR_DEPARTMENT_OTHER): Payer: BLUE CROSS/BLUE SHIELD | Attending: Pulmonary Disease | Admitting: Pulmonary Disease

## 2015-12-26 VITALS — Ht 67.0 in | Wt 136.0 lb

## 2015-12-26 DIAGNOSIS — R0683 Snoring: Secondary | ICD-10-CM

## 2015-12-26 DIAGNOSIS — G4733 Obstructive sleep apnea (adult) (pediatric): Secondary | ICD-10-CM | POA: Insufficient documentation

## 2015-12-26 DIAGNOSIS — I10 Essential (primary) hypertension: Secondary | ICD-10-CM | POA: Insufficient documentation

## 2015-12-26 DIAGNOSIS — E669 Obesity, unspecified: Secondary | ICD-10-CM | POA: Insufficient documentation

## 2015-12-26 DIAGNOSIS — Z79899 Other long term (current) drug therapy: Secondary | ICD-10-CM | POA: Diagnosis not present

## 2015-12-26 DIAGNOSIS — Z6831 Body mass index (BMI) 31.0-31.9, adult: Secondary | ICD-10-CM | POA: Insufficient documentation

## 2015-12-26 DIAGNOSIS — R5383 Other fatigue: Secondary | ICD-10-CM | POA: Insufficient documentation

## 2015-12-28 ENCOUNTER — Telehealth: Payer: Self-pay | Admitting: Pulmonary Disease

## 2015-12-28 ENCOUNTER — Encounter: Payer: Self-pay | Admitting: Pulmonary Disease

## 2015-12-28 ENCOUNTER — Encounter (HOSPITAL_BASED_OUTPATIENT_CLINIC_OR_DEPARTMENT_OTHER): Payer: Self-pay | Admitting: Pulmonary Disease

## 2015-12-28 DIAGNOSIS — R0683 Snoring: Secondary | ICD-10-CM

## 2015-12-28 DIAGNOSIS — G4733 Obstructive sleep apnea (adult) (pediatric): Secondary | ICD-10-CM | POA: Insufficient documentation

## 2015-12-28 HISTORY — DX: Obstructive sleep apnea (adult) (pediatric): G47.33

## 2015-12-28 NOTE — Telephone Encounter (Signed)
PSG 12/26/15 >> AHI 0.7, SaO2 low 89%.   Will have my nurse inform pt that sleep study was negative for sleep apnea.  She can schedule appointment with me to discuss findings in more detail and review options to help with snoring.

## 2015-12-28 NOTE — Procedures (Addendum)
    Patient Name: Patricia Conley, Patricia Conley Date: 12/26/2015 Gender: Female D.O.B: 1950/08/23 Age (years): 60 Referring Provider: Chesley Mires MD, ABSM Height (inches): 67 Interpreting Physician: Chesley Mires MD, ABSM Weight (lbs): 136 RPSGT: Earney [place] BMI: 21 MRN: [id number] Neck Size: 13.00 CLINICAL INFORMATION Sleep Study Type: NPSG Indication for sleep study: Snoring Epworth Sleepiness Score: 11  SLEEP STUDY TECHNIQUE As per the AASM Manual for the Scoring of Sleep and Associated Events v2.3 (April 2016) with a hypopnea requiring 4% desaturations. The channels recorded and monitored were frontal, central and occipital EEG, electrooculogram (EOG), submentalis EMG (chin), nasal and oral airflow, thoracic and abdominal wall motion, anterior tibialis EMG, snore microphone, electrocardiogram, and pulse oximetry.  MEDICATIONS Patient's medications include: reviewed in electronic medical record. Medications self-administered by patient during sleep study : No sleep medicine administered.  SLEEP ARCHITECTURE The study was initiated at 9:35:06 PM and ended at 4:32:00 AM. Sleep onset time was 55.8 minutes and the sleep efficiency was 80.5%. The total sleep time was 335.6 minutes. Stage REM latency was 114.0 minutes. The patient spent 2.09% of the night in stage N1 sleep, 77.80% in stage N2 sleep, 1.04% in stage N3 and 19.07% in REM. Alpha intrusion was absent. Supine sleep was 41.57%.  RESPIRATORY PARAMETERS The overall apnea/hypopnea index (AHI) was 0.7 per hour. There were 2 total apneas, including 1 obstructive, 1 central and 0 mixed apneas. There were 2 hypopneas and 6 RERAs. The AHI during Stage REM sleep was 1.9 per hour. AHI while supine was 0.9 per hour. The mean oxygen saturation was 93.31%. The minimum SpO2 during sleep was 89.00%. Moderate snoring was noted during this study.  CARDIAC DATA The 2 lead EKG demonstrated sinus rhythm. The mean heart rate was 68.44  beats per minute. Other EKG findings include: None.  LEG MOVEMENT DATA The total PLMS were 0 with a resulting PLMS index of 0.00. Associated arousal with leg movement index was 0.0 .  IMPRESSIONS - While she had several respiratory events, these were not frequent enough to qualify for diagnosis of obstructive sleep apnea.  Her AHI was 0.7 and SaO2 low was 89%.  DIAGNOSIS - Snoring (786.09 [R06.83 ICD 10])  RECOMMENDATIONS - Avoid alcohol, sedatives and other CNS depressants that may worsen sleep apnea and disrupt normal sleep architecture. - Sleep hygiene should be reviewed to assess factors that may improve sleep quality. - Weight management and regular exercise should be initiated or continued if appropriate.  [Electronically signed] 12/28/2015 03:20 PM  Chesley Mires MD, [place], American Board of Sleep Medicine   NPI: [id number]

## 2015-12-30 DIAGNOSIS — R0683 Snoring: Secondary | ICD-10-CM

## 2015-12-31 NOTE — Telephone Encounter (Signed)
LM x 1 

## 2016-01-03 NOTE — Telephone Encounter (Signed)
Called spoke with pt. Reviewed results and recs. Pt voiced understanding and had no further questions. Scheduled ov with VS on 03/06/16. Nothing further needed.

## 2016-01-18 ENCOUNTER — Ambulatory Visit: Payer: Self-pay | Admitting: Internal Medicine

## 2016-01-18 ENCOUNTER — Telehealth: Payer: Self-pay | Admitting: Pulmonary Disease

## 2016-01-18 ENCOUNTER — Emergency Department (HOSPITAL_COMMUNITY)
Admission: EM | Admit: 2016-01-18 | Discharge: 2016-01-18 | Disposition: A | Discharge status: Home / Self Care | Payer: BLUE CROSS/BLUE SHIELD | Attending: Emergency Medicine | Admitting: Emergency Medicine

## 2016-01-18 ENCOUNTER — Telehealth: Payer: Self-pay | Admitting: Internal Medicine

## 2016-01-18 ENCOUNTER — Encounter (HOSPITAL_COMMUNITY): Payer: Self-pay

## 2016-01-18 DIAGNOSIS — Y929 Unspecified place or not applicable: Secondary | ICD-10-CM | POA: Diagnosis not present

## 2016-01-18 DIAGNOSIS — Y9301 Activity, walking, marching and hiking: Secondary | ICD-10-CM | POA: Insufficient documentation

## 2016-01-18 DIAGNOSIS — Z79899 Other long term (current) drug therapy: Secondary | ICD-10-CM | POA: Insufficient documentation

## 2016-01-18 DIAGNOSIS — Z96642 Presence of left artificial hip joint: Secondary | ICD-10-CM | POA: Insufficient documentation

## 2016-01-18 DIAGNOSIS — M79604 Pain in right leg: Secondary | ICD-10-CM | POA: Insufficient documentation

## 2016-01-18 DIAGNOSIS — Y999 Unspecified external cause status: Secondary | ICD-10-CM | POA: Insufficient documentation

## 2016-01-18 DIAGNOSIS — X501XXA Overexertion from prolonged static or awkward postures, initial encounter: Secondary | ICD-10-CM | POA: Insufficient documentation

## 2016-01-18 DIAGNOSIS — M79661 Pain in right lower leg: Secondary | ICD-10-CM

## 2016-01-18 MED ORDER — ENOXAPARIN SODIUM 60 MG/0.6ML ~~LOC~~ SOLN
1.0000 mg/kg | Freq: Once | SUBCUTANEOUS | Status: AC
  Administered 2016-01-18: 60 mg via SUBCUTANEOUS
  Filled 2016-01-18: qty 0.6

## 2016-01-18 NOTE — Telephone Encounter (Signed)
Spoke with pt, she was currently at the ED when I called.

## 2016-01-18 NOTE — Discharge Instructions (Signed)
Please return to the ER tomorrow and request for a venous doppler study of your right leg to rule out blood clot causing your right calf pain.

## 2016-01-18 NOTE — Telephone Encounter (Signed)
E NOTE: All timestamps contained within this report are represented as [place] Standard Time. CONFIDENTIALTY NOTICE: This fax transmission is intended only for the addressee. It contains information that is legally privileged, confidential or otherwise protected from use or disclosure. If you are not the intended recipient, you are strictly prohibited from reviewing, disclosing, copying using or disseminating any of this information or taking any action in reliance on or regarding this information. If you have received this fax in error, please notify us immediately by telephone so that we can arrange for its return to [place]. Phone: [phone], Toll-Free: [phone], Fax: [phone] Page: 1 of 1 Call Id: [id number] Patricia Conley - Client [place] Patient Name: Patricia Conley) SHO RT DOB: 01/29/1951 Initial Comment Caller states she may have a blood clot in her upper leg calf. She was walking Sunday Eve, she felt soreness in her calf. Feels like a deep bruise. Foot feels swelled, but appears not to be. Nurse Assessment Nurse: Dimas Chyle, RN, Dellis Filbert Date/Time Eilene Ghazi Time): 01/18/2016 2:55:04 PM Confirm and document reason for call. If symptomatic, describe symptoms. You must click the next button to save text entered. ---Caller states she may have a blood clot in her upper leg calf. She was walking Sunday Eve, she felt soreness in her calf. Feels like a deep bruise. Foot feels swelled, but appears not to be. Has the patient traveled out of the country within the last 30 days? ---No Does the patient have any new or worsening symptoms? ---Yes Will a triage be completed? ---Yes Related visit to physician within the last 2 weeks? ---N/A Does the PT have any chronic conditions? (i.e. diabetes, asthma, etc.) ---Yes List chronic conditions. ---Hyperthyroid, osteoporosis Is this a behavioral health or substance abuse call?  ---No Guidelines Guideline Title Affirmed Question Affirmed Notes Leg Pain [1] Thigh or calf pain AND [2] only 1 side AND [3] present > 1 hour Final Disposition User See Physician within 4 Hours (or PCP triage) Dimas Chyle, RN, Dellis Filbert Referrals REFERRED TO PCP OFFICE Disagree/Comply: Leta Baptist

## 2016-01-18 NOTE — ED Triage Notes (Signed)
Pt was walking 2 miles this weekend.  Now has had 2 days of rt lower calf pain.

## 2016-01-18 NOTE — Telephone Encounter (Signed)
Spoke with pt and she states that this past Sunday she was walking and felt a pain in her right calf. She says it felt better yesterday and went walking again and pain returned. Pt has applied ice with no relief. Pt states that it "feels bruised inside". Pt has attempted to contact PCP, Dr Quay Burow, but was disconnected 5 times per pt. She would like to speak to PCP office, so I transferred her call to them.   Sending message to Dr Quay Burow as Juluis Rainier.

## 2016-01-18 NOTE — ED Provider Notes (Signed)
[facility] DEPT Provider Note   CSN: [id number] Arrival date & time: 01/18/16  1631  By signing my name below, I, Soijett Blue, attest that this documentation has been prepared under the direction and in the presence of Domenic Moras, PA-C Electronically Signed: Soijett Blue, ED Scribe. 01/18/16. 10:45 PM.   History   Chief Complaint Chief Complaint  Patient presents with  . Leg Pain    HPI  Patricia Conley is a 64 y.o. female who presents to the Emergency Department complaining of right lower leg pain onset 2 days ago. Pt notes that she was ambulating a couple miles, when she felt pain to her right lower leg during the walk. Pt describes her right lower leg pain as a deep, bruising sensation. Pt notes that it is typical for her to ambulate for prolonged distances. Pt states that her right lower leg pain was mildly alleviated yesterday and she then went for another prolonged distance walk and she began to experience right lower leg pain again. Pt now voices concern for if she has a blood clot. Pt denies any recent trauma or fall. Pt states that she called her PCP office and spoke with the nursing hotline and was informed to come into the ED for further evaluation of her symptoms. She notes that she has tried ice without medications for the relief of her symptoms. She denies color change, wound, rash, right knee pain, hip pain, back pain, CP, SOB, cough, hemoptysis, swelling, and any other symptoms. Pt denies recent travel, immobilization, hx of CA, surgery, estrogen use, or hx of blood clots. Pt notes that her mother had a hx of blood clots, but she is unsure of what the cause of her mothers' blood clots were. Pt states that she has an appointment with her PCP tomorrow at 11 AM.   The history is provided by the patient. No language interpreter was used.    Past Medical History:  Diagnosis Date  . Acetabular labrum tear    right/ per pt history  . Arthritis   . Gall bladder disease      per pt report  . GERD (gastroesophageal reflux disease)   . Hemorrhoids   . Menopause   . Osteoporosis   . Thyroid disease     Patient Active Problem List   Diagnosis Date Noted  . Snoring 12/28/2015  . Rash and nonspecific skin eruption 11/06/2015  . Palpitations 08/05/2015  . 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 Abdominal pain, left upper quadrant 05/19/2015  . Osteoporosis 03/25/2015  . Dry eye 03/25/2015  . Elevated urinary free cortisol level 10/29/2014  . Hair loss 09/18/2014  . 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 Acute medial meniscal tear 09/10/2013  . Abnormal TSH 08/25/2013  . Bunion of great toe 11/15/2012  . Arthralgia of ankle or foot 11/15/2012  . DJD (degenerative joint disease) 11/11/2012  . History of gout 11/11/2012  . GERD (gastroesophageal reflux disease) 10/27/2012  . Vaginal bleeding 04/30/2012  . S/P left THA, AA 09/12/2011  . Hemorrhoids, external without complications [id number]  . Renal cyst 12/15/2010  . Adrenal nodule ([place]) 12/15/2010  . Liver hemangioma 12/15/2010  . Benign breast cyst in female   . Menopause   . Gall bladder disease     Past Surgical History:  Procedure Laterality Date  . BREAST SURGERY  2002   cyst on Left breast  . CYSTECTOMY  1999   Right elbow  . HEMORRHOID BANDING  07/2014  . JOINT REPLACEMENT    .  KNEE ARTHROSCOPY Left 09/10/2013   Procedure: LEFT MEDIAL AND LATERAL KNEE ARTHROSCOPY WITH MENISCAL DEBRIDEMENT ;  Surgeon: Gearlean Alf, MD;  Location: [facility] ORS;  Service: Orthopedics;  Laterality: Left;  . NASAL SINUS SURGERY  1995, 2002  . TOTAL HIP ARTHROPLASTY  09/12/2011   Procedure: TOTAL HIP ARTHROPLASTY ANTERIOR APPROACH;  Surgeon: Mauri Pole, MD;  Location: [facility];  Service: Orthopedics;  Laterality: Left;    OB History    No data available       Home Medications    Prior to Admission medications   Medication Sig Start Date End Date Taking? Authorizing Provider  alendronate  (FOSAMAX) 70 MG tablet Take 1 tablet (70 mg total) by mouth every 7 (seven) days. Take with a full glass of water on an empty stomach. 10/18/15   Binnie Rail, MD  Amino Acids (AMINO ACID PO) Take 1 tablet by mouth 2 (two) times daily.    Historical Provider, MD  b complex vitamins capsule Take 1 capsule by mouth daily.    Historical Provider, MD  Betaine HCl 300 MG TABS Take 300 mg by mouth daily.    Historical Provider, MD  Biotin 1 MG CAPS Take by mouth.    Historical Provider, MD  cetirizine (ZYRTEC) 10 MG tablet Take 10 mg by mouth daily.    Historical Provider, MD  Cholecalciferol (CVS VIT D 5000 HIGH-POTENCY PO) Take 5,000 Units by mouth daily.    Historical Provider, MD  DHA-PHOSPHATIDYLSERINE PO Take 100 mg by mouth 2 (two) times daily.    Historical Provider, MD  EVENING PRIMROSE OIL PO Take by mouth.    Historical Provider, MD  Famotidine (PEPCID PO) Take 10 mg by mouth 2 (two) times daily.    Historical Provider, MD  glucosamine-chondroitin (GLUCOSAMINE-CHONDROITIN DS) 500-400 MG tablet Take by mouth.    Historical Provider, MD  ketoconazole (NIZORAL) 2 % shampoo  03/09/15   Historical Provider, MD  Magnesium 500 MG CAPS Take 500 mg by mouth daily.    Historical Provider, MD  methimazole (TAPAZOLE) 5 MG tablet Take 2.5 mg by mouth daily. 03/22/15   Historical Provider, MD  Omega-3 Fatty Acids (OMEGA 3 PO) Take 1 capsule by mouth 2 (two) times daily.    Historical Provider, MD  Potassium 99 MG TABS Take 99 mg by mouth daily.    Historical Provider, MD  PREMARIN vaginal cream  02/14/15   Historical Provider, MD  RESTORA RX 60-1.25 MG CAPS Take 1 tablet by mouth daily. 10/06/15   Historical Provider, MD  Theanine 100 MG CAPS Take 100 mg by mouth 2 (two) times daily.    Historical Provider, MD  valACYclovir (VALTREX) 500 MG tablet Take 1 tablet (500 mg total) by mouth daily. 07/27/15   Binnie Rail, MD  vitamin C (ASCORBIC ACID) 500 MG tablet Take 500 mg by mouth daily.    Historical  Provider, MD  zinc gluconate 50 MG tablet Take 50 mg by mouth daily.    Historical Provider, MD    Family History Family History  Problem Relation Age of Onset  . Stroke Mother   . Nephrolithiasis Mother   . Diabetes Mother   . Aneurysm Mother     x2  . Cancer Mother     lymphoma, breast cancer  . Depression Father   . Parkinsonism Father   . Drug abuse Father   . Mental illness Sister     paranoia    Social History Social History  Substance Use Topics  . Smoking status: Never Smoker  . Smokeless tobacco: Never Used  . Alcohol use 0.6 oz/week    1 Standard drinks or equivalent per week     Comment: once weekly or 2-3 drinks per month     Allergies   Histamine   Review of Systems Review of Systems  Respiratory: Negative for cough and shortness of breath.        No hemoptysis.   Cardiovascular: Negative for chest pain.  Musculoskeletal: Positive for arthralgias (right lower leg). Negative for back pain.  Skin: Negative for color change, rash and wound.  All other systems reviewed and are negative.    Physical Exam Updated Vital Signs BP 98/70 (BP Location: Left Arm)   Pulse 74   Temp 97.8 F (36.6 C) (Oral)   SpO2 100%   Physical Exam  Constitutional: She is oriented to person, place, and time. She appears well-developed and well-nourished. No distress.  HENT:  Head: Normocephalic and atraumatic.  Eyes: EOM are normal.  Neck: Neck supple.  Cardiovascular: Normal rate, regular rhythm and normal heart sounds.  Exam reveals no gallop and no friction rub.   No murmur heard. Dorsalis pedis pulses palpable bilaterally  Pulmonary/Chest: Effort normal and breath sounds normal. No respiratory distress. She has no wheezes. She has no rales.  Abdominal: Soft. She exhibits no distension. There is no tenderness.  Musculoskeletal: Normal range of motion.       Right knee: No tenderness found.       Right lower leg: She exhibits tenderness. She exhibits no edema.        Left lower leg: She exhibits no edema.  No significant midline spinal tenderness. Mild tenderness noted to right posterior calf on palpation without any overlying skin changes. Right knee is non-tender to palpation. BLE without palpable cords, erythema, or edema.   Neurological: She is alert and oriented to person, place, and time.  Skin: Skin is warm and dry.  Psychiatric: She has a normal mood and affect. Her behavior is normal.  Nursing note and vitals reviewed.    ED Treatments / Results  DIAGNOSTIC STUDIES: Oxygen Saturation is 100% on RA, nl by my interpretation.    COORDINATION OF CARE: 10:41 PM Discussed treatment plan with pt at bedside which includes levonox injection and Vas [place] LE venous (DVT), and pt agreed to plan.  Radiology No results found.  Procedures Procedures (including critical care time)  Medications Ordered in ED Medications  enoxaparin (LOVENOX) injection 60 mg (60 mg Subcutaneous Given 01/18/16 2320)     Initial Impression / Assessment and Plan / ED Course  I have reviewed the triage vital signs and the nursing notes.  Pertinent imaging results that were available during my care of the patient were reviewed by me and considered in my medical decision making (see chart for details).  Clinical Course    BP 126/84 (BP Location: Left Arm)   Pulse 69   Temp 97.8 F (36.6 C) (Oral)   Resp 18   Wt 61.7 kg   SpO2 99%   BMI 21.30 kg/m    Final Clinical Impressions(s) / ED Diagnoses   Final diagnoses:  Calf pain, right    New Prescriptions New Prescriptions   No medications on file    BP 126/84 (BP Location: Left Arm)   Pulse 69   Temp 97.8 F (36.6 C) (Oral)   Resp 18   Wt 61.7 kg   SpO2 99%   BMI  21.30 kg/m    10:48 PM Patient presents complaining of pain in her right calf after a walk several days ago. She voiced concern for potential blood clot. She has no significant risk factor for blood clots except for her age. No  evidence to suggest bony pathology, skin infection, septic joint, or radicular pain. Due to lack of accessibility to vascular staff, patient will be giving a dose of Lovenox 1 mg/kg prophylaxis and she will return tomorrow for a venous Doppler study to rule out DVT. Otherwise patient is stable for discharge.    Domenic Moras, PA-C 01/18/16 [place], MD 01/18/16 [phone]

## 2016-01-18 NOTE — Telephone Encounter (Signed)
Needs to be evaluated.

## 2016-01-19 ENCOUNTER — Ambulatory Visit (HOSPITAL_COMMUNITY)
Admission: RE | Admit: 2016-01-19 | Discharge: 2016-01-19 | Disposition: A | Discharge status: Home / Self Care | Payer: BLUE CROSS/BLUE SHIELD | Source: Ambulatory Visit | Attending: Emergency Medicine | Admitting: Emergency Medicine

## 2016-01-19 ENCOUNTER — Ambulatory Visit: Payer: Self-pay | Admitting: Internal Medicine

## 2016-01-19 DIAGNOSIS — M79661 Pain in right lower leg: Secondary | ICD-10-CM | POA: Diagnosis not present

## 2016-01-19 DIAGNOSIS — M79604 Pain in right leg: Secondary | ICD-10-CM

## 2016-01-19 NOTE — Progress Notes (Signed)
Preliminary results by tech - Right Lower Ext. Venous Duplex Completed. Negative for deep and superficial vein thrombosis in the right leg.  Ameer Sanden, BS, RDMS, RVT  

## 2016-02-08 ENCOUNTER — Encounter: Payer: Self-pay | Admitting: Internal Medicine

## 2016-02-08 DIAGNOSIS — R87610 Atypical squamous cells of undetermined significance on cytologic smear of cervix (ASC-US): Secondary | ICD-10-CM | POA: Insufficient documentation

## 2016-03-06 ENCOUNTER — Encounter: Payer: Self-pay | Admitting: Pulmonary Disease

## 2016-03-06 ENCOUNTER — Ambulatory Visit (INDEPENDENT_AMBULATORY_CARE_PROVIDER_SITE_OTHER): Payer: BLUE CROSS/BLUE SHIELD | Admitting: Pulmonary Disease

## 2016-03-06 VITALS — BP 112/68 | HR 80 | Ht 67.0 in | Wt 142.2 lb

## 2016-03-06 DIAGNOSIS — R0683 Snoring: Secondary | ICD-10-CM | POA: Diagnosis not present

## 2016-03-06 NOTE — Progress Notes (Signed)
Current Outpatient Prescriptions on File Prior to Visit  Medication Sig  . alendronate (FOSAMAX) 70 MG tablet Take 1 tablet (70 mg total) by mouth every 7 (seven) days. Take with a full glass of water on an empty stomach.  Marland Kitchen b complex vitamins capsule Take 1 capsule by mouth daily.  . Biotin 1 MG CAPS Take by mouth.  . cetirizine (ZYRTEC) 10 MG tablet Take 10 mg by mouth daily.  . Cholecalciferol (CVS VIT D 5000 HIGH-POTENCY PO) Take 5,000 Units by mouth daily.  Marland Kitchen EVENING PRIMROSE OIL PO Take by mouth.  . Famotidine (PEPCID PO) Take 10 mg by mouth 2 (two) times daily.  Marland Kitchen ketoconazole (NIZORAL) 2 % shampoo   . Magnesium 500 MG CAPS Take 500 mg by mouth daily.  . methimazole (TAPAZOLE) 5 MG tablet Take 2.5 mg by mouth daily.  . Omega-3 Fatty Acids (OMEGA 3 PO) Take 1 capsule by mouth 2 (two) times daily.  . Potassium 99 MG TABS Take 99 mg by mouth daily.  Mathis Bud RX 60-1.25 MG CAPS Take 1 tablet by mouth daily.  . Theanine 100 MG CAPS Take 100 mg by mouth 2 (two) times daily.  . valACYclovir (VALTREX) 500 MG tablet Take 1 tablet (500 mg total) by mouth daily.  . vitamin C (ASCORBIC ACID) 500 MG tablet Take 500 mg by mouth daily.  Marland Kitchen zinc gluconate 50 MG tablet Take 50 mg by mouth daily.   No current facility-administered medications on file prior to visit.      Chief Complaint  Patient presents with  . Follow-up    Review sleep study with patient.      Sleep tests PSG 12/26/15 >> AHI 0.7, SaO2 low 89%  Past medical history GERD, Osteoporosis, Allergies, Hyperthyroidism, TMJ  Past surgical history, Family history, Social history, Allergies all reviewed.  Vital Signs BP 112/68 (BP Location: Left Arm, Cuff Size: Normal)   Pulse 80   Ht 5\' 7"  (1.702 m)   Wt 142 lb 3.2 oz (64.5 kg)   SpO2 100%   BMI 22.27 kg/m   History of Present Illness Patricia Conley is a 65 y.o. female with snoring.  She is here to review her sleep study from September.  This showed respiratory  events and low oxygen, but not frequent enough to meet diagnostic criteria for sleep apnea.    She continues to have trouble with her sleep.  She had a hard time getting comfortable in sleep lab, and isn't sure her sleep study in sleep lab was true reflection of her sleep pattern.  She did have delayed sleep onset, and decrease sleep efficiency.  Her daughter is still worried about her snoring, and says she stops breathing frequently while asleep.  She can sleep for 8 or 9 hours, but still feels tired.  She has noticed that she can no longer sleep on her back, and will wake up feeling like she is choked if she sleeps on her back.  Physical Exam  General - No distress ENT - No sinus tenderness, no oral exudate, no LAN, MP 4, low laying soft palate Cardiac - s1s2 regular, no murmur Chest - No wheeze/rales/dullness Back - No focal tenderness Abd - Soft, non-tender Ext - No edema Neuro - Normal strength Skin - No rashes Psych - normal mood, and behavior  Discussion: She still has complaints of snoring, sleep disruption, apnea, and daytime sleepiness.  Her recent in lab sleep study showed respiratory events, but not frequent enough to  qualify for diagnosis of obstructive sleep apnea.  She reports difficulty sleeping in sleep lab, and this might not been an accurate assessment of her respiratory issues while asleep.  Assessment/Plan  Snoring. 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

## 2016-03-06 NOTE — Patient Instructions (Signed)
Will try to arrange for home sleep study  Will call to schedule follow up after sleep study reviewed

## 2016-03-08 ENCOUNTER — Ambulatory Visit: Payer: BLUE CROSS/BLUE SHIELD | Admitting: Podiatry

## 2016-03-15 ENCOUNTER — Ambulatory Visit (INDEPENDENT_AMBULATORY_CARE_PROVIDER_SITE_OTHER): Payer: BLUE CROSS/BLUE SHIELD | Admitting: Podiatry

## 2016-03-15 ENCOUNTER — Encounter: Payer: Self-pay | Admitting: Podiatry

## 2016-03-15 VITALS — Ht 67.0 in | Wt 142.0 lb

## 2016-03-15 DIAGNOSIS — M21969 Unspecified acquired deformity of unspecified lower leg: Secondary | ICD-10-CM

## 2016-03-15 DIAGNOSIS — M216X2 Other acquired deformities of left foot: Secondary | ICD-10-CM | POA: Diagnosis not present

## 2016-03-15 DIAGNOSIS — M216X1 Other acquired deformities of right foot: Secondary | ICD-10-CM | POA: Diagnosis not present

## 2016-03-15 DIAGNOSIS — M7741 Metatarsalgia, right foot: Secondary | ICD-10-CM | POA: Diagnosis not present

## 2016-03-15 DIAGNOSIS — M7742 Metatarsalgia, left foot: Secondary | ICD-10-CM | POA: Diagnosis not present

## 2016-03-15 NOTE — Patient Instructions (Signed)
Both feet casted for orthotics to replace 63 year old orthotics. Still having pain at the first ray without orthotics.

## 2016-03-15 NOTE — Progress Notes (Signed)
Subjective: 65 year old female presents requesting a new pair orthotics. Last orthotics were made in August 2014. Still having pain along the first metatarsal shaft with weight bearing and ambulation. Also having problem with knee joints.   Objective: Neurovascular status are within normal.  Severe hypermobile first ray with bunion deformity bilateral. No open skin lesion or abnormal edema or erythema noted. Cavus foot with excess STJ pronation with weight bearing bilateral.  Assessment: Metatarsalgia bilateral. Hypermobile first ray with bunion bilateral. 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  Plan: Reviewed findings. Continue with Metatarsal binder. Both feet casted for Orthotics.

## 2016-04-12 ENCOUNTER — Ambulatory Visit (INDEPENDENT_AMBULATORY_CARE_PROVIDER_SITE_OTHER): Payer: BLUE CROSS/BLUE SHIELD | Admitting: Internal Medicine

## 2016-04-12 ENCOUNTER — Encounter: Payer: Self-pay | Admitting: Internal Medicine

## 2016-04-12 VITALS — BP 126/68 | HR 80 | Temp 98.4°F | Resp 20 | Wt 139.0 lb

## 2016-04-12 DIAGNOSIS — M25571 Pain in right ankle and joints of right foot: Secondary | ICD-10-CM | POA: Diagnosis not present

## 2016-04-12 DIAGNOSIS — M25849 Other specified joint disorders, unspecified hand: Secondary | ICD-10-CM

## 2016-04-12 DIAGNOSIS — R2230 Localized swelling, mass and lump, unspecified upper limb: Secondary | ICD-10-CM | POA: Diagnosis not present

## 2016-04-12 DIAGNOSIS — R21 Rash and other nonspecific skin eruption: Secondary | ICD-10-CM

## 2016-04-12 MED ORDER — IBUPROFEN 600 MG PO TABS: 600.0000 mg | ORAL_TABLET | Freq: Three times a day (TID) | ORAL | 1 refills | Status: DC | PRN

## 2016-04-12 NOTE — Progress Notes (Signed)
Subjective:    Patient ID: Patricia Conley, female    DOB: 07-04-1950, 65 y.o.   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  Also incidentally has new ? Cystic type mass to the right index finger PIP, as well as other finger OA with intermittent pain and aching.  Also with mention of color changes to fingers that can last for days at a time with mild discomfort , off and on mild for several months, no color change or discomfort currently. Past Medical History:  Diagnosis Date  . Acetabular labrum tear    right/ per pt history  . Arthritis   . Gall bladder disease    per pt report  . Ganglion cyst   . GERD (gastroesophageal reflux disease)   . Hemorrhoids   . Menopause   . Osteoporosis   . Thyroid disease    Past Surgical History:  Procedure Laterality Date  . BREAST SURGERY  2002   cyst on Left breast  . CYSTECTOMY  1999   Right elbow  . HEMORRHOID BANDING  07/2014  . JOINT REPLACEMENT    . KNEE ARTHROSCOPY Left 09/10/2013   Procedure: LEFT MEDIAL AND LATERAL KNEE ARTHROSCOPY WITH MENISCAL DEBRIDEMENT ;  Surgeon: Gearlean Alf, MD;  Location: [facility];  Service: Orthopedics;  Laterality: Left;  . NASAL SINUS SURGERY  1995, 2002  . TOTAL HIP ARTHROPLASTY  09/12/2011   Procedure: TOTAL HIP ARTHROPLASTY ANTERIOR APPROACH;  Surgeon: Mauri Pole, MD;  Location: [facility];  Service: Orthopedics;  Laterality: Left;    reports that she has never smoked. She has never used smokeless tobacco. She reports that she drinks about 0.6 oz of alcohol per week . She reports that she does not use  drugs. family history includes Aneurysm in her mother; Cancer in her mother; Depression in her father; Diabetes in her mother; Drug abuse in her father; Mental illness in her sister; Nephrolithiasis in her mother; Parkinsonism in her father; Stroke in her mother. Allergies  Allergen Reactions  . Histamine Rash    Flushing   Current Outpatient Prescriptions on File Prior to Visit  Medication Sig Dispense Refill  . alendronate (FOSAMAX) 70 MG tablet Take 1 tablet (70 mg total) by mouth every 7 (seven) days. Take with a full glass of water on an empty stomach. 4 tablet 11  . b complex vitamins capsule Take 1 capsule by mouth daily.    . Biotin 1 MG CAPS Take by mouth.    . cetirizine (ZYRTEC) 10 MG tablet Take 10 mg by mouth daily.    . Cholecalciferol (CVS VIT D 5000 HIGH-POTENCY PO) Take 5,000 Units by mouth daily.    Marland Kitchen EVENING PRIMROSE OIL PO Take by mouth.    . Famotidine (PEPCID PO) Take 10 mg by mouth 2 (two) times daily.    Marland Kitchen ketoconazole (NIZORAL) 2 % shampoo   99  . Magnesium 500 MG CAPS Take 500 mg by mouth daily.    . methimazole (TAPAZOLE) 5 MG tablet Take 2.5 mg by mouth daily.  11  . Omega-3 Fatty Acids (OMEGA 3 PO) Take 1 capsule by mouth 2 (two) times daily.    . Potassium 99 MG TABS Take 99 mg by mouth daily.    Mathis Bud RX 60-1.25 MG CAPS Take 1 tablet by mouth daily.  11  . Theanine 100 MG CAPS Take 100 mg by mouth 2 (two) times daily.    . valACYclovir (VALTREX) 500 MG tablet Take 1 tablet (500 mg total) by mouth daily. 90 tablet 1  . vitamin C (ASCORBIC ACID) 500 MG tablet Take 500 mg by mouth daily.    Marland Kitchen zinc gluconate 50 MG tablet Take 50 mg by mouth daily.     No current facility-administered medications on file prior to visit.    Review of Systems  Constitutional: Negative for unusual diaphoresis or night sweats HENT: Negative for ear swelling or discharge Eyes: Negative for worsening visual haziness  Respiratory: Negative for choking and stridor.     Gastrointestinal: Negative for distension or worsening eructation Genitourinary: Negative for retention or change in urine volume.  Musculoskeletal: Negative for other MSK pain or swelling Skin: Negative for color change and worsening wound Neurological: Negative for tremors and numbness other than noted  Psychiatric/Behavioral: Negative for decreased concentration or agitation other than above   All other system neg per pt    Objective:   Physical Exam BP 126/68   Pulse 80   Temp 98.4 F (36.9 C) (Oral)   Resp 20   Wt 139 lb (63 kg)   SpO2 96%   BMI 21.77 kg/m  VS noted,  Constitutional: Pt appears in no apparent distress HENT: Head: NCAT.  Right Ear: External ear normal.  Left Ear: External ear normal.  Eyes: . Pupils are equal, round, and reactive to light. Conjunctivae and EOM are normal Neck: Normal range of motion. Neck supple.  Cardiovascular: Normal rate and regular rhythm.   Pulmonary/Chest: Effort normal and breath sounds without rales or wheezing.  Right lateral ankle with 1-2+ tender.swelling/slight erythema and ? Cystic mass along tarsal tunnel Right finger PIP with 5 mm firm but non bony mass assoc with the joint but no reduced ROM or overlying skin change Mult other finger joints with bony degenerative changes Neurological: Pt is alert. Not confused , motor grossly intact Skin: Skin is warm. No rash, no LE edema Psychiatric: Pt behavior is normal. No agitation.  No other new exam, findings    Assessment & Plan:

## 2016-04-12 NOTE — Progress Notes (Signed)
Pre visit review using our clinic review tool, if applicable. No additional management support is needed unless otherwise documented below in the visit note. 

## 2016-04-12 NOTE — Patient Instructions (Signed)
Please take all new medication as prescribed - the ibuprofen  You should also take OTC Tumeric  You will be contacted regarding the referral for: Dr Tamala Julian (or make an appt at the desk as you leave today)  Please consider hand surgury, and rheumatology referrals as well  Please continue all other medications as before, and refills have been done if requested.  Please have the pharmacy call with any other refills you may need.  Please keep your appointments with your specialists as you may have planned

## 2016-04-13 ENCOUNTER — Emergency Department (HOSPITAL_COMMUNITY): Payer: BLUE CROSS/BLUE SHIELD

## 2016-04-13 ENCOUNTER — Encounter (HOSPITAL_COMMUNITY): Payer: Self-pay | Admitting: Emergency Medicine

## 2016-04-13 ENCOUNTER — Emergency Department (HOSPITAL_COMMUNITY)
Admission: EM | Admit: 2016-04-13 | Discharge: 2016-04-14 | Disposition: A | Discharge status: Home / Self Care | Payer: BLUE CROSS/BLUE SHIELD | Attending: Emergency Medicine | Admitting: Emergency Medicine

## 2016-04-13 DIAGNOSIS — R51 Headache: Secondary | ICD-10-CM | POA: Diagnosis not present

## 2016-04-13 DIAGNOSIS — Y999 Unspecified external cause status: Secondary | ICD-10-CM | POA: Insufficient documentation

## 2016-04-13 DIAGNOSIS — Y939 Activity, unspecified: Secondary | ICD-10-CM | POA: Diagnosis not present

## 2016-04-13 DIAGNOSIS — Y92410 Unspecified street and highway as the place of occurrence of the external cause: Secondary | ICD-10-CM | POA: Diagnosis not present

## 2016-04-13 DIAGNOSIS — S161XXA Strain of muscle, fascia and tendon at neck level, initial encounter: Secondary | ICD-10-CM

## 2016-04-13 DIAGNOSIS — Z96642 Presence of left artificial hip joint: Secondary | ICD-10-CM | POA: Insufficient documentation

## 2016-04-13 DIAGNOSIS — M542 Cervicalgia: Secondary | ICD-10-CM

## 2016-04-13 DIAGNOSIS — S199XXA Unspecified injury of neck, initial encounter: Secondary | ICD-10-CM | POA: Diagnosis present

## 2016-04-13 HISTORY — DX: Ganglion, unspecified site: M67.40

## 2016-04-13 MED ORDER — TRAMADOL HCL 50 MG PO TABS
50.0000 mg | ORAL_TABLET | Freq: Once | ORAL | Status: DC
  Filled 2016-04-13: qty 1

## 2016-04-13 MED ORDER — METHOCARBAMOL 500 MG PO TABS
750.0000 mg | ORAL_TABLET | Freq: Once | ORAL | Status: AC
  Administered 2016-04-13: 750 mg via ORAL
  Filled 2016-04-13: qty 2

## 2016-04-13 MED ORDER — METHOCARBAMOL 500 MG PO TABS
750.0000 mg | ORAL_TABLET | Freq: Once | ORAL | Status: DC
  Filled 2016-04-13: qty 2
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
## 2016-04-14 MED ORDER — METHOCARBAMOL 750 MG PO TABS: 750.0000 mg | ORAL_TABLET | Freq: Two times a day (BID) | ORAL | 0 refills | Status: AC

## 2016-04-14 NOTE — ED Notes (Signed)
Patient d/c'd self care.  F/U and medications reviewed.  Patient verbalized understanding. 
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
## 2016-04-16 DIAGNOSIS — M25849 Other specified joint disorders, unspecified hand: Secondary | ICD-10-CM | POA: Insufficient documentation

## 2016-04-16 NOTE — Assessment & Plan Note (Signed)
?   raynauds vs other such as vasculitis, exam benign, declines labs, to consider rheum referral

## 2016-04-16 NOTE — Assessment & Plan Note (Signed)
Mild incidental, not functionally limiting, likely cystic in nature, to cont monitor, to hand surgury for any worsening

## 2016-04-16 NOTE — Assessment & Plan Note (Signed)
Mild to mod, suspect tarsal tunnell and/or ganglion cyst, for pain control, refer sport medicine,  to f/u any worsening symptoms or concerns

## 2016-05-04 ENCOUNTER — Ambulatory Visit: Payer: BLUE CROSS/BLUE SHIELD | Admitting: Family Medicine

## 2016-05-16 ENCOUNTER — Ambulatory Visit: Payer: BLUE CROSS/BLUE SHIELD | Admitting: Family Medicine

## 2016-06-10 NOTE — Progress Notes (Signed)
[name] [address], [place] [address] Phone: [phone] Subjective:    I'm seeing this patient by the request  of:  Binnie Rail, MD John MD.  CC: ankle pain and swelling  [id number]  Patricia Conley is a 66 y.o. female coming in with complaint of right ankle pain and swelling. Been going on for 3-4 months now. Feels that she sprained her ankle but seems to be worsening and not getting better. Seems to get better with activity and worse with rest.  Patient states that overall the swelling has started to subside over the course last several weeks. Patient does not remember any true injury but maybe twisted ankle was 3 months ago.     Past Medical History:  Diagnosis Date  . Acetabular labrum tear    right/ per pt history  . Arthritis   . Gall bladder disease    per pt report  . Ganglion cyst   . GERD (gastroesophageal reflux disease)   . Hemorrhoids   . Menopause   . Osteoporosis   . Thyroid disease    Past Surgical History:  Procedure Laterality Date  . BREAST SURGERY  2002   cyst on Left breast  . CYSTECTOMY  1999   Right elbow  . HEMORRHOID BANDING  07/2014  . JOINT REPLACEMENT    . KNEE ARTHROSCOPY Left 09/10/2013   Procedure: LEFT MEDIAL AND LATERAL KNEE ARTHROSCOPY WITH MENISCAL DEBRIDEMENT ;  Surgeon: Gearlean Alf, MD;  Location: [facility];  Service: Orthopedics;  Laterality: Left;  . NASAL SINUS SURGERY  1995, 2002  . TOTAL HIP ARTHROPLASTY  09/12/2011   Procedure: TOTAL HIP ARTHROPLASTY ANTERIOR APPROACH;  Surgeon: Mauri Pole, MD;  Location: [facility];  Service: Orthopedics;  Laterality: Left;   Social History   Social History  . Marital status: Divorced    Spouse name: N/A  . Number of children: N/A  . Years of education: N/A   Occupational History  . Accountant    Social History Main Topics  . Smoking status: Never Smoker  . Smokeless tobacco: Never Used  . Alcohol use 0.6 oz/week    1 Standard  drinks or equivalent per week     Comment: once weekly or 2-3 drinks per month  . Drug use: No  . Sexual activity: Not Currently   Other Topics Concern  . Not on file   Social History Narrative  . No narrative on file   Allergies  Allergen Reactions  . Histamine Rash    Flushing   Family History  Problem Relation Age of Onset  . Stroke Mother   . Nephrolithiasis Mother   . Diabetes Mother   . Aneurysm Mother     x2  . Cancer Mother     lymphoma, breast cancer  . Depression Father   . Parkinsonism Father   . Drug abuse Father   . Mental illness Sister     paranoia    Past medical history, social, surgical and family history all reviewed in electronic medical record.  No pertanent information unless stated regarding to the chief complaint.   Review of Systems: No headache, visual changes, nausea, vomiting, diarrhea, constipation, dizziness, abdominal pain, skin rash, fevers, chills, night sweats, weight loss, swollen lymph nodes, body aches, joint swelling, muscle aches, chest pain, shortness of breath, mood changes.    Objective  There were no vitals taken for this visit. Systems examined below as of 06/10/16  General: No apparent distress alert and oriented x3 mood and affect normal, dressed appropriately.  HEENT: Pupils equal, extraocular movements intact  Respiratory: Patient's speak in full sentences and does not appear [place] of breath  Cardiovascular: No lower extremity edema, non tender, no erythema  Skin: Warm dry intact with no signs of infection or rash on extremities or on axial skeleton.  Abdomen: Soft nontender  Neuro: Cranial nerves II through XII are intact, neurovascularly intact in all extremities with 2+ DTRs and 2+ pulses.  Lymph: No lymphadenopathy of posterior or anterior cervical chain or axillae bilaterally.  Gait normal with good balance and coordination.  MSK:  Non tender with full range of motion and good stability and symmetric strength and  tone of shoulders, elbows, wrist, hip, knees bilaterally.  Ankle:Right Cyst noted at the lateral aspect of the foot where the piriformis tendons insert. Range of motion is full in all directions. Strength is 4/5 in all directions. Stable lateral and medial ligaments; squeeze test and kleiger test unremarkable; Talar dome nontender; No pain at base of 5th MT; No tenderness over cuboid; No tenderness over N spot or navicular prominence No tenderness on posterior aspects of lateral and medial malleolus Tenderness over the peroneal tendon is inferior and distal to the lateral malleolus. Negative tarsal tunnel tinel's Able to walk 4 steps. Contralateral ankle unremarkable Exam shows patient in the transverse arch bilaterally right greater than left. Patient does have bunion and bunionette formation. Mild splaying between the first and second toe on the right  MSK US performed of: Right ankle  This study was ordered, performed, and interpreted by Charlann Boxer D.O.  Foot/Ankle:    All structures visualized.   Talar dome unremarkable  Ankle mortise without effusion. Continue effusion of the peroneal tendon noted. Patient does have what appears to be cystic formation at the insertion of the peroneal brevis. Posterior tibialis, flexor hallucis longus, and flexor digitorum longus tendons unremarkable on long and transverse views without sheath effusions. Achilles tendon visualized along length of tendon and unremarkable on long and transverse views without sheath effusion. Chronic changes of the ATFL Deltoid Ligament unremarkable and intact.   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  This included exercises focusing on stretching, strengthening, with significant focus on eccentric aspects.  Ankle strengthening that included:  Basic range of motion exercises  to allow proper full motion at ankle Stretching of the lower leg and hamstrings  Theraband exercises for the lower leg - inversion, eversion, dorsiflexion and plantarflexion each to be completed with a theraband Balance exercises to increase proprioception Weight bearing exercises to increase strength and balance  Proper technique shown and discussed handout in great detail with ATC.  All questions were discussed and answered.      Impression and Recommendations:     This case required medical decision making of moderate complexity.      Note: This dictation was prepared with Dragon dictation along with smaller phrase technology. Any transcriptional errors that result from this process are unintentional.

## 2016-06-12 ENCOUNTER — Ambulatory Visit: Payer: Self-pay

## 2016-06-12 ENCOUNTER — Encounter: Payer: Self-pay | Admitting: Family Medicine

## 2016-06-12 ENCOUNTER — Ambulatory Visit (INDEPENDENT_AMBULATORY_CARE_PROVIDER_SITE_OTHER): Payer: BLUE CROSS/BLUE SHIELD | Admitting: Family Medicine

## 2016-06-12 VITALS — BP 114/72 | HR 82 | Ht 67.0 in | Wt 139.0 lb

## 2016-06-12 DIAGNOSIS — M25571 Pain in right ankle and joints of right foot: Secondary | ICD-10-CM

## 2016-06-12 DIAGNOSIS — M216X9 Other acquired deformities of unspecified foot: Secondary | ICD-10-CM | POA: Diagnosis not present

## 2016-06-12 DIAGNOSIS — M7671 Peroneal tendinitis, right leg: Secondary | ICD-10-CM | POA: Diagnosis not present

## 2016-06-12 NOTE — Assessment & Plan Note (Signed)
Patient doesn't more of a peroneal tendinitis with cyst formation. We discussed with patient about possible injection which patient declined. We discussed icing regimen and home exercises. Discussed which active doing which ones to avoid. Work with Product/process development scientist. Follow-up again in 4 weeks

## 2016-06-12 NOTE — Patient Instructions (Addendum)
Good to see you.   Look into [email or web address] and look for a ankle compression sleeve size medium.   pennsaid pinkie amount topically 2 times daily as needed.  Exercises 3 times a week.  Good shoes with rigid bottom.  Patricia Conley, [place] or New balance greater then 700 [employer] orthotics "total support" online would be great  See me again in 4 weeks and we can see if the ankle or finger needs to be injected.

## 2016-06-26 LAB — HM MAMMOGRAPHY

## 2016-06-29 ENCOUNTER — Encounter: Payer: Self-pay | Admitting: Internal Medicine

## 2016-07-07 ENCOUNTER — Ambulatory Visit (INDEPENDENT_AMBULATORY_CARE_PROVIDER_SITE_OTHER): Payer: BLUE CROSS/BLUE SHIELD | Admitting: Internal Medicine

## 2016-07-07 ENCOUNTER — Encounter: Payer: Self-pay | Admitting: Internal Medicine

## 2016-07-07 DIAGNOSIS — N6002 Solitary cyst of left breast: Secondary | ICD-10-CM | POA: Diagnosis not present

## 2016-07-07 NOTE — Progress Notes (Signed)
   Subjective:    Patient ID: Patricia Conley, female    DOB: 1950-09-04, 66 y.o.   MRN: [id number]  HPI The patient is a 66 YO female coming in for left breast pain and lump. Going on for about 3 weeks. Saw her gyn initially due to lump and pain who palpated a lump (prior cyst removal 2002) and referred for [place] and mammogram which were negative except small cyst. The pain resided and then returned this week. She is currently not having the lump or the pain. She has not taken anything for the pain. She denies excessive caffeine intake (1 cup coffee daily only, no sodas or energy drinks).   Review of Systems  Constitutional: Negative.   Respiratory: Negative.   Cardiovascular: Negative.        Breast pain  Gastrointestinal: Negative.   Musculoskeletal: Negative.   Skin: Negative.       Objective:   Physical Exam  Constitutional: She appears well-developed and well-nourished.  HENT:  Head: Normocephalic and atraumatic.  Eyes: EOM are normal.  Cardiovascular: Normal rate and regular rhythm.   Pulmonary/Chest: Effort normal and breath sounds normal. No respiratory distress. She has no wheezes. She has no rales. She exhibits no tenderness.  No nipple discharge expressed, no lump or cyst appreciated, no pain on exam left breast  Abdominal: Soft.  Skin: Skin is warm and dry.   Vitals:   07/07/16 0818  BP: 110/70  Pulse: 85  Temp: 98.3 F (36.8 C)  TempSrc: Oral  SpO2: 99%  Weight: 139 lb (63 kg)  Height: 5\' 7"  (1.702 m)      Assessment & Plan:

## 2016-07-07 NOTE — Assessment & Plan Note (Signed)
Counseled that these are benign and she can use otc medications for pain if needed or heat. She can continue to get routine breast exams from gyn and continue with her mammograms as indicated.

## 2016-07-07 NOTE — Progress Notes (Signed)
Pre visit review using our clinic review tool, if applicable. No additional management support is needed unless otherwise documented below in the visit note. 

## 2016-07-07 NOTE — Patient Instructions (Signed)
Keep a watch on the pain and if it is coming and staying call us back.

## 2016-07-13 ENCOUNTER — Ambulatory Visit: Payer: BLUE CROSS/BLUE SHIELD | Admitting: Family Medicine

## 2016-08-07 NOTE — Progress Notes (Signed)
[name] [address], [place] [address] Phone: [phone] Subjective:    I'm seeing this patient by the request  of:  Binnie Rail, MD John MD.  CC: ankle pain and swelling New finger pain   [id number]  Patricia Conley is a 66 y.o. female coming in with complaint of right ankle pain and swelling.Found to have a cyst. Patient states that she is doing significantly better. As long as she does he exercises seems to do well. The cyst is even gotten down in size.  Patient on the right index finger has more of a cyst on the top of the hand. Patient has noticed that she is having more trouble flexing her recently. Patient does use her hand a lot desk as well as using the computer. Seems to make it worse.     Past Medical History:  Diagnosis Date  . Acetabular labrum tear    right/ per pt history  . Arthritis   . Gall bladder disease    per pt report  . Ganglion cyst   . GERD (gastroesophageal reflux disease)   . Hemorrhoids   . Menopause   . Osteoporosis   . Thyroid disease    Past Surgical History:  Procedure Laterality Date  . BREAST SURGERY  2002   cyst on Left breast  . CYSTECTOMY  1999   Right elbow  . HEMORRHOID BANDING  07/2014  . JOINT REPLACEMENT    . KNEE ARTHROSCOPY Left 09/10/2013   Procedure: LEFT MEDIAL AND LATERAL KNEE ARTHROSCOPY WITH MENISCAL DEBRIDEMENT ;  Surgeon: Gearlean Alf, MD;  Location: [facility];  Service: Orthopedics;  Laterality: Left;  . NASAL SINUS SURGERY  1995, 2002  . TOTAL HIP ARTHROPLASTY  09/12/2011   Procedure: TOTAL HIP ARTHROPLASTY ANTERIOR APPROACH;  Surgeon: Mauri Pole, MD;  Location: [facility];  Service: Orthopedics;  Laterality: Left;   Social History   Social History  . Marital status: Divorced    Spouse name: N/A  . Number of children: N/A  . Years of education: N/A   Occupational History  . Accountant    Social History Main Topics  . Smoking status: Never Smoker  .  Smokeless tobacco: Never Used  . Alcohol use 0.6 oz/week    1 Standard drinks or equivalent per week     Comment: once weekly or 2-3 drinks per month  . Drug use: No  . Sexual activity: Not Currently   Other Topics Concern  . None   Social History Narrative  . None   Allergies  Allergen Reactions  . Histamine Rash    Flushing   Family History  Problem Relation Age of Onset  . Stroke Mother   . Nephrolithiasis Mother   . Diabetes Mother   . Aneurysm Mother     x2  . Cancer Mother     lymphoma, breast cancer  . Depression Father   . Parkinsonism Father   . Drug abuse Father   . Mental illness Sister     paranoia    Past medical history, social, surgical and family history all reviewed in electronic medical record.  No pertanent information unless stated regarding to the chief complaint.   Review of Systems: No headache, visual changes, nausea, vomiting, diarrhea, constipation, dizziness, abdominal pain, skin rash, fevers, chills, night sweats, weight loss, swollen lymph nodes, body aches, joint swelling, muscle aches, chest pain, shortness of breath, mood changes.     Objective  Blood pressure 96/86, pulse 87, resp. rate 16, weight 140 lb 6 oz (63.7 kg), SpO2 98 %.   Systems examined below as of 08/08/16 General: NAD A&O x3 mood, affect normal  HEENT: Pupils equal, extraocular movements intact no nystagmus Respiratory: not [place] of breath at rest or with speaking Cardiovascular: No lower extremity edema, non tender Skin: Warm dry intact with no signs of infection or rash on extremities or on axial skeleton. Abdomen: Soft nontender, no masses Neuro: Cranial nerves  intact, neurovascularly intact in all extremities with 2+ DTRs and 2+ pulses. Lymph: No lymphadenopathy appreciated today  Gait normal with good balance and coordination.  MSK: Non tender with full range of motion and good stability and symmetric strength and tone of shoulders, elbows, wrist,  knee hips  bilaterally.    Ankle:Right Patient's peroneal cyst is decrease in size from previous exam. Range of motion is full in all directions. Strength is 4/5 in all directions. Stable lateral and medial ligaments; squeeze test and kleiger test unremarkable; Talar dome nontender; No pain at base of 5th MT; No tenderness over cuboid; No tenderness over N spot or navicular prominence No tenderness on posterior aspects of lateral and medial malleolus Tenderness over the peroneal tendon is inferior and distal to the lateral malleolus. Negative tarsal tunnel tinel's Able to walk 4 steps. Contralateral ankle unremarkable   Hand exam shows the patient does have arthritic changes of multiple joints. Patient does have what appears to be a cystic mass over the PIP of the index finger. Nontender freely movable.  Procedure: Real-time Ultrasound Guided Injection of first index dorsal cyst Device: GE Logiq Q7 Ultrasound guided injection is preferred based studies that show increased duration, increased effect, greater accuracy, decreased procedural pain, increased response rate, and decreased cost with ultrasound guided versus blind injection.  Verbal informed consent obtained.  Time-out conducted.  Noted no overlying erythema, induration, or other signs of local infection.  Skin prepped in a sterile fashion.  Local anesthesia: Topical Ethyl chloride.  With sterile technique and under real time ultrasound guidance: With an 18-gauge needle patient was injected with a total of 0.5 mL of 0.5% Marcaine and gel-like fluid was removed. Patient was injected with 0.5 mL of Kenalog 40 mg/dL. Didn't displace. Minimal blood loss.  Completed without difficulty  Pain immediately resolved suggesting accurate placement of the medication.  Advised to call if fevers/chills, erythema, induration, drainage, or persistent bleeding.  Images permanently stored and available for review in the ultrasound unit.  Impression:  Technically successful ultrasound guided injection.        Impression and Recommendations:     This case required medical decision making of moderate complexity.      Note: This dictation was prepared with Dragon dictation along with smaller phrase technology. Any transcriptional errors that result from this process are unintentional.

## 2016-08-08 ENCOUNTER — Ambulatory Visit (INDEPENDENT_AMBULATORY_CARE_PROVIDER_SITE_OTHER): Payer: BLUE CROSS/BLUE SHIELD | Admitting: Family Medicine

## 2016-08-08 ENCOUNTER — Encounter: Payer: Self-pay | Admitting: Family Medicine

## 2016-08-08 ENCOUNTER — Ambulatory Visit: Payer: Self-pay

## 2016-08-08 VITALS — BP 96/86 | HR 87 | Resp 16 | Wt 140.4 lb

## 2016-08-08 DIAGNOSIS — M7671 Peroneal tendinitis, right leg: Secondary | ICD-10-CM

## 2016-08-08 DIAGNOSIS — M25571 Pain in right ankle and joints of right foot: Secondary | ICD-10-CM | POA: Diagnosis not present

## 2016-08-08 DIAGNOSIS — M79641 Pain in right hand: Secondary | ICD-10-CM | POA: Diagnosis not present

## 2016-08-08 DIAGNOSIS — M67441 Ganglion, right hand: Secondary | ICD-10-CM | POA: Diagnosis not present

## 2016-08-08 MED ORDER — DICLOFENAC SODIUM 2 % TD SOLN: 2.0000 g | Freq: Two times a day (BID) | TRANSDERMAL | 3 refills | Status: DC

## 2016-08-08 NOTE — Assessment & Plan Note (Signed)
Much better at this time. No change in management. Continue to monitor. Worsening symptoms again though is consider injection.

## 2016-08-08 NOTE — Patient Instructions (Signed)
small ganglion cyst on your finger Should get smaller over the next 2 weeks [employer]co orthotics "total support" online would be great  Turmeric 500mg  twice daily  And consider USP label Inversion table [employer] [place]ume)  See me again in 4 weeks.

## 2016-08-08 NOTE — Progress Notes (Signed)
Pre-visit discussion using our clinic review tool. No additional management support is needed unless otherwise documented below in the visit note.  

## 2016-08-08 NOTE — Assessment & Plan Note (Signed)
Patient had aspiration again today. Discuss avoiding chronic repetitive activities if possible. Patient likely will improve over the course the next 2 weeks. Worsening symptoms to come back sooner. Otherwise patient in follow-up with me as needed knowing that we can repeat every 10 weeks.

## 2016-08-15 ENCOUNTER — Encounter: Payer: BLUE CROSS/BLUE SHIELD | Admitting: Internal Medicine

## 2016-08-19 ENCOUNTER — Ambulatory Visit (INDEPENDENT_AMBULATORY_CARE_PROVIDER_SITE_OTHER): Payer: BLUE CROSS/BLUE SHIELD | Admitting: Family Medicine

## 2016-08-19 ENCOUNTER — Encounter: Payer: Self-pay | Admitting: Family Medicine

## 2016-08-19 DIAGNOSIS — J019 Acute sinusitis, unspecified: Secondary | ICD-10-CM | POA: Insufficient documentation

## 2016-08-19 DIAGNOSIS — M545 Low back pain, unspecified: Secondary | ICD-10-CM

## 2016-08-19 MED ORDER — RESTORA RX 60-1.25 MG PO CAPS: 1.0000 | ORAL_CAPSULE | Freq: Every day | ORAL | 5 refills | Status: DC

## 2016-08-19 MED ORDER — AMOXICILLIN-POT CLAVULANATE 875-125 MG PO TABS: 1.0000 | ORAL_TABLET | Freq: Two times a day (BID) | ORAL | 0 refills | Status: DC

## 2016-08-19 NOTE — Patient Instructions (Signed)

## 2016-08-19 NOTE — Assessment & Plan Note (Signed)
Has been ill for past week. Started with sore throat, malaise and over past 2 days has been notably worse with increased head congestion, chest congestion, malaise, fatigue, low grade fevers, chills. She describes an episode of aspiration of some tea right before the symptoms worsened. She is describing production of yellow phlegm. Is started on Augmentin 875 twice daily. Add Elderberry, vitamin C, zinc, Aged or black garlic, probiotics, Mucinex, increase hydration

## 2016-08-19 NOTE — Progress Notes (Signed)
Subjective:    Patient ID: Patricia Conley, female    DOB: 05-28-1950, 66 y.o.   MRN: [id number]  Chief Complaint  Patient presents with  . Cough    w/ chest congestion...x 1 week...productive yellow sputum...pt has tried OTC Mucinex and sudafed with minimal relief  . Nasal Congestion    yellow green mucous    HPI Patient is in today for evaluation of congestion and low back pain. Has been ill for past week. Started with sore throat, malaise and over past 2 days has been notably worse with increased head congestion, chest congestion, malaise, fatigue, low grade fevers, chills. She describes an episode of aspiration of some tea right before the symptoms worsened. She is describing production of yellow phlegm. She notes the back pain spikes with the coughing which is affecting her sleep. She has tried Sudafed and Mucinex which have been partially helpful. Denies CP/palp/SOB/HA/GI or GU c/o. Taking meds as prescribed.  Past Medical History:  Diagnosis Date  . Acetabular labrum tear    right/ per pt history  . Arthritis   . Gall bladder disease    per pt report  . Ganglion cyst   . GERD (gastroesophageal reflux disease)   . Hemorrhoids   . Low back pain 08/20/2016  . Menopause   . Osteoporosis   . Thyroid disease     Past Surgical History:  Procedure Laterality Date  . BREAST SURGERY  2002   cyst on Left breast  . CYSTECTOMY  1999   Right elbow  . HEMORRHOID BANDING  07/2014  . JOINT REPLACEMENT    . KNEE ARTHROSCOPY Left 09/10/2013   Procedure: LEFT MEDIAL AND LATERAL KNEE ARTHROSCOPY WITH MENISCAL DEBRIDEMENT ;  Surgeon: Gearlean Alf, MD;  Location: [facility];  Service: Orthopedics;  Laterality: Left;  . NASAL SINUS SURGERY  1995, 2002  . TOTAL HIP ARTHROPLASTY  09/12/2011   Procedure: TOTAL HIP ARTHROPLASTY ANTERIOR APPROACH;  Surgeon: Mauri Pole, MD;  Location: [facility];  Service: Orthopedics;  Laterality: Left;    Family History  Problem Relation Age of Onset  .  Stroke Mother   . Nephrolithiasis Mother   . Diabetes Mother   . Aneurysm Mother     x2  . Cancer Mother     lymphoma, breast cancer  . Depression Father   . Parkinsonism Father   . Drug abuse Father   . Mental illness Sister     paranoia    Social History   Social History  . Marital status: Divorced    Spouse name: N/A  . Number of children: N/A  . Years of education: N/A   Occupational History  . Accountant    Social History Main Topics  . Smoking status: Never Smoker  . Smokeless tobacco: Never Used  . Alcohol use 0.6 oz/week    1 Standard drinks or equivalent per week     Comment: once weekly or 2-3 drinks per month  . Drug use: No  . Sexual activity: Not Currently   Other Topics Concern  . Not on file   Social History Narrative  . No narrative on file    Outpatient Medications Prior to Visit  Medication Sig Dispense Refill  . alendronate (FOSAMAX) 70 MG tablet Take 1 tablet (70 mg total) by mouth every 7 (seven) days. Take with a full glass of water on an empty stomach. 4 tablet 11  . b complex vitamins capsule Take 1 capsule by mouth daily.    Marland Kitchen  Biotin 1 MG CAPS Take by mouth.    . cetirizine (ZYRTEC) 10 MG tablet Take 10 mg by mouth daily.    . Cholecalciferol (CVS VIT D 5000 HIGH-POTENCY PO) Take 5,000 Units by mouth daily.    . Diclofenac Sodium 2 % SOLN Place 2 g onto the skin 2 (two) times daily. 112 g 3  . EVENING PRIMROSE OIL PO Take by mouth.    . Famotidine (PEPCID PO) Take 10 mg by mouth 2 (two) times daily.    Marland Kitchen ibuprofen (ADVIL,MOTRIN) 600 MG tablet Take 1 tablet (600 mg total) by mouth every 8 (eight) hours as needed. 60 tablet 1  . ketoconazole (NIZORAL) 2 % shampoo   99  . Magnesium 500 MG CAPS Take 500 mg by mouth daily.    . methimazole (TAPAZOLE) 5 MG tablet Take 2.5 mg by mouth daily.  11  . Omega-3 Fatty Acids (OMEGA 3 PO) Take 1 capsule by mouth 2 (two) times daily.    . Potassium 99 MG TABS Take 99 mg by mouth daily.    . Theanine  100 MG CAPS Take 100 mg by mouth 2 (two) times daily.    . valACYclovir (VALTREX) 500 MG tablet Take 1 tablet (500 mg total) by mouth daily. 90 tablet 1  . vitamin C (ASCORBIC ACID) 500 MG tablet Take 500 mg by mouth daily.    Marland Kitchen zinc gluconate 50 MG tablet Take 50 mg by mouth daily.    Mathis Bud RX 60-1.25 MG CAPS Take 1 tablet by mouth daily.  11   No facility-administered medications prior to visit.     Allergies  Allergen Reactions  . Histamine Rash    Flushing    Review of Systems  Constitutional: Positive for fever and malaise/fatigue.  HENT: Positive for congestion and sinus pain.   Eyes: Negative for blurred vision.  Respiratory: Positive for cough and sputum production. Negative for shortness of breath.   Cardiovascular: Negative for chest pain, palpitations and leg swelling.  Gastrointestinal: Negative for abdominal pain, blood in stool and nausea.  Genitourinary: Negative for dysuria and frequency.  Musculoskeletal: Positive for back pain and myalgias. Negative for falls.  Skin: Positive for rash.  Neurological: Negative for dizziness, loss of consciousness and headaches.  Endo/Heme/Allergies: Negative for environmental allergies.  Psychiatric/Behavioral: Negative for depression. The patient is not nervous/anxious.        Objective:    Physical Exam  Constitutional: She is oriented to person, place, and time. She appears well-developed and well-nourished. No distress.  HENT:  Head: Normocephalic and atraumatic.  Nose: Nose normal.  Dry mucus membranes, boggy nasal mucosa b/l  Eyes: Right eye exhibits no discharge. Left eye exhibits no discharge.  Neck: Normal range of motion. Neck supple.  Cardiovascular: Normal rate and regular rhythm.   No murmur heard. Pulmonary/Chest: Effort normal and breath sounds normal.  Abdominal: Soft. Bowel sounds are normal. There is no tenderness.  Musculoskeletal: She exhibits no edema.  Lymphadenopathy:    She has cervical  adenopathy.  Neurological: She is alert and oriented to person, place, and time.  Skin: Skin is warm and dry.  Psychiatric: She has a normal mood and affect.  Nursing note and vitals reviewed.   BP 118/76   Pulse 73   Temp 97.7 F (36.5 C) (Oral)   Wt 140 lb 12 oz (63.8 kg)   SpO2 98%   BMI 22.04 kg/m  Wt Readings from Last 3 Encounters:  08/19/16 140 lb 12 oz (  63.8 kg)  08/08/16 140 lb 6 oz (63.7 kg)  07/07/16 139 lb (63 kg)     Lab Results  Component Value Date   WBC 7.5 05/04/2015   HGB 13.7 05/04/2015   HCT 42.1 05/04/2015   PLT 276.0 05/04/2015   GLUCOSE 94 05/04/2015   CHOL 161 08/17/2014   TRIG 81 08/17/2014   HDL 74 08/17/2014   LDLCALC 71 08/17/2014   ALT 16 05/04/2015   ALT 16 05/04/2015   AST 22 05/04/2015   AST 22 05/04/2015   NA 141 05/04/2015   K 4.6 05/04/2015   CL 102 05/04/2015   CREATININE 0.78 05/04/2015   BUN 16 05/04/2015   CO2 31 05/04/2015   TSH 1.12 11/16/2015   INR 0.98 11/20/2012    Lab Results  Component Value Date   TSH 1.12 11/16/2015   Lab Results  Component Value Date   WBC 7.5 05/04/2015   HGB 13.7 05/04/2015   HCT 42.1 05/04/2015   MCV 88.8 05/04/2015   PLT 276.0 05/04/2015   Lab Results  Component Value Date   NA 141 05/04/2015   K 4.6 05/04/2015   CO2 31 05/04/2015   GLUCOSE 94 05/04/2015   BUN 16 05/04/2015   CREATININE 0.78 05/04/2015   BILITOT 0.9 05/04/2015   BILITOT 0.9 05/04/2015   ALKPHOS 68 05/04/2015   ALKPHOS 68 05/04/2015   AST 22 05/04/2015   AST 22 05/04/2015   ALT 16 05/04/2015   ALT 16 05/04/2015   PROT 7.7 05/04/2015   PROT 7.7 05/04/2015   ALBUMIN 5.0 05/04/2015   ALBUMIN 5.0 05/04/2015   CALCIUM 10.3 05/04/2015   ANIONGAP 5 09/26/2014   GFR 79.49 05/04/2015   Lab Results  Component Value Date   CHOL 161 08/17/2014   Lab Results  Component Value Date   HDL 74 08/17/2014   Lab Results  Component Value Date   LDLCALC 71 08/17/2014   Lab Results  Component Value Date    TRIG 81 08/17/2014   Lab Results  Component Value Date   CHOLHDL 2.2 08/17/2014   No results found for: HGBA1C     Assessment & Plan:   Problem List Items Addressed This Visit    Sinusitis, acute    Has been ill for past week. Started with sore throat, malaise and over past 2 days has been notably worse with increased head congestion, chest congestion, malaise, fatigue, low grade fevers, chills. She describes an episode of aspiration of some tea right before the symptoms worsened. She is describing production of yellow phlegm. Is started on Augmentin 875 twice daily. Add Elderberry, vitamin C, zinc, Aged or black garlic, probiotics, Mucinex, increase hydration      Relevant Medications   amoxicillin-clavulanate (AUGMENTIN) 875-125 MG tablet   Low back pain    Encouraged moist heat and gentle stretching as tolerated. May try NSAIDs and prescription meds as directed and report if symptoms worsen or seek immediate care         I am having Patricia Conley start on amoxicillin-clavulanate. I am also having her maintain her Magnesium, vitamin C, Potassium, b complex vitamins, Cholecalciferol (CVS VIT D 5000 HIGH-POTENCY PO), Omega-3 Fatty Acids (OMEGA 3 PO), zinc gluconate, EVENING PRIMROSE OIL PO, Biotin, Theanine, methimazole, ketoconazole, cetirizine, Famotidine (PEPCID PO), valACYclovir, alendronate, ibuprofen, Diclofenac Sodium, and RESTORA RX.  Meds ordered this encounter  Medications  . amoxicillin-clavulanate (AUGMENTIN) 875-125 MG tablet    Sig: Take 1 tablet by mouth 2 (two) times daily.  Dispense:  20 tablet    Refill:  0  . RESTORA RX 60-1.25 MG CAPS    Sig: Take 1 tablet by mouth daily.    Dispense:  30 capsule    Refill:  5     Penni Homans, MD

## 2016-08-20 ENCOUNTER — Encounter: Payer: Self-pay | Admitting: Family Medicine

## 2016-08-20 DIAGNOSIS — M545 Low back pain, unspecified: Secondary | ICD-10-CM

## 2016-08-20 HISTORY — DX: Low back pain, unspecified: M54.50

## 2016-08-20 NOTE — Assessment & Plan Note (Signed)
Encouraged moist heat and gentle stretching as tolerated. May try NSAIDs and prescription meds as directed and report if symptoms worsen or seek immediate care 

## 2016-09-05 ENCOUNTER — Encounter: Payer: BLUE CROSS/BLUE SHIELD | Admitting: Internal Medicine

## 2016-09-13 ENCOUNTER — Ambulatory Visit: Payer: BLUE CROSS/BLUE SHIELD | Admitting: Pulmonary Disease

## 2016-09-14 ENCOUNTER — Ambulatory Visit (INDEPENDENT_AMBULATORY_CARE_PROVIDER_SITE_OTHER): Payer: BLUE CROSS/BLUE SHIELD | Admitting: Nurse Practitioner

## 2016-09-14 ENCOUNTER — Encounter: Payer: Self-pay | Admitting: Nurse Practitioner

## 2016-09-14 VITALS — BP 106/66 | HR 85 | Temp 98.1°F | Ht 67.0 in | Wt 140.0 lb

## 2016-09-14 DIAGNOSIS — R0981 Nasal congestion: Secondary | ICD-10-CM

## 2016-09-14 DIAGNOSIS — R058 Other specified cough: Secondary | ICD-10-CM

## 2016-09-14 DIAGNOSIS — R05 Cough: Secondary | ICD-10-CM

## 2016-09-14 MED ORDER — AZITHROMYCIN 250 MG PO TABS: 250.0000 mg | ORAL_TABLET | Freq: Every day | ORAL | 0 refills | Status: DC

## 2016-09-14 MED ORDER — SALINE SPRAY 0.65 % NA SOLN: 1.0000 | NASAL | 0 refills | Status: DC | PRN

## 2016-09-14 MED ORDER — FLUTICASONE PROPIONATE 50 MCG/ACT NA SUSP: 2.0000 | Freq: Every day | NASAL | 0 refills | Status: DC

## 2016-09-14 MED ORDER — GUAIFENESIN ER 600 MG PO TB12
600.0000 mg | ORAL_TABLET | Freq: Two times a day (BID) | ORAL | 0 refills | Status: DC | PRN
Start: 2016-09-14 — End: 2017-07-30

## 2016-09-14 NOTE — Patient Instructions (Addendum)
You may also use saline sinus irrigation once a day as needed.  URI Instructions: Flonase and Afrin use: apply 1spray of afrin in each nare, wait 21mins, then apply 2sprays of flonase in each nare. Use both nasal spray consecutively x 3days, then flonase only for at least 14days.  Encourage adequate oral hydration.  Use over-the-counter  "cold" medicines  such as "Tylenol cold" , "Advil cold",  "Mucinex" or" Mucinex D"  for cough and congestion.  Avoid decongestants if you have high blood pressure. Use" Delsym" or" Robitussin" cough syrup varietis for cough.  You can use plain "Tylenol" or "Advi"l for fever, chills and achyness.   "Common cold" symptoms are usually triggered by a virus.  The antibiotics are usually not necessary. On average, a" viral cold" illness would take 4-7 days to resolve. Please, make an appointment if you are not better or if you're worse.  Fill azithromycin prescription if no improvement in 3days.
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
## 2016-09-20 ENCOUNTER — Other Ambulatory Visit (INDEPENDENT_AMBULATORY_CARE_PROVIDER_SITE_OTHER): Payer: BLUE CROSS/BLUE SHIELD

## 2016-09-20 ENCOUNTER — Encounter: Payer: Self-pay | Admitting: Internal Medicine

## 2016-09-20 ENCOUNTER — Ambulatory Visit (INDEPENDENT_AMBULATORY_CARE_PROVIDER_SITE_OTHER): Payer: BLUE CROSS/BLUE SHIELD | Admitting: Internal Medicine

## 2016-09-20 VITALS — BP 118/68 | HR 79 | Temp 97.9°F | Resp 16 | Ht 67.0 in | Wt 141.0 lb

## 2016-09-20 DIAGNOSIS — Z0000 Encounter for general adult medical examination without abnormal findings: Secondary | ICD-10-CM | POA: Diagnosis not present

## 2016-09-20 DIAGNOSIS — E0520 Thyrotoxicosis with toxic multinodular goiter without thyrotoxic crisis or storm: Secondary | ICD-10-CM

## 2016-09-20 DIAGNOSIS — M810 Age-related osteoporosis without current pathological fracture: Secondary | ICD-10-CM | POA: Diagnosis not present

## 2016-09-20 DIAGNOSIS — H04129 Dry eye syndrome of unspecified lacrimal gland: Secondary | ICD-10-CM

## 2016-09-20 LAB — CBC WITH DIFFERENTIAL/PLATELET
BASOS PCT: 0.9 % (ref 0.0–3.0)
Basophils Absolute: 0.1 10*3/uL (ref 0.0–0.1)
Eosinophils Absolute: 0.1 10*3/uL (ref 0.0–0.7)
Eosinophils Relative: 2 % (ref 0.0–5.0)
HCT: 39.4 % (ref 36.0–46.0)
HEMOGLOBIN: 13.3 g/dL (ref 12.0–15.0)
LYMPHS ABS: 1.8 10*3/uL (ref 0.7–4.0)
Lymphocytes Relative: 30.9 % (ref 12.0–46.0)
MCHC: 33.7 g/dL (ref 30.0–36.0)
MCV: 87.4 fl (ref 78.0–100.0)
MONO ABS: 0.4 10*3/uL (ref 0.1–1.0)
Monocytes Relative: 7 % (ref 3.0–12.0)
Neutro Abs: 3.5 10*3/uL (ref 1.4–7.7)
Neutrophils Relative %: 59.2 % (ref 43.0–77.0)
Platelets: 235 10*3/uL (ref 150.0–400.0)
RBC: 4.5 Mil/uL (ref 3.87–5.11)
RDW: 13.6 % (ref 11.5–15.5)
WBC: 5.9 10*3/uL (ref 4.0–10.5)

## 2016-09-20 LAB — COMPREHENSIVE METABOLIC PANEL
ALBUMIN: 4.9 g/dL (ref 3.5–5.2)
ALT: 18 U/L (ref 0–35)
AST: 22 U/L (ref 0–37)
Alkaline Phosphatase: 49 U/L (ref 39–117)
BUN: 13 mg/dL (ref 6–23)
CALCIUM: 9.6 mg/dL (ref 8.4–10.5)
CHLORIDE: 103 meq/L (ref 96–112)
CO2: 33 mEq/L — ABNORMAL HIGH (ref 19–32)
CREATININE: 0.75 mg/dL (ref 0.40–1.20)
GFR: 82.8 mL/min (ref >60.00)
Glucose, Bld: 80 mg/dL (ref 70–99)
POTASSIUM: 4.8 meq/L (ref 3.5–5.1)
Sodium: 141 mEq/L (ref 135–145)
Total Bilirubin: 0.9 mg/dL (ref 0.2–1.2)
Total Protein: 7.1 g/dL (ref 6.0–8.3)

## 2016-09-20 LAB — LIPID PANEL
CHOLESTEROL: 173 mg/dL (ref 0–200)
HDL: 66.9 mg/dL (ref >39.00)
LDL Cholesterol: 87 mg/dL (ref 0–99)
NonHDL: 105.96
Total CHOL/HDL Ratio: 3
Triglycerides: 97 mg/dL (ref 0.0–149.0)
VLDL: 19.4 mg/dL (ref 0.0–40.0)

## 2016-09-20 LAB — T3, FREE: T3, Free: 3.6 pg/mL (ref 2.3–4.2)

## 2016-09-20 LAB — T4, FREE: Free T4: 0.83 ng/dL (ref 0.60–1.60)

## 2016-09-20 LAB — TSH: TSH: 1.31 u[IU]/mL (ref 0.35–4.50)

## 2016-09-20 MED ORDER — RESTORA RX 60-1.25 MG PO CAPS: 1.0000 | ORAL_CAPSULE | Freq: Every day | ORAL | 11 refills | Status: DC

## 2016-09-20 MED ORDER — ALENDRONATE SODIUM 70 MG PO TABS: 70.0000 mg | ORAL_TABLET | ORAL | 11 refills | Status: DC

## 2016-09-20 NOTE — Patient Instructions (Addendum)
Test(s) ordered today. Your results will be released to [facility] (or called to you) after review, usually within 72hours after test completion. If any changes need to be made, you will be notified at that same time.  All other Health Maintenance issues reviewed.   All recommended immunizations and age-appropriate screenings are up-to-date or discussed.  No immunizations administered today. Consider getting a shingles vaccine.   Medications reviewed and updated.  No changes recommended at this time.  Your prescription(s) have been submitted to your pharmacy. Please take as directed and contact our office if you believe you are having problem(s) with the medication(s).   Please followup in one year for a physical   Health Maintenance, Female Adopting a healthy lifestyle and getting preventive care can go a long way to promote health and wellness. Talk with your health care provider about what schedule of regular examinations is right for you. This is a good chance for you to check in with your provider about disease prevention and staying healthy. In between checkups, there are plenty of things you can do on your own. Experts have done a lot of research about which lifestyle changes and preventive measures are most likely to keep you healthy. Ask your health care provider for more information. Weight and diet Eat a healthy diet  Be sure to include plenty of vegetables, fruits, low-fat dairy products, and lean protein.  Do not eat a lot of foods high in solid fats, added sugars, or salt.  Get regular exercise. This is one of the most important things you can do for your health.  Most adults should exercise for at least 150 minutes each week. The exercise should increase your heart rate and make you sweat (moderate-intensity exercise).  Most adults should also do strengthening exercises at least twice a week. This is in addition to the moderate-intensity exercise. Maintain a healthy  weight  Body mass index (BMI) is a measurement that can be used to identify possible weight problems. It estimates body fat based on height and weight. Your health care provider can help determine your BMI and help you achieve or maintain a healthy weight.  For females 44 years of age and older:  A BMI below 18.5 is considered underweight.  A BMI of 18.5 to 24.9 is normal.  A BMI of 25 to 29.9 is considered overweight.  A BMI of 30 and above is considered obese. Watch levels of cholesterol and blood lipids  You should start having your blood tested for lipids and cholesterol at 64 years of age, then have this test every 5 years.  You may need to have your cholesterol levels checked more often if:  Your lipid or cholesterol levels are high.  You are older than 64 years of age.  You are at high risk for heart disease. Cancer screening Lung Cancer  Lung cancer screening is recommended for adults 65-35 years old who are at high risk for lung cancer because of a history of smoking.  A yearly low-dose CT scan of the lungs is recommended for people who:  Currently smoke.  Have quit within the past 15 years.  Have at least a 30-pack-year history of smoking. A pack year is smoking an average of one pack of cigarettes a day for 1 year.  Yearly screening should continue until it has been 15 years since you quit.  Yearly screening should stop if you develop a health problem that would prevent you from having lung cancer treatment. Breast Cancer  Practice breast self-awareness. This means understanding how your breasts normally appear and feel.  It also means doing regular breast self-exams. Let your health care provider know about any changes, no matter how small.  If you are in your 20s or 30s, you should have a clinical breast exam (CBE) by a health care provider every 1-3 years as part of a regular health exam.  If you are 38 or older, have a CBE every year. Also consider  having a breast X-ray (mammogram) every year.  If you have a family history of breast cancer, talk to your health care provider about genetic screening.  If you are at high risk for breast cancer, talk to your health care provider about having an MRI and a mammogram every year.  Breast cancer gene (BRCA) assessment is recommended for women who have family members with BRCA-related cancers. BRCA-related cancers include:  Breast.  Ovarian.  Tubal.  Peritoneal cancers.  Results of the assessment will determine the need for genetic counseling and BRCA1 and BRCA2 testing. Cervical Cancer  Your health care provider may recommend that you be screened regularly for cancer of the pelvic organs (ovaries, uterus, and vagina). This screening involves a pelvic examination, including checking for microscopic changes to the surface of your cervix (Pap test). You may be encouraged to have this screening done every 3 years, beginning at age 26.  For women ages 65-65, health care providers may recommend pelvic exams and Pap testing every 3 years, or they may recommend the Pap and pelvic exam, combined with testing for human papilloma virus (HPV), every 5 years. Some types of HPV increase your risk of cervical cancer. Testing for HPV may also be done on women of any age with unclear Pap test results.  Other health care providers may not recommend any screening for nonpregnant women who are considered low risk for pelvic cancer and who do not have symptoms. Ask your health care provider if a screening pelvic exam is right for you.  If you have had past treatment for cervical cancer or a condition that could lead to cancer, you need Pap tests and screening for cancer for at least 20 years after your treatment. If Pap tests have been discontinued, your risk factors (such as having a new sexual partner) need to be reassessed to determine if screening should resume. Some women have medical problems that increase the  chance of getting cervical cancer. In these cases, your health care provider may recommend more frequent screening and Pap tests. Colorectal Cancer  This type of cancer can be detected and often prevented.  Routine colorectal cancer screening usually begins at 64 years of age and continues through 64 years of age.  Your health care provider may recommend screening at an earlier age if you have risk factors for colon cancer.  Your health care provider may also recommend using home test kits to check for hidden blood in the stool.  A small camera at the end of a tube can be used to examine your colon directly (sigmoidoscopy or colonoscopy). This is done to check for the earliest forms of colorectal cancer.  Routine screening usually begins at age 29.  Direct examination of the colon should be repeated every 5-10 years through 64 years of age. However, you may need to be screened more often if early forms of precancerous polyps or small growths are found. Skin Cancer  Check your skin from head to toe regularly.  Tell your health care provider about  any new moles or changes in moles, especially if there is a change in a mole's shape or color.  Also tell your health care provider if you have a mole that is larger than the size of a pencil eraser.  Always use sunscreen. Apply sunscreen liberally and repeatedly throughout the day.  Protect yourself by wearing long sleeves, pants, a wide-brimmed hat, and sunglasses whenever you are outside. Heart disease, diabetes, and high blood pressure  High blood pressure causes heart disease and increases the risk of stroke. High blood pressure is more likely to develop in:  People who have blood pressure in the high end of the normal range (130-139/85-89 mm Hg).  People who are overweight or obese.  People who are African American.  If you are 75-78 years of age, have your blood pressure checked every 3-5 years. If you are 54 years of age or older,  have your blood pressure checked every year. You should have your blood pressure measured twice-once when you are at a hospital or clinic, and once when you are not at a hospital or clinic. Record the average of the two measurements. To check your blood pressure when you are not at a hospital or clinic, you can use:  An automated blood pressure machine at a pharmacy.  A home blood pressure monitor.  If you are between 84 years and 79 years old, ask your health care provider if you should take aspirin to prevent strokes.  Have regular diabetes screenings. This involves taking a blood sample to check your fasting blood sugar level.  If you are at a normal weight and have a low risk for diabetes, have this test once every three years after 64 years of age.  If you are overweight and have a high risk for diabetes, consider being tested at a younger age or more often. Preventing infection Hepatitis B  If you have a higher risk for hepatitis B, you should be screened for this virus. You are considered at high risk for hepatitis B if:  You were born in a country where hepatitis B is common. Ask your health care provider which countries are considered high risk.  Your parents were born in a high-risk country, and you have not been immunized against hepatitis B (hepatitis B vaccine).  You have HIV or AIDS.  You use needles to inject street drugs.  You live with someone who has hepatitis B.  You have had sex with someone who has hepatitis B.  You get hemodialysis treatment.  You take certain medicines for conditions, including cancer, organ transplantation, and autoimmune conditions. Hepatitis C  Blood testing is recommended for:  Everyone born from [birth year] through 1963.  Anyone with known risk factors for hepatitis C. Sexually transmitted infections (STIs)  You should be screened for sexually transmitted infections (STIs) including gonorrhea and chlamydia if:  You are sexually active  and are younger than 64 years of age.  You are older than 64 years of age and your health care provider tells you that you are at risk for this type of infection.  Your sexual activity has changed since you were last screened and you are at an increased risk for chlamydia or gonorrhea. Ask your health care provider if you are at risk.  If you do not have HIV, but are at risk, it may be recommended that you take a prescription medicine daily to prevent HIV infection. This is called pre-exposure prophylaxis (PrEP). You are considered at risk if:  You are sexually active and do not regularly use condoms or know the HIV status of your partner(s).  You take drugs by injection.  You are sexually active with a partner who has HIV. Talk with your health care provider about whether you are at high risk of being infected with HIV. If you choose to begin PrEP, you should first be tested for HIV. You should then be tested every 3 months for as long as you are taking PrEP. Pregnancy  If you are premenopausal and you may become pregnant, ask your health care provider about preconception counseling.  If you may become pregnant, take 400 to 800 micrograms (mcg) of folic acid every day.  If you want to prevent pregnancy, talk to your health care provider about birth control (contraception). Osteoporosis and menopause  Osteoporosis is a disease in which the bones lose minerals and strength with aging. This can result in serious bone fractures. Your risk for osteoporosis can be identified using a bone density scan.  If you are 73 years of age or older, or if you are at risk for osteoporosis and fractures, ask your health care provider if you should be screened.  Ask your health care provider whether you should take a calcium or vitamin D supplement to lower your risk for osteoporosis.  Menopause may have certain physical symptoms and risks.  Hormone replacement therapy may reduce some of these symptoms  and risks. Talk to your health care provider about whether hormone replacement therapy is right for you. Follow these instructions at home:  Schedule regular health, dental, and eye exams.  Stay current with your immunizations.  Do not use any tobacco products including cigarettes, chewing tobacco, or electronic cigarettes.  If you are pregnant, do not drink alcohol.  If you are breastfeeding, limit how much and how often you drink alcohol.  Limit alcohol intake to no more than 1 drink per day for nonpregnant women. One drink equals 12 ounces of beer, 5 ounces of wine, or 1 ounces of hard liquor.  Do not use street drugs.  Do not share needles.  Ask your health care provider for help if you need support or information about quitting drugs.  Tell your health care provider if you often feel depressed.  Tell your health care provider if you have ever been abused or do not feel safe at home. This information is not intended to replace advice given to you by your health care provider. Make sure you discuss any questions you have with your health care provider. Document Released: 10/24/2010 Document Revised: 09/16/2015 Document Reviewed: 01/12/2015 Elsevier Interactive Patient Education  2017 [employer].

## 2016-09-20 NOTE — Assessment & Plan Note (Signed)
Blood work neg in past Has dry eyes/mouth Will ask derm and ent if either would do a biopsy to r/o sjogren's which would be more definitive

## 2016-09-20 NOTE — Progress Notes (Signed)
Subjective:    Patient ID: Patricia Conley, female    DOB: 1950/12/27, 66 y.o.   MRN: [id number]  HPI She is here for a physical exam.   Rash between breasts that has been there intermittently.  It is back and worse.  She has seen derm.  She was given a cream.  It helped a little, but is still there.  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  It did clear up with taking the Augmentin.  It does itch at times - the cream has helped that.   She has dry eyes and dry mouth and many doctors have asked if she has Sjogren's.  She had the blood test which was negative, but never had a biopsy.  She would like to have one.   Medications and allergies reviewed with patient and updated if appropriate.  Patient Active Problem List   Diagnosis Date Noted  . Low back pain 08/20/2016  . Ganglion cyst of finger of right hand 08/08/2016  . Peroneal tendinitis, right leg 06/12/2016  . Loss of transverse plantar arch 06/12/2016  . Mass of joint of finger 04/16/2016  . Right ankle pain 04/12/2016  . Pap smear abnormality of cervix with ASCUS favoring benign 02/08/2016  . Snoring 12/28/2015  . Rash and nonspecific skin eruption 11/06/2015  . Palpitations 08/05/2015  . 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 Abdominal pain, left upper quadrant 05/19/2015  . Osteoporosis 03/25/2015  . Dry eye 03/25/2015  . Elevated urinary free cortisol level 10/29/2014  . Hair loss 09/18/2014  . 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 Acute medial meniscal tear 09/10/2013  . Abnormal TSH 08/25/2013  . Bunion of great toe 11/15/2012  . Arthralgia of ankle or foot 11/15/2012  . DJD (degenerative joint disease) 11/11/2012  . History of gout 11/11/2012  . GERD (gastroesophageal reflux disease) 10/27/2012  . Vaginal bleeding 04/30/2012  . S/P left THA, AA 09/12/2011  . Hemorrhoids, external without complications 16/60/6301  . Renal cyst 12/15/2010  . Adrenal nodule ([place]) 12/15/2010  . Liver hemangioma  12/15/2010  . Benign breast cyst in female   . Menopause   . Gall bladder disease     Current Outpatient Prescriptions on File Prior to Visit  Medication Sig Dispense Refill  . alendronate (FOSAMAX) 70 MG tablet Take 1 tablet (70 mg total) by mouth every 7 (seven) days. Take with a full glass of water on an empty stomach. 4 tablet 11  . b complex vitamins capsule Take 1 capsule by mouth daily.    . Biotin 1 MG CAPS Take by mouth.    . cetirizine (ZYRTEC) 10 MG tablet Take 10 mg by mouth daily.    . Cholecalciferol ([employer] VIT D 5000 HIGH-POTENCY PO) Take 5,000 Units by mouth daily.    . Diclofenac Sodium 2 % SOLN Place 2 g onto the skin 2 (two) times daily. 112 g 3  . EVENING PRIMROSE OIL PO Take by mouth.    . Famotidine (PEPCID PO) Take 10 mg by mouth 2 (two) times daily.    . fluticasone (FLONASE) 50 MCG/ACT nasal spray Place 2 sprays into both nostrils daily. 16 g 0  . guaiFENesin (MUCINEX) 600 MG 12 hr tablet Take 1 tablet (600 mg total) by mouth 2 (two) times daily as needed for cough or to loosen phlegm. 14 tablet 0  . ibuprofen (ADVIL,MOTRIN) 600 MG tablet Take 1 tablet (600 mg total) by mouth every 8 (eight) hours as needed. [place]  tablet 1  . ketoconazole (NIZORAL) 2 % shampoo   99  . Magnesium 500 MG CAPS Take 500 mg by mouth daily.    . methimazole (TAPAZOLE) 5 MG tablet Take 2.5 mg by mouth daily.  11  . Omega-3 Fatty Acids (OMEGA 3 PO) Take 1 capsule by mouth 2 (two) times daily.    [place] Bicarbonate (ZEGERID PO) Take by mouth.    . Potassium 99 MG TABS Take 99 mg by mouth daily.    Mathis Bud RX 60-1.25 MG CAPS Take 1 tablet by mouth daily. 30 capsule 5  . sodium chloride (OCEAN) 0.65 % SOLN nasal spray Place 1 spray into both nostrils as needed for congestion. 15 mL 0  . Theanine 100 MG CAPS Take 100 mg by mouth 2 (two) times daily.    . valACYclovir (VALTREX) 500 MG tablet Take 1 tablet (500 mg total) by mouth daily. 90 tablet 1  . vitamin C (ASCORBIC ACID) 500 MG  tablet Take 500 mg by mouth daily.    Marland Kitchen zinc gluconate 50 MG tablet Take 50 mg by mouth daily.     No current facility-administered medications on file prior to visit.     Past Medical History:  Diagnosis Date  . Acetabular labrum tear    right/ per pt history  . Arthritis   . Gall bladder disease    per pt report  . Ganglion cyst   . GERD (gastroesophageal reflux disease)   . Hemorrhoids   . Low back pain 08/20/2016  . Menopause   . Osteoporosis   . Thyroid disease     Past Surgical History:  Procedure Laterality Date  . BREAST SURGERY  2002   cyst on Left breast  . CYSTECTOMY  1999   Right elbow  . HEMORRHOID BANDING  07/2014  . JOINT REPLACEMENT    . KNEE ARTHROSCOPY Left 09/10/2013   Procedure: LEFT MEDIAL AND LATERAL KNEE ARTHROSCOPY WITH MENISCAL DEBRIDEMENT ;  Surgeon: Gearlean Alf, MD;  Location: [facility];  Service: Orthopedics;  Laterality: Left;  . NASAL SINUS SURGERY  1995, 2002  . TOTAL HIP ARTHROPLASTY  09/12/2011   Procedure: TOTAL HIP ARTHROPLASTY ANTERIOR APPROACH;  Surgeon: Mauri Pole, MD;  Location: [facility];  Service: Orthopedics;  Laterality: Left;    Social History   Social History  . Marital status: Divorced    Spouse name: N/A  . Number of children: N/A  . Years of education: N/A   Occupational History  . Accountant    Social History Main Topics  . Smoking status: Never Smoker  . Smokeless tobacco: Never Used  . Alcohol use 0.6 oz/week    1 Standard drinks or equivalent per week     Comment: once weekly or 2-3 drinks per month  . Drug use: No  . Sexual activity: Not Currently   Other Topics Concern  . None   Social History Narrative  . None    Family History  Problem Relation Age of Onset  . Stroke Mother   . Nephrolithiasis Mother   . Diabetes Mother   . Aneurysm Mother        x2  . Cancer Mother        lymphoma, breast cancer  . Depression Father   . Parkinsonism Father   . Drug abuse Father   . Mental illness Sister         paranoia    Review of Systems  Constitutional: Negative for chills and  fever.  HENT:       Dry mouth  Eyes: Negative for visual disturbance.       Dry eyes  Respiratory: Negative for cough, shortness of breath and wheezing.   Cardiovascular: Positive for palpitations (rare). Negative for chest pain and leg swelling.  Gastrointestinal: Positive for abdominal pain (chronic discomfort in LUQ). Negative for blood in stool, constipation, diarrhea and nausea.       No gerd  Genitourinary: Negative for dysuria and hematuria.  Musculoskeletal: Positive for arthralgias and neck pain.  Skin: Positive for rash (on chest).  Neurological: Positive for headaches (sinus related). Negative for weakness, light-headedness and numbness.  Psychiatric/Behavioral: Positive for dysphoric mood (mild, waxing and waning - wants to avoid medication). The patient is not nervous/anxious.        Objective:   Vitals:   09/20/16 1517  BP: 118/68  Pulse: 79  Resp: 16  Temp: 97.9 F (36.6 C)   Filed Weights   09/20/16 1517  Weight: 141 lb (64 kg)   Body mass index is 22.08 kg/m.  Wt Readings from Last 3 Encounters:  09/20/16 141 lb (64 kg)  09/14/16 140 lb (63.5 kg)  08/19/16 140 lb 12 oz (63.8 kg)     Physical Exam Constitutional: She appears well-developed and well-nourished. No distress.  HENT:  Head: Normocephalic and atraumatic.  Right Ear: External ear normal. Normal ear canal and TM Left Ear: External ear normal.  Normal ear canal and TM Mouth/Throat: Oropharynx is clear and moist.  Eyes: Conjunctivae and EOM are normal.  Neck: Neck supple. No tracheal deviation present. No thyromegaly present.  No carotid bruit  Cardiovascular: Normal rate, regular rhythm and normal heart sounds.   No murmur heard.  No edema. Pulmonary/Chest: Effort normal and breath sounds normal. No respiratory distress. She has no wheezes. She has no rales.  Breast: deferred to Gyn Abdominal: Soft. She  exhibits no distension. There is no tenderness.  Lymphadenopathy: She has no cervical adenopathy.  Skin: Skin is warm and dry. She is not diaphoretic.  Psychiatric: She has a normal mood and affect. Her behavior is normal.         Assessment & Plan:   Physical exam: Screening blood work  ordered Immunizations  Up to date, discussed shingrix Colonoscopy  Up to date  Mammogram  Up to date  Gyn    Up to date  Dexa   Up to date - done 08/2015 - on fosamax x 1 year Eye exams    Up to date - Dr Marty Heck EKG  Last done 10/2015 Exercise  - not exercising regularly - was walking Weight  Normal BMI Skin  Rash on chest - try topical anti-acne or bacterial cleanser - sees derm Substance abuse  none  See Problem List for Assessment and Plan of chronic medical problems.  FU annually

## 2016-09-20 NOTE — Assessment & Plan Note (Signed)
dexa done 2017 - will repeat next year Continue fosamax - started one year ago Increase exercise

## 2016-09-20 NOTE — Assessment & Plan Note (Addendum)
Management per endocrine at [facility] Will check labs

## 2016-10-18 DIAGNOSIS — R682 Dry mouth, unspecified: Secondary | ICD-10-CM

## 2016-10-18 DIAGNOSIS — K117 Disturbances of salivary secretion: Secondary | ICD-10-CM | POA: Insufficient documentation

## 2016-10-18 DIAGNOSIS — J3089 Other allergic rhinitis: Secondary | ICD-10-CM | POA: Insufficient documentation

## 2016-11-14 LAB — HM MAMMOGRAPHY

## 2016-11-15 ENCOUNTER — Encounter: Payer: Self-pay | Admitting: Internal Medicine

## 2016-12-22 ENCOUNTER — Encounter: Payer: Self-pay | Admitting: Internal Medicine

## 2016-12-22 ENCOUNTER — Ambulatory Visit (INDEPENDENT_AMBULATORY_CARE_PROVIDER_SITE_OTHER): Payer: BLUE CROSS/BLUE SHIELD | Admitting: Internal Medicine

## 2016-12-22 ENCOUNTER — Other Ambulatory Visit (INDEPENDENT_AMBULATORY_CARE_PROVIDER_SITE_OTHER): Payer: BLUE CROSS/BLUE SHIELD

## 2016-12-22 DIAGNOSIS — E0520 Thyrotoxicosis with toxic multinodular goiter without thyrotoxic crisis or storm: Secondary | ICD-10-CM

## 2016-12-22 DIAGNOSIS — L650 Telogen effluvium: Secondary | ICD-10-CM

## 2016-12-22 DIAGNOSIS — F419 Anxiety disorder, unspecified: Secondary | ICD-10-CM | POA: Diagnosis not present

## 2016-12-22 LAB — CBC WITH DIFFERENTIAL/PLATELET
Basophils Absolute: 0.1 10*3/uL (ref 0.0–0.1)
Basophils Relative: 0.7 % (ref 0.0–3.0)
EOS PCT: 1.1 % (ref 0.0–5.0)
Eosinophils Absolute: 0.1 10*3/uL (ref 0.0–0.7)
HEMATOCRIT: 40.2 % (ref 36.0–46.0)
Hemoglobin: 13.4 g/dL (ref 12.0–15.0)
LYMPHS ABS: 1.7 10*3/uL (ref 0.7–4.0)
Lymphocytes Relative: 24.5 % (ref 12.0–46.0)
MCHC: 33.4 g/dL (ref 30.0–36.0)
MCV: 88.6 fl (ref 78.0–100.0)
MONOS PCT: 7.4 % (ref 3.0–12.0)
Monocytes Absolute: 0.5 10*3/uL (ref 0.1–1.0)
Neutro Abs: 4.5 10*3/uL (ref 1.4–7.7)
Neutrophils Relative %: 66.3 % (ref 43.0–77.0)
PLATELETS: 235 10*3/uL (ref 150.0–400.0)
RBC: 4.54 Mil/uL (ref 3.87–5.11)
RDW: 12.8 % (ref 11.5–15.5)
WBC: 6.9 10*3/uL (ref 4.0–10.5)

## 2016-12-22 LAB — SEDIMENTATION RATE: Sed Rate: 1 mm/hr (ref 0–30)

## 2016-12-22 LAB — C-REACTIVE PROTEIN: CRP: 0.2 mg/dL — AB (ref 0.5–20.0)

## 2016-12-22 MED ORDER — PREDNISONE 10 MG PO TABS: 10.0000 mg | ORAL_TABLET | Freq: Every day | ORAL | 0 refills | Status: AC

## 2016-12-22 NOTE — Assessment & Plan Note (Signed)
With situational worsening, d/w pt, reassured, will hold on further other tx or counseling at this time, consider support group

## 2016-12-22 NOTE — Progress Notes (Signed)
Subjective:    Patient ID: Patricia Conley, female    DOB: 1951/01/04, 66 y.o.   MRN: [id number]  HPI  Here to f/u for a "general practioner's view", very nervous and quite upset over several months persistent excessive hair loss up to "400 hairs per day."  Has seen Endo several times with adequate thyroid replacement.  Did see Derm at [facility] 8/27 with scalp bx c/w telogen effluvium.  Tx with topical steroid, topical antifungal, and topical minoxidil.  Pt has f/u sept 12.  Has been reading on the internet and talking with friends and is adamantly concerned about having "my inflammation markers" checked.  No fever, st , cough, cp, sob, abd pain or dysuria. Past Medical History:  Diagnosis Date  . Acetabular labrum tear    right/ per pt history  . Arthritis   . Gall bladder disease    per pt report  . Ganglion cyst   . GERD (gastroesophageal reflux disease)   . Hemorrhoids   . Low back pain 08/20/2016  . Menopause   . Osteoporosis   . Thyroid disease    Past Surgical History:  Procedure Laterality Date  . BREAST SURGERY  2002   cyst on Left breast  . CYSTECTOMY  1999   Right elbow  . HEMORRHOID BANDING  07/2014  . JOINT REPLACEMENT    . KNEE ARTHROSCOPY Left 09/10/2013   Procedure: LEFT MEDIAL AND LATERAL KNEE ARTHROSCOPY WITH MENISCAL DEBRIDEMENT ;  Surgeon: Gearlean Alf, MD;  Location: [facility];  Service: Orthopedics;  Laterality: Left;  . NASAL SINUS SURGERY  1995, 2002  . TOTAL HIP ARTHROPLASTY  09/12/2011   Procedure: TOTAL HIP ARTHROPLASTY ANTERIOR APPROACH;  Surgeon: Mauri Pole, MD;  Location: [facility];  Service: Orthopedics;  Laterality: Left;    reports that she has never smoked. She has never used smokeless tobacco. She reports that she drinks about 0.6 oz of alcohol per week . She reports that she does not use drugs. family history includes Aneurysm in her mother; Cancer in her mother; Depression in her father; Diabetes in her mother; Drug abuse in her father;  Mental illness in her sister; Nephrolithiasis in her mother; Parkinson's disease in her brother; Parkinsonism in her father; Stroke in her mother. Allergies  Allergen Reactions  . Histamine Rash    Flushing   Current Outpatient Prescriptions on File Prior to Visit  Medication Sig Dispense Refill  . alendronate (FOSAMAX) 70 MG tablet Take 1 tablet (70 mg total) by mouth every 7 (seven) days. Take with a full glass of water on an empty stomach. 4 tablet 11  . b complex vitamins capsule Take 1 capsule by mouth daily.    . Biotin 1 MG CAPS Take by mouth.    . cetirizine (ZYRTEC) 10 MG tablet Take 10 mg by mouth daily.    . Cholecalciferol (CVS VIT D 5000 HIGH-POTENCY PO) Take 5,000 Units by mouth daily.    . Diclofenac Sodium 2 % SOLN Place 2 g onto the skin 2 (two) times daily. 112 g 3  . EVENING PRIMROSE OIL PO Take by mouth.    . Famotidine (PEPCID PO) Take 10 mg by mouth 2 (two) times daily.    . fluticasone (FLONASE) 50 MCG/ACT nasal spray Place 2 sprays into both nostrils daily. 16 g 0  . guaiFENesin (MUCINEX) 600 MG 12 hr tablet Take 1 tablet (600 mg total) by mouth 2 (two) times daily as needed for cough or to  loosen phlegm. 14 tablet 0  . ibuprofen (ADVIL,MOTRIN) 600 MG tablet Take 1 tablet (600 mg total) by mouth every 8 (eight) hours as needed. 60 tablet 1  . ketoconazole (NIZORAL) 2 % shampoo   99  . Magnesium 500 MG CAPS Take 500 mg by mouth daily.    . methimazole (TAPAZOLE) 5 MG tablet Take 2.5 mg by mouth daily.  11  . Omega-3 Fatty Acids (OMEGA 3 PO) Take 1 capsule by mouth 2 (two) times daily.    Earney Navy Bicarbonate (ZEGERID PO) Take by mouth.    . Potassium 99 MG TABS Take 99 mg by mouth daily.    Mathis Bud RX 60-1.25 MG CAPS Take 1 tablet by mouth daily. 30 capsule 11  . sodium chloride (OCEAN) 0.65 % SOLN nasal spray Place 1 spray into both nostrils as needed for congestion. 15 mL 0  . Theanine 100 MG CAPS Take 100 mg by mouth 2 (two) times daily.    .  valACYclovir (VALTREX) 500 MG tablet Take 1 tablet (500 mg total) by mouth daily. 90 tablet 1  . vitamin C (ASCORBIC ACID) 500 MG tablet Take 500 mg by mouth daily.    Marland Kitchen zinc gluconate 50 MG tablet Take 50 mg by mouth daily.     No current facility-administered medications on file prior to visit.    Review of Systems  Constitutional: Negative for other unusual diaphoresis or sweats HENT: Negative for ear discharge or swelling Eyes: Negative for other worsening visual disturbances Respiratory: Negative for stridor or other swelling  Gastrointestinal: Negative for worsening distension or other blood Genitourinary: Negative for retention or other urinary change Musculoskeletal: Negative for other MSK pain or swelling Skin: Negative for color change or other new lesions Neurological: Negative for worsening tremors and other numbness  Psychiatric/Behavioral: Negative for worsening agitation or other fatigue All other system neg per pt    Objective:   Physical Exam BP 116/78   Pulse 81   Temp 98.3 F (36.8 C) (Oral)   Wt 137 lb (62.1 kg)   SpO2 97%   BMI 21.46 kg/m  VS noted,  Constitutional: Pt appears in NAD HENT: Head: NCAT.  Right Ear: External ear normal.  Left Ear: External ear normal.  Eyes: . Pupils are equal, round, and reactive to light. Conjunctivae and EOM are normal Nose: without d/c or deformity Neck: Neck supple. Gross normal ROM Cardiovascular: Normal rate and regular rhythm.   Pulmonary/Chest: Effort normal and breath sounds without rales or wheezing.  Neurological: Pt is alert. At baseline orientation, motor grossly intact Skin: Skin is warm. No rashes, other new lesions, no LE edema, scalp with thinning at side parietal temporal areas, no patches or erythema Psychiatric: Pt behavior is normal without agitation , 2+ nervous and fidgety, concerned and upset No other exam findings    Assessment & Plan:

## 2016-12-22 NOTE — Assessment & Plan Note (Signed)
Recent normal labs per pt report, cont same tx

## 2016-12-22 NOTE — Patient Instructions (Signed)
Please take all new medication as prescribed - the prednisone  Please continue all other medications as before  Please have the pharmacy call with any other refills you may need.  Please keep your appointments with your specialists as you may have planned - Dermatology on Sept 12  Please go to the LAB in the Basement (turn left off the elevator) for the tests to be done today  You will be contacted by phone if any changes need to be made immediately.  Otherwise, you will receive a letter about your results with an explanation, but please check with MyChart first.

## 2016-12-22 NOTE — Assessment & Plan Note (Addendum)
D/w pt, ok to cont tx as per dermatology, ok for esr, crp, cbc, and will add trial of prednisone 20 qd for 7 days, and f/u derm sept 12 as planned

## 2017-05-13 NOTE — Progress Notes (Signed)
Subjective:    Patient ID: Patricia Conley, female    DOB: [birth year], 67 y.o.   MRN: [id number]  HPI She is here for an acute visit.    Hair loss: she has had hair loss for a while.  She had a reprieve mid 2017 to spring of 2018.  The hair loss increased last year.  Her thyroid was tested and it was not the thyroid. Her scalp Tallen Schnorr and itches.  She started to have dandruff again.  She saw Derm in 11/2016. The heat would bother her scalp.  She was given topical treatments and gabapentin.  She started finasteride.  Certain foods or alcohol would set off her symptoms.  She tried taking the gabapentin during the day and it made her drowsy.  She wondered about amitriptyline.   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  She did have a biopsy at that time.  She was prescribed fluocinonide solution, ketoconazole shampoo and Rogaine foam, which she had been using previously.  She does have a probable histamine problem and has been taking pepcid and oral anti-histamine and that helped with inflammation in her LUQ and scalp issues. She stopped them for a while, but restarted them when her symptoms recurred.  She is taking them daily.      Medications and allergies reviewed with patient and updated if appropriate.  Patient Active Problem List   Diagnosis Date Noted  . 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 Anxiety 12/22/2016  . Low back pain 08/20/2016  . Ganglion cyst of finger of right hand 08/08/2016  . Peroneal tendinitis, right leg 06/12/2016  . Loss of transverse plantar arch 06/12/2016  . Mass of joint of finger 04/16/2016  . Right ankle pain 04/12/2016  . Pap smear abnormality of cervix with ASCUS favoring benign 02/08/2016  . Snoring 12/28/2015  . Rash and nonspecific skin eruption 11/06/2015  . Palpitations 08/05/2015  . 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  Abdominal pain, left upper quadrant 05/19/2015  . Osteoporosis 03/25/2015  . Dry eye 03/25/2015  . Elevated urinary free cortisol level 10/29/2014  . Hair loss 09/18/2014  . 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 Acute medial meniscal tear 09/10/2013  . Bunion of great toe 11/15/2012  . Arthralgia of ankle or foot 11/15/2012  . DJD (degenerative joint disease) 11/11/2012  . History of gout 11/11/2012  . GERD (gastroesophageal reflux disease) 10/27/2012  . Vaginal bleeding 04/30/2012  . S/P left THA, AA 09/12/2011  . Hemorrhoids, external without complications [id number]/[id number]  . Renal cyst 12/15/2010  . Adrenal nodule ([place]) 12/15/2010  . Liver hemangioma 12/15/2010  . Benign breast cyst in female   . Menopause   . Gall bladder disease     Current Outpatient Medications on File Prior to Visit  Medication Sig Dispense Refill  . alendronate (FOSAMAX) 70 MG tablet Take 1 tablet (70 mg total) by mouth every 7 (seven) days. Take with a full glass of water on an empty stomach. 4 tablet 11  . b complex vitamins capsule Take 1 capsule by mouth daily.    . Biotin 1 MG CAPS Take by mouth.    . cetirizine (ZYRTEC) 10 MG tablet Take 10 mg by mouth daily.    . Cholecalciferol (CVS VIT D 5000 HIGH-POTENCY PO) Take 5,000 Units by mouth daily.    . Diclofenac Sodium 2 % SOLN Place 2 g onto  the skin 2 (two) times daily. 112 g 3  . EVENING PRIMROSE OIL PO Take by mouth.    . Famotidine (PEPCID PO) Take 10 mg by mouth 2 (two) times daily.    . finasteride (PROSCAR) 5 MG tablet Take 2.5 mg by mouth daily.    . fluticasone (FLONASE) 50 MCG/ACT nasal spray Place 2 sprays into both nostrils daily. 16 g 0  . gabapentin (NEURONTIN) 300 MG capsule Take 300 mg by mouth daily.    Marland Kitchen guaiFENesin (MUCINEX) 600 MG 12 hr tablet Take 1 tablet (600 mg total) by mouth 2 (two) times daily as needed for cough or to loosen phlegm. 14 tablet 0  . ibuprofen (ADVIL,MOTRIN) 600 MG tablet Take 1 tablet (600 mg total) by mouth  every 8 (eight) hours as needed. 60 tablet 1  . ketoconazole (NIZORAL) 2 % shampoo   99  . Magnesium 500 MG CAPS Take 500 mg by mouth daily.    . methimazole (TAPAZOLE) 5 MG tablet Take 2.5 mg by mouth daily.  11  . Omega-3 Fatty Acids (OMEGA 3 PO) Take 1 capsule by mouth 2 (two) times daily.    Earney Navy Bicarbonate (ZEGERID PO) Take by mouth.    . Potassium 99 MG TABS Take 99 mg by mouth daily.    Mathis Bud RX 60-1.25 MG CAPS Take 1 tablet by mouth daily. 30 capsule 11  . sodium chloride (OCEAN) 0.65 % SOLN nasal spray Place 1 spray into both nostrils as needed for congestion. 15 mL 0  . Theanine 100 MG CAPS Take 100 mg by mouth 2 (two) times daily.    . valACYclovir (VALTREX) 500 MG tablet Take 1 tablet (500 mg total) by mouth daily. 90 tablet 1  . vitamin C (ASCORBIC ACID) 500 MG tablet Take 500 mg by mouth daily.    Marland Kitchen zinc gluconate 50 MG tablet Take 50 mg by mouth daily.     No current facility-administered medications on file prior to visit.     Past Medical History:  Diagnosis Date  . Acetabular labrum tear    right/ per pt history  . Arthritis   . Gall bladder disease    per pt report  . Ganglion cyst   . GERD (gastroesophageal reflux disease)   . Hemorrhoids   . Low back pain 08/20/2016  . Menopause   . Osteoporosis   . Thyroid disease     Past Surgical History:  Procedure Laterality Date  . BREAST SURGERY  2002   cyst on Left breast  . CYSTECTOMY  1999   Right elbow  . HEMORRHOID BANDING  07/2014  . JOINT REPLACEMENT    . KNEE ARTHROSCOPY Left 09/10/2013   Procedure: LEFT MEDIAL AND LATERAL KNEE ARTHROSCOPY WITH MENISCAL DEBRIDEMENT ;  Surgeon: Gearlean Alf, MD;  Location: [facility];  Service: Orthopedics;  Laterality: Left;  . NASAL SINUS SURGERY  1995, 2002  . TOTAL HIP ARTHROPLASTY  09/12/2011   Procedure: TOTAL HIP ARTHROPLASTY ANTERIOR APPROACH;  Surgeon: Mauri Pole, MD;  Location: [facility];  Service: Orthopedics;  Laterality: Left;    Social  History   Socioeconomic History  . Marital status: Divorced    Spouse name: None  . Number of children: None  . Years of education: None  . Highest education level: None  Social Needs  . Financial resource strain: None  . Food insecurity - worry: None  . Food insecurity - inability: None  . Transportation needs - medical: None  .  Transportation needs - non-medical: None  Occupational History  . Occupation: Optometrist  Tobacco Use  . Smoking status: Never Smoker  . Smokeless tobacco: Never Used  Substance and Sexual Activity  . Alcohol use: Yes    Alcohol/week: 0.6 oz    Types: 1 Standard drinks or equivalent per week    Comment: once weekly or 2-3 drinks per month  . Drug use: No  . Sexual activity: Not Currently  Other Topics Concern  . None  Social History Narrative  . None    Family History  Problem Relation Age of Onset  . Stroke Mother   . Nephrolithiasis Mother   . Diabetes Mother   . Aneurysm Mother        x2  . Cancer Mother        lymphoma, breast cancer  . Depression Father   . Parkinsonism Father   . Drug abuse Father   . Parkinson's disease Brother   . Mental illness Sister        paranoia    Review of Systems  Constitutional: Negative for chills and fever.  Gastrointestinal: Positive for abdominal pain (luq).  Musculoskeletal: Positive for arthralgias.  Skin: Negative for rash and wound.       Redness and dryness on scalp; ganglion cysts -hands and feet       Objective:   Vitals:   05/15/17 0859  BP: 130/76  Pulse: 86  Resp: 16  Temp: 98 F (36.7 C)  SpO2: 98%   Wt Readings from Last 3 Encounters:  05/15/17 140 lb (63.5 kg)  12/22/16 137 lb (62.1 kg)  09/20/16 141 lb (64 kg)   Body mass index is 21.93 kg/m.   Physical Exam  Constitutional: She appears well-developed and well-nourished. No distress.  HENT:  Head: Normocephalic and atraumatic.  Skin: Skin is warm and dry. No rash noted. She is not diaphoretic. There is  erythema (mild erythema of scalp, mild dermatatitis).  No localized hair loss; ganglion cuts fingers and feet  Psychiatric: She has a normal mood and affect. Her behavior is normal.         Assessment & Plan:    See Problem List for Assessment and Plan of chronic medical problems.

## 2017-05-15 ENCOUNTER — Encounter: Payer: Self-pay | Admitting: Internal Medicine

## 2017-05-15 ENCOUNTER — Ambulatory Visit: Payer: BLUE CROSS/BLUE SHIELD | Admitting: Internal Medicine

## 2017-05-15 VITALS — BP 130/76 | HR 86 | Temp 98.0°F | Resp 16 | Wt 140.0 lb

## 2017-05-15 DIAGNOSIS — R208 Other disturbances of skin sensation: Secondary | ICD-10-CM | POA: Insufficient documentation

## 2017-05-15 DIAGNOSIS — L659 Nonscarring hair loss, unspecified: Secondary | ICD-10-CM | POA: Diagnosis not present

## 2017-05-15 MED ORDER — AMITRIPTYLINE HCL 10 MG PO TABS: 10.0000 mg | ORAL_TABLET | Freq: Every day | ORAL | 3 refills | Status: DC

## 2017-05-15 NOTE — Assessment & Plan Note (Signed)
Has seen derm and endo Not related to thyroid Derm diagnosed her with chronic telogen effluvium, seborrheic dermatitis Continue topical medication prescribed by dermatology Keep a food log-she feels some of her inflammation in the scalp may be related to certain foods Wants to try amitriptyline for symptom relief-we will start with 10 mg daily

## 2017-05-15 NOTE — Patient Instructions (Addendum)
Medications reviewed and updated.  Changes include trying amitriptyline 10 mg at night.   Your prescription(s) have been submitted to your pharmacy. Please take as directed and contact our office if you believe you are having problem(s) with the medication(s).  If you do ok with the medication we can increase the dose if needed.

## 2017-05-15 NOTE — Assessment & Plan Note (Addendum)
Burning sensation of scalp Has seen derm - dx with chronic telogen effluvium, seborrheic dermatitis Intermittent symptoms - she feels the medication derm gave her helps Symptoms also seem to be related to food Advised her to keep food log Will try amitriptyline 10 mg nightly - can increase if tolerated Has tried gabapentin

## 2017-07-26 ENCOUNTER — Telehealth: Payer: Self-pay | Admitting: Pulmonary Disease

## 2017-07-26 NOTE — Telephone Encounter (Signed)
Called and spoke with patient regarding request of HST Pt last seen on 2017; had sleep study possible OSA Pt sleeping patterns worsen over the years and would like to be seen by VS  Scheduled appt for 07/30/2017 at 9am Nothing further needed

## 2017-07-30 ENCOUNTER — Ambulatory Visit: Payer: BLUE CROSS/BLUE SHIELD | Admitting: Pulmonary Disease

## 2017-07-30 ENCOUNTER — Encounter: Payer: Self-pay | Admitting: Pulmonary Disease

## 2017-07-30 VITALS — BP 114/64 | HR 83 | Ht 67.0 in | Wt 136.2 lb

## 2017-07-30 DIAGNOSIS — R0683 Snoring: Secondary | ICD-10-CM | POA: Diagnosis not present

## 2017-07-30 DIAGNOSIS — G4719 Other hypersomnia: Secondary | ICD-10-CM | POA: Diagnosis not present

## 2017-07-30 NOTE — Patient Instructions (Signed)
Will arrange for home sleep study Will call to arrange for follow up after sleep study reviewed  

## 2017-07-30 NOTE — Progress Notes (Signed)
[facility]  Chief Complaint  Patient presents with  . Sleep Consult    Pt had sleep study in 2017; pt gasps for air at times, cannot sleep on her back at all. Pt requesting HST.    Vital signs: BP 114/64 (BP Location: Left Arm, Cuff Size: Normal)   Pulse 83   Ht 5\' 7"  (1.702 m)   Wt 136 lb 3.2 oz (61.8 kg)   SpO2 100%   BMI 21.33 kg/m   History of Present Illness: Patricia Conley is a 67 y.o. female with daytime sleepiness and snoring.  I last saw her in 2017.  She had in lab sleep study.  Didn't meet criteria for diagnosis of sleep apnea.  Her symptoms have gotten worse.  She feels this happened after she went through menopause.  She goes to sleep at 11 pm.  She takes elavil to help with neuropathic pain and this helps her sleep also.  She falls asleep quickly.  She wakes up several times feeling like she can't breath.  This happens more on her back.  She gets out of bed at 630 am.  Her shoulders hurt because she has to sleep on her side.  She feels tired in the morning.  She denies sleep walking, sleep talking, bruxism, or nightmares.  There is no history of restless legs.  She denies sleep hallucinations, sleep paralysis, or cataplexy.  She does have a history of TMJ.  The Epworth score is 15 out of 24.  Physical Exam:  General - pleasant Eyes - pupils reactive ENT - no sinus tenderness, no oral exudate, no LAN, MP 4, low laying soft palate, over bite Cardiac - regular, no murmur Chest - no wheeze, rales Abd - soft, non tender Ext - no edema Skin - no rashes Neuro - normal strength Psych - normal mood  Discussion: She has snoring, sleep disruption, apnea, and daytime sleepiness.  Her symptoms have progressed since I last saw her.  She has hx of TMJ and reports neuropathic pain on her scalp.  Assessment/Plan:  Snoring with excessive daytime sleepiness. - will arrange for home sleep study to further assess   Patient  Instructions  Will arrange for home sleep study Will call to arrange for follow up after sleep study reviewed     Chesley Mires, MD [place] 07/30/2017, 9:39 AM  Flow Sheet  Sleep tests: PSG 12/26/15 >> AHI 0.7, SaO2 low 89%  Cardiac tests: Echo 10/22/14 >> normal EF, grade 2 DD  Review of Systems: Constitutional: Negative for fever and unexpected weight change.  HENT: Positive for nosebleeds. Negative for congestion, dental problem, ear pain, postnasal drip, rhinorrhea, sinus pressure, sneezing, sore throat and trouble swallowing.   Eyes: Positive for redness and itching.  Respiratory: Negative for cough, chest tightness, shortness of breath and wheezing.   Cardiovascular: Positive for palpitations. Negative for leg swelling.  Gastrointestinal: Negative for nausea and vomiting.  Genitourinary: Negative for dysuria.  Musculoskeletal: Negative for joint swelling.  Skin: Negative for rash.  Allergic/Immunologic: Positive for environmental allergies, food allergies and immunocompromised state.  Neurological: Negative for headaches.  Hematological: Does not bruise/bleed easily.  Psychiatric/Behavioral: Negative for dysphoric mood. The patient is not nervous/anxious.    Past Medical History: She  has a past medical history of Acetabular labrum tear, Arthritis, Gall bladder disease, Ganglion cyst, GERD (gastroesophageal reflux disease), Hemorrhoids, Low back pain (08/20/2016), Menopause, Osteoporosis, and Thyroid disease.  Past Surgical History: She  has  a past surgical history that includes Nasal sinus surgery (1995, 2002); Cystectomy (1999); Breast surgery (2002); Total hip arthroplasty (09/12/2011); Joint replacement; Knee arthroscopy (Left, 09/10/2013); and Hemorrhoid banding (07/2014).  Family History: Her family history includes Aneurysm in her mother; Cancer in her mother; Depression in her father; Diabetes in her mother; Drug abuse in her father; Mental illness  in her sister; Nephrolithiasis in her mother; Parkinson's disease in her brother; Parkinsonism in her father; Stroke in her mother.  Social History: She  reports that she has never smoked. She has never used smokeless tobacco. She reports that she drinks about 0.6 oz of alcohol per week. She reports that she does not use drugs.  Medications: Allergies as of 07/30/2017      Reactions   Histamine Rash   Flushing      Medication List        Accurate as of 07/30/17  9:39 AM. Always use your most recent med list.          alendronate 70 MG tablet Commonly known as:  FOSAMAX Take 1 tablet (70 mg total) by mouth every 7 (seven) days. Take with a full glass of water on an empty stomach.   amitriptyline 10 MG tablet Commonly known as:  ELAVIL Take 1 tablet (10 mg total) by mouth at bedtime.   b complex vitamins capsule Take 1 capsule by mouth daily.   Biotin 1 MG Caps Take by mouth.   cetirizine 10 MG tablet Commonly known as:  ZYRTEC Take 10 mg by mouth daily.   CVS VIT D 5000 HIGH-POTENCY PO Take 5,000 Units by mouth daily.   Diclofenac Sodium 2 % Soln Place 2 g onto the skin 2 (two) times daily.   EVENING PRIMROSE OIL PO Take by mouth.   finasteride 5 MG tablet Commonly known as:  PROSCAR Take 2.5 mg by mouth daily.   fluticasone 50 MCG/ACT nasal spray Commonly known as:  FLONASE Place 2 sprays into both nostrils daily.   ibuprofen 600 MG tablet Commonly known as:  ADVIL,MOTRIN Take 1 tablet (600 mg total) by mouth every 8 (eight) hours as needed.   ketoconazole 2 % shampoo Commonly known as:  NIZORAL   Magnesium 500 MG Caps Take 500 mg by mouth daily.   methimazole 5 MG tablet Commonly known as:  TAPAZOLE Take 2.5 mg by mouth daily.   OMEGA 3 PO Take 1 capsule by mouth 2 (two) times daily.   PEPCID PO Take 10 mg by mouth 2 (two) times daily.   Potassium 99 MG Tabs Take 99 mg by mouth daily.   RESTORA RX 60-1.25 MG Caps Generic drug:  Lactobacillus  Casei-Folic Acid Take 1 tablet by mouth daily.   Theanine 100 MG Caps Take 100 mg by mouth 2 (two) times daily.   valACYclovir 500 MG tablet Commonly known as:  VALTREX Take 1 tablet (500 mg total) by mouth daily.   vitamin C 500 MG tablet Commonly known as:  ASCORBIC ACID Take 500 mg by mouth daily.   ZEGERID PO Take by mouth.   zinc gluconate 50 MG tablet Take 50 mg by mouth daily.

## 2017-07-30 NOTE — Progress Notes (Signed)
   Subjective:    Patient ID: Patricia Conley, female    DOB: 04/23/1951, 67 y.o.   MRN: [id number]  HPI    Review of Systems  Constitutional: Negative for fever and unexpected weight change.  HENT: Positive for nosebleeds. Negative for congestion, dental problem, ear pain, postnasal drip, rhinorrhea, sinus pressure, sneezing, sore throat and trouble swallowing.   Eyes: Positive for redness and itching.  Respiratory: Negative for cough, chest tightness, shortness of breath and wheezing.   Cardiovascular: Positive for palpitations. Negative for leg swelling.  Gastrointestinal: Negative for nausea and vomiting.  Genitourinary: Negative for dysuria.  Musculoskeletal: Negative for joint swelling.  Skin: Negative for rash.  Allergic/Immunologic: Positive for environmental allergies, food allergies and immunocompromised state.  Neurological: Negative for headaches.  Hematological: Does not bruise/bleed easily.  Psychiatric/Behavioral: Negative for dysphoric mood. The patient is not nervous/anxious.        Objective:   Physical Exam        Assessment & Plan:

## 2017-08-24 ENCOUNTER — Telehealth: Payer: Self-pay | Admitting: Internal Medicine

## 2017-08-24 NOTE — Telephone Encounter (Signed)
The information has been faxed.

## 2017-08-24 NOTE — Telephone Encounter (Signed)
Copied from [place] [id number]. Topic: Quick Communication - See Telephone Encounter >> Aug 24, 2017  3:34 PM Vernona Rieger wrote: CRM for notification. See Telephone encounter for: 08/24/17.  [facility] called and needs lab work faxed to them at [phone]. She said she doesn't know when it was done & she tried asking with the patient and she did not know either. Please advise.  Call back at [phone] Olin Hauser ) option 4   She needs the last CBC & CMP

## 2017-08-29 ENCOUNTER — Other Ambulatory Visit: Payer: Self-pay | Admitting: Internal Medicine

## 2017-08-29 NOTE — Telephone Encounter (Signed)
amitriptyline refill Last OV: 05/15/17 Last Refill:05/15/17 #30 3 RF Pharmacy:[facility] PCP: Dr Billey Gosling

## 2017-08-29 NOTE — Telephone Encounter (Signed)
Copied from [place] [id number]. Topic: Quick Communication - Rx Refill/Question >> Aug 29, 2017  8:33 AM Bea Graff, NT wrote: Medication: amitriptyline (ELAVIL) 10 MG tablet  Has the patient contacted their pharmacy? Yes.   (Agent: If no, request that the patient contact the pharmacy for the refill.) Preferred Pharmacy (with phone number or street name): [place], [place]. [phone] (Phone) [phone] (Fax)     Agent: Please be advised that RX refills may take up to 3 business days. We ask that you follow-up with your pharmacy.

## 2017-08-30 MED ORDER — AMITRIPTYLINE HCL 10 MG PO TABS: 10.0000 mg | ORAL_TABLET | Freq: Every day | ORAL | 0 refills | Status: DC

## 2017-09-13 ENCOUNTER — Encounter: Payer: Self-pay | Admitting: Internal Medicine

## 2017-09-17 NOTE — Progress Notes (Signed)
Subjective:    Patient ID: Patricia Conley, female    DOB: Feb 08, 1951, 67 y.o.   MRN: [id number]  HPI The patient is here for follow up.  Osteoporosis:  She started fosamax in 2017.  She is exercising regularly.  She is taking calcium and vitamin d daily.    Burning sensation of scalp:  We started amitriptyline 4 months ago and it did help.  In the past copule of weeks it has gotten wrose.  She feels the scalp is irritated from an allergy of some sort, but she can not figure out to what.  She has seen allergy, derm and immunology.  She continues to have hair loss.  She would like to avoid increasing the amitriptyline if possible.    She did go back to [facility].  She conitnues to lose her hair.  They wonder about a mold allergy.  She has a mold problem under her house.  She thinks she is allergic to it.  She may do a test through integrative health that would help to determine if she has mold in her system.    Sinus headache:  She has had a sinus headache for the past copule of weeks.  She wonders if this is related to mold in her house.  She has thick, greenish nasal mucus.  She has had two sinus surgeries in the past due to chronic sinus infections.    Medications and allergies reviewed with patient and updated if appropriate.  Patient Active Problem List   Diagnosis Date Noted  . Burning sensation of skin 05/15/2017  . 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 Anxiety 12/22/2016  . Low back pain 08/20/2016  . Ganglion cyst of finger of right hand 08/08/2016  . Peroneal tendinitis, right leg 06/12/2016  . Loss of transverse plantar arch 06/12/2016  . Mass of joint of finger 04/16/2016  . Right ankle pain 04/12/2016  . Pap smear abnormality of cervix with ASCUS favoring benign 02/08/2016  . Snoring 12/28/2015  . Rash and nonspecific skin eruption 11/06/2015  . Palpitations 08/05/2015  . 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 Abdominal pain, left upper quadrant  05/19/2015  . Osteoporosis 03/25/2015  . Dry eye 03/25/2015  . Elevated urinary free cortisol level 10/29/2014  . Hair loss 09/18/2014  . 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 Acute medial meniscal tear 09/10/2013  . Bunion of great toe 11/15/2012  . Arthralgia of ankle or foot 11/15/2012  . DJD (degenerative joint disease) 11/11/2012  . History of gout 11/11/2012  . GERD (gastroesophageal reflux disease) 10/27/2012  . S/P left THA, AA 09/12/2011  . Hemorrhoids, external without complications 02/18/2535  . Renal cyst 12/15/2010  . Adrenal nodule ([place]) 12/15/2010  . Liver hemangioma 12/15/2010  . Benign breast cyst in female   . Gall bladder disease     Current Outpatient Medications on File Prior to Visit  Medication Sig Dispense Refill  . alendronate (FOSAMAX) 70 MG tablet Take 1 tablet (70 mg total) by mouth every 7 (seven) days. Take with a full glass of water on an empty stomach. 4 tablet 11  . amitriptyline (ELAVIL) 10 MG tablet Take 1 tablet (10 mg total) by mouth at bedtime. -- Office visit needed for further refills 30 tablet 0  . b complex vitamins capsule Take 1 capsule by mouth daily.    . Biotin 1 MG CAPS Take by mouth.    . cetirizine (ZYRTEC) 10 MG tablet Take 10 mg by mouth daily.    Marland Kitchen  Cholecalciferol (CVS VIT D 5000 HIGH-POTENCY PO) Take 5,000 Units by mouth daily.    . Diclofenac Sodium 2 % SOLN Place 2 g onto the skin 2 (two) times daily. 112 g 3  . EVENING PRIMROSE OIL PO Take by mouth.    . Famotidine (PEPCID PO) Take 10 mg by mouth 2 (two) times daily.    . finasteride (PROSCAR) 5 MG tablet Take 2.5 mg by mouth daily.    . fluticasone (FLONASE) 50 MCG/ACT nasal spray Place 2 sprays into both nostrils daily. 16 g 0  . ibuprofen (ADVIL,MOTRIN) 600 MG tablet Take 1 tablet (600 mg total) by mouth every 8 (eight) hours as needed. 60 tablet 1  . ketoconazole (NIZORAL) 2 % shampoo   99  . Magnesium 500 MG CAPS Take 500 mg by mouth daily.    . methimazole  (TAPAZOLE) 5 MG tablet Take 2.5 mg by mouth daily.  11  . methotrexate (RHEUMATREX) 2.5 MG tablet Take 2.5 mg by mouth 3 (three) times a week.    . Omega-3 Fatty Acids (OMEGA 3 PO) Take 1 capsule by mouth 2 (two) times daily.    Earney Navy Bicarbonate (ZEGERID PO) Take by mouth.    . Potassium 99 MG TABS Take 99 mg by mouth daily.    Mathis Bud RX 60-1.25 MG CAPS Take 1 tablet by mouth daily. 30 capsule 11  . Theanine 100 MG CAPS Take 100 mg by mouth 2 (two) times daily.    . valACYclovir (VALTREX) 500 MG tablet Take 1 tablet (500 mg total) by mouth daily. 90 tablet 1  . vitamin C (ASCORBIC ACID) 500 MG tablet Take 500 mg by mouth daily.    Marland Kitchen zinc gluconate 50 MG tablet Take 50 mg by mouth daily.     No current facility-administered medications on file prior to visit.     Past Medical History:  Diagnosis Date  . Acetabular labrum tear    right/ per pt history  . Arthritis   . Gall bladder disease    per pt report  . Ganglion cyst   . GERD (gastroesophageal reflux disease)   . Hemorrhoids   . Low back pain 08/20/2016  . Menopause   . Osteoporosis   . Thyroid disease     Past Surgical History:  Procedure Laterality Date  . BREAST SURGERY  2002   cyst on Left breast  . CYSTECTOMY  1999   Right elbow  . HEMORRHOID BANDING  07/2014  . JOINT REPLACEMENT    . KNEE ARTHROSCOPY Left 09/10/2013   Procedure: LEFT MEDIAL AND LATERAL KNEE ARTHROSCOPY WITH MENISCAL DEBRIDEMENT ;  Surgeon: Gearlean Alf, MD;  Location: [facility];  Service: Orthopedics;  Laterality: Left;  . NASAL SINUS SURGERY  1995, 2002  . TOTAL HIP ARTHROPLASTY  09/12/2011   Procedure: TOTAL HIP ARTHROPLASTY ANTERIOR APPROACH;  Surgeon: Mauri Pole, MD;  Location: [facility];  Service: Orthopedics;  Laterality: Left;    Social History   Socioeconomic History  . Marital status: Divorced    Spouse name: Not on file  . Number of children: Not on file  . Years of education: Not on file  . Highest education  level: Not on file  Occupational History  . Occupation: Optometrist  Social Needs  . Financial resource strain: Not on file  . Food insecurity:    Worry: Not on file    Inability: Not on file  . Transportation needs:    Medical: Not on file  Non-medical: Not on file  Tobacco Use  . Smoking status: Never Smoker  . Smokeless tobacco: Never Used  Substance and Sexual Activity  . Alcohol use: Yes    Alcohol/week: 0.6 oz    Types: 1 Standard drinks or equivalent per week    Comment: once weekly or 2-3 drinks per month  . Drug use: No  . Sexual activity: Not Currently  Lifestyle  . Physical activity:    Days per week: Not on file    Minutes per session: Not on file  . Stress: Not on file  Relationships  . Social connections:    Talks on phone: Not on file    Gets together: Not on file    Attends religious service: Not on file    Active member of club or organization: Not on file    Attends meetings of clubs or organizations: Not on file    Relationship status: Not on file  Other Topics Concern  . Not on file  Social History Narrative  . Not on file    Family History  Problem Relation Age of Onset  . Stroke Mother   . Nephrolithiasis Mother   . Diabetes Mother   . Aneurysm Mother        x2  . Cancer Mother        lymphoma, breast cancer  . Depression Father   . Parkinsonism Father   . Drug abuse Father   . Parkinson's disease Brother   . Mental illness Sister        paranoia    Review of Systems  Constitutional: Negative for chills and fever.  HENT: Positive for congestion, postnasal drip, sinus pressure and sinus pain. Negative for ear pain and sore throat.   Respiratory: Positive for cough (PND). Negative for shortness of breath and wheezing.   Cardiovascular: Negative for chest pain, palpitations and leg swelling.  Gastrointestinal:       No gerd  Neurological: Positive for headaches.       Objective:   Vitals:   09/18/17 0939  BP: 110/76  Pulse:  86  Resp: 16  Temp: 98.1 F (36.7 C)  SpO2: 98%   BP Readings from Last 3 Encounters:  09/18/17 110/76  07/30/17 114/64  05/15/17 130/76   Wt Readings from Last 3 Encounters:  09/18/17 136 lb (61.7 kg)  07/30/17 136 lb 3.2 oz (61.8 kg)  05/15/17 140 lb (63.5 kg)   Body mass index is 21.3 kg/m.   Physical Exam    GENERAL APPEARANCE: Appears stated age, well appearing, NAD EYES: conjunctiva clear, no icterus HEENT: bilateral tympanic membranes and ear canals normal, oropharynx with no erythema, no thyromegaly, trachea midline, no cervical or supraclavicular lymphadenopathy LUNGS: Clear to auscultation without wheeze or crackles, unlabored breathing, good air entry bilaterally CARDIOVASCULAR: Normal S1,S2 without murmurs, no edema SKIN: Warm, dry       Assessment & Plan:    See Problem List for Assessment and Plan of chronic medical problems.

## 2017-09-18 ENCOUNTER — Encounter: Payer: Self-pay | Admitting: Internal Medicine

## 2017-09-18 ENCOUNTER — Ambulatory Visit: Payer: BLUE CROSS/BLUE SHIELD | Admitting: Internal Medicine

## 2017-09-18 VITALS — BP 110/76 | HR 86 | Temp 98.1°F | Resp 16 | Wt 136.0 lb

## 2017-09-18 DIAGNOSIS — R208 Other disturbances of skin sensation: Secondary | ICD-10-CM

## 2017-09-18 DIAGNOSIS — Z23 Encounter for immunization: Secondary | ICD-10-CM

## 2017-09-18 DIAGNOSIS — M810 Age-related osteoporosis without current pathological fracture: Secondary | ICD-10-CM

## 2017-09-18 DIAGNOSIS — J019 Acute sinusitis, unspecified: Secondary | ICD-10-CM

## 2017-09-18 MED ORDER — AMITRIPTYLINE HCL 10 MG PO TABS: 10.0000 mg | ORAL_TABLET | Freq: Every day | ORAL | 1 refills | Status: DC

## 2017-09-18 MED ORDER — AMOXICILLIN-POT CLAVULANATE 875-125 MG PO TABS: 1.0000 | ORAL_TABLET | Freq: Two times a day (BID) | ORAL | 0 refills | Status: DC

## 2017-09-18 NOTE — Assessment & Plan Note (Signed)
Continue regular exercise Continue calcium and vitamin D daily Continue Fosamax-started in 2017-we will continue for at least 5 years

## 2017-09-18 NOTE — Patient Instructions (Addendum)
  Tetanus immunization administered today.   Medications reviewed and updated.  Changes include an antibiotic for your sinus infection.   Your prescription(s) have been submitted to your pharmacy. Please take as directed and contact our office if you believe you are having problem(s) with the medication(s).    Please followup in 6 months

## 2017-09-18 NOTE — Assessment & Plan Note (Signed)
Improved with amitriptyline-continue She deferred increasing the dose at this time, but will let me know if she does want to increase it further Discussed this in some of her other symptoms could be related to systemic allergy-has seen an allergist here, but will consider seeing a [facility] allergist

## 2017-09-18 NOTE — Assessment & Plan Note (Signed)
Symptoms consistent with bacterial sinus infection, especially given her sinus surgery history Start Augmentin twice daily x10 days Over-the-counter cold medications/allergy medications as needed Rest, fluids Call if no improvement-May need to see ENT if this is more chronic issue

## 2017-09-19 DIAGNOSIS — G4733 Obstructive sleep apnea (adult) (pediatric): Secondary | ICD-10-CM | POA: Diagnosis not present

## 2017-09-24 ENCOUNTER — Ambulatory Visit: Payer: BLUE CROSS/BLUE SHIELD | Admitting: Internal Medicine

## 2017-09-25 ENCOUNTER — Telehealth: Payer: Self-pay | Admitting: Pulmonary Disease

## 2017-09-25 DIAGNOSIS — G4733 Obstructive sleep apnea (adult) (pediatric): Secondary | ICD-10-CM | POA: Diagnosis not present

## 2017-09-25 NOTE — Telephone Encounter (Signed)
HST 09/19/17 >> AHI 13.4, SaO2 low 86%   Will have my nurse inform pt that sleep study shows mild sleep apnea.  Options are 1) CPAP now, 2) ROV first.  If She is agreeable to CPAP, then please send order for auto CPAP range 5 to 15 cm H2O with heated humidity and mask of choice.  Have download sent 1 month after starting CPAP and set up ROV 2 months after starting CPAP.  ROV can be with me or NP.

## 2017-09-26 NOTE — Telephone Encounter (Signed)
ATC- unable to leave vm, as mailbox is full

## 2017-09-27 NOTE — Telephone Encounter (Signed)
Called and spoke with patient regarding results.  Informed the patient of results and recommendations today. Pt advised that since the sleep study showed mild sleep apnea; she requests ROV with VS. Scheduled appt with VS for 11-14-17 at 9:30am today. Pt verbalized understanding and denied any questions or concerns at this time.  Nothing further needed.

## 2017-10-02 ENCOUNTER — Other Ambulatory Visit: Payer: Self-pay | Admitting: *Deleted

## 2017-10-02 DIAGNOSIS — R0683 Snoring: Secondary | ICD-10-CM

## 2017-10-02 DIAGNOSIS — G4719 Other hypersomnia: Secondary | ICD-10-CM

## 2017-10-03 ENCOUNTER — Other Ambulatory Visit: Payer: Self-pay | Admitting: Internal Medicine

## 2017-10-08 ENCOUNTER — Encounter: Payer: Self-pay | Admitting: Internal Medicine

## 2017-10-09 MED ORDER — ALENDRONATE SODIUM 70 MG PO TABS: 70.0000 mg | ORAL_TABLET | ORAL | 3 refills | Status: DC

## 2017-10-09 NOTE — Telephone Encounter (Signed)
Dexa was done at [facility]. Pt will have to contact them for the report. Please advise on RX.

## 2017-11-14 ENCOUNTER — Encounter: Payer: Self-pay | Admitting: Pulmonary Disease

## 2017-11-14 ENCOUNTER — Ambulatory Visit: Payer: BLUE CROSS/BLUE SHIELD | Admitting: Pulmonary Disease

## 2017-11-14 VITALS — BP 128/82 | HR 78 | Ht 62.0 in | Wt 134.2 lb

## 2017-11-14 DIAGNOSIS — G4733 Obstructive sleep apnea (adult) (pediatric): Secondary | ICD-10-CM

## 2017-11-14 NOTE — Patient Instructions (Addendum)
Call if you need help getting an oral appliance set up to treat obstructive sleep apnea  Follow up in 6 months

## 2017-11-14 NOTE — Progress Notes (Signed)
[facility]  Chief Complaint  Patient presents with  . Follow-up    discuss sleep study.    Constitutional: BP 128/82 (BP Location: Left Arm, Cuff Size: Normal)   Pulse 78   Ht 5\' 2"  (1.575 m)   Wt 134 lb 3.2 oz (60.9 kg)   SpO2 98%   BMI 24.55 kg/m   History of Present Illness: Patricia Conley is a 67 y.o. female with obstructive sleep apnea.  She has hx of TMJ.  She had home sleep study in May.  Mild obstructive sleep apnea.  She was found to have mold allergy causing skin rash and irritation.  She is in the process of moving to new home.  She isn't sure she could handle trying CPAP at this time with so many other things going on.  She is interested in trying an oral appliance, but concerned about how this might impact her TMJ.  Her dentist is Dr. Mariea Clonts.   Comprehensive Respiratory Exam:  Appearance - well kempt  ENMT - nasal mucosa moist, turbinates clear, midline nasal septum, no dental lesions, no gingival bleeding, no oral exudates, no tonsillar hypertrophy, MP 4, low laying soft palate, overbite Neck - no masses, trachea midline, no thyromegaly, no elevation in JVP Respiratory - normal appearance of chest wall, normal respiratory effort w/o accessory muscle use, no wheezing or rales CV - s1s2 regular rate and rhythm, no murmurs, no peripheral edema GI - soft, non tender Lymph - no adenopathy noted in neck and axillary areas MSK - normal muscle strength and tone, normal gait Ext - no cyanosis, clubbing, or joint inflammation noted Skin - no rashes, lesions, or ulcers Neuro - oriented to person, place, and time Psych - normal mood and affect   Assessment/Plan:  Obstructive sleep apnea. - had extensive discussion about treatment options - she would like to pursue option of oral appliance first - she will check with her dentist >> advised her to call if she needs referral to specialist for oral appliance    Patient  Instructions  Call if you need help getting an oral appliance set up to treat obstructive sleep apnea  Follow up in 6 months    Chesley Mires, MD [place] 11/14/2017, 9:50 AM  Flow Sheet  Sleep tests: PSG 12/26/15 >> AHI 0.7, SaO2 low 89% HST 09/19/17 >> AHI 13.4, SaO2 low 86%  Cardiac tests: Echo 10/22/14 >> normal EF, grade 2 DD  Past Medical History: She  has a past medical history of Acetabular labrum tear, Arthritis, Gall bladder disease, Ganglion cyst, GERD (gastroesophageal reflux disease), Hemorrhoids, Low back pain (08/20/2016), Menopause, Osteoporosis, Telogen effluvium, and Thyroid disease.  Past Surgical History: She  has a past surgical history that includes Nasal sinus surgery (1995, 2002); Cystectomy (1999); Breast surgery (2002); Total hip arthroplasty (09/12/2011); Joint replacement; Knee arthroscopy (Left, 09/10/2013); and Hemorrhoid banding (07/2014).  Family History: Her family history includes Aneurysm in her mother; Cancer in her mother; Depression in her father; Diabetes in her mother; Drug abuse in her father; Mental illness in her sister; Nephrolithiasis in her mother; Parkinson's disease in her brother; Parkinsonism in her father; Stroke in her mother.  Social History: She  reports that she has never smoked. She has never used smokeless tobacco. She reports that she drinks about 0.6 oz of alcohol per week. She reports that she does not use drugs.  Medications: Allergies as of 11/14/2017      Reactions  Histamine Rash   Flushing      Medication List        Accurate as of 11/14/17  9:50 AM. Always use your most recent med list.          alendronate 70 MG tablet Commonly known as:  FOSAMAX Take 1 tablet (70 mg total) by mouth every 7 (seven) days. Take with a full glass of water on an empty stomach.   amitriptyline 10 MG tablet Commonly known as:  ELAVIL Take 1 tablet (10 mg total) by mouth at bedtime. -- Office visit needed for  further refills   b complex vitamins capsule Take 1 capsule by mouth daily.   Biotin 1 MG Caps Take by mouth.   cetirizine 10 MG tablet Commonly known as:  ZYRTEC Take 10 mg by mouth daily.   CVS VIT D 5000 HIGH-POTENCY PO Take 5,000 Units by mouth daily.   Diclofenac Sodium 2 % Soln Place 2 g onto the skin 2 (two) times daily.   EVENING PRIMROSE OIL PO Take by mouth.   finasteride 5 MG tablet Commonly known as:  PROSCAR Take 2.5 mg by mouth daily.   fluticasone 50 MCG/ACT nasal spray Commonly known as:  FLONASE Place 2 sprays into both nostrils daily.   ibuprofen 600 MG tablet Commonly known as:  ADVIL,MOTRIN Take 1 tablet (600 mg total) by mouth every 8 (eight) hours as needed.   ketoconazole 2 % shampoo Commonly known as:  NIZORAL   Magnesium 500 MG Caps Take 500 mg by mouth daily.   methimazole 5 MG tablet Commonly known as:  TAPAZOLE Take 2.5 mg by mouth daily.   OMEGA 3 PO Take 1 capsule by mouth 2 (two) times daily.   PEPCID PO Take 10 mg by mouth 2 (two) times daily.   Potassium 99 MG Tabs Take 99 mg by mouth daily.   RESTORA RX 60-1.25 MG Caps Generic drug:  Lactobacillus Casei-Folic Acid TAKE (1) CAPSULE DAILY.   Theanine 100 MG Caps Take 100 mg by mouth 2 (two) times daily.   valACYclovir 500 MG tablet Commonly known as:  VALTREX Take 1 tablet (500 mg total) by mouth daily.   vitamin C 500 MG tablet Commonly known as:  ASCORBIC ACID Take 500 mg by mouth daily.   ZEGERID PO Take by mouth.   zinc gluconate 50 MG tablet Take 50 mg by mouth daily.

## 2018-01-22 LAB — HM MAMMOGRAPHY

## 2018-01-30 ENCOUNTER — Encounter: Payer: Self-pay | Admitting: Internal Medicine

## 2018-02-08 NOTE — Patient Instructions (Addendum)
Tests ordered today. Your results will be released to [place] (or called to you) after review, usually within 72hours after test completion. If any changes need to be made, you will be notified at that same time.  All other Health Maintenance issues reviewed.   All recommended immunizations and age-appropriate screenings are up-to-date or discussed.  Flu immunization administered today.    Medications reviewed and updated.  Changes include :   Try increasing amitriptyline to 20 mg nightly.     Please followup in one year   Health Maintenance, Female Adopting a healthy lifestyle and getting preventive care can go a long way to promote health and wellness. Talk with your health care provider about what schedule of regular examinations is right for you. This is a good chance for you to check in with your provider about disease prevention and staying healthy. In between checkups, there are plenty of things you can do on your own. Experts have done a lot of research about which lifestyle changes and preventive measures are most likely to keep you healthy. Ask your health care provider for more information. Weight and diet Eat a healthy diet  Be sure to include plenty of vegetables, fruits, low-fat dairy products, and lean protein.  Do not eat a lot of foods high in solid fats, added sugars, or salt.  Get regular exercise. This is one of the most important things you can do for your health. ? Most adults should exercise for at least 150 minutes each week. The exercise should increase your heart rate and make you sweat (moderate-intensity exercise). ? Most adults should also do strengthening exercises at least twice a week. This is in addition to the moderate-intensity exercise.  Maintain a healthy weight  Body mass index (BMI) is a measurement that can be used to identify possible weight problems. It estimates body fat based on height and weight. Your health care provider can help determine  your BMI and help you achieve or maintain a healthy weight.  For females 4 years of age and older: ? A BMI below 18.5 is considered underweight. ? A BMI of 18.5 to 24.9 is normal. ? A BMI of 25 to 29.9 is considered overweight. ? A BMI of 30 and above is considered obese.  Watch levels of cholesterol and blood lipids  You should start having your blood tested for lipids and cholesterol at 65 years of age, then have this test every 5 years.  You may need to have your cholesterol levels checked more often if: ? Your lipid or cholesterol levels are high. ? You are older than 65 years of age. ? You are at high risk for heart disease.  Cancer screening Lung Cancer  Lung cancer screening is recommended for adults 30-61 years old who are at high risk for lung cancer because of a history of smoking.  A yearly low-dose CT scan of the lungs is recommended for people who: ? Currently smoke. ? Have quit within the past 15 years. ? Have at least a 30-pack-year history of smoking. A pack year is smoking an average of one pack of cigarettes a day for 1 year.  Yearly screening should continue until it has been 15 years since you quit.  Yearly screening should stop if you develop a health problem that would prevent you from having lung cancer treatment.  Breast Cancer  Practice breast self-awareness. This means understanding how your breasts normally appear and feel.  It also means doing regular breast self-exams.  Let your health care provider know about any changes, no matter how small.  If you are in your 20s or 30s, you should have a clinical breast exam (CBE) by a health care provider every 1-3 years as part of a regular health exam.  If you are 1 or older, have a CBE every year. Also consider having a breast X-ray (mammogram) every year.  If you have a family history of breast cancer, talk to your health care provider about genetic screening.  If you are at high risk for breast  cancer, talk to your health care provider about having an MRI and a mammogram every year.  Breast cancer gene (BRCA) assessment is recommended for women who have family members with BRCA-related cancers. BRCA-related cancers include: ? Breast. ? Ovarian. ? Tubal. ? Peritoneal cancers.  Results of the assessment will determine the need for genetic counseling and BRCA1 and BRCA2 testing.  Cervical Cancer Your health care provider may recommend that you be screened regularly for cancer of the pelvic organs (ovaries, uterus, and vagina). This screening involves a pelvic examination, including checking for microscopic changes to the surface of your cervix (Pap test). You may be encouraged to have this screening done every 3 years, beginning at age 91.  For women ages 24-65, health care providers may recommend pelvic exams and Pap testing every 3 years, or they may recommend the Pap and pelvic exam, combined with testing for human papilloma virus (HPV), every 5 years. Some types of HPV increase your risk of cervical cancer. Testing for HPV may also be done on women of any age with unclear Pap test results.  Other health care providers may not recommend any screening for nonpregnant women who are considered low risk for pelvic cancer and who do not have symptoms. Ask your health care provider if a screening pelvic exam is right for you.  If you have had past treatment for cervical cancer or a condition that could lead to cancer, you need Pap tests and screening for cancer for at least 20 years after your treatment. If Pap tests have been discontinued, your risk factors (such as having a new sexual partner) need to be reassessed to determine if screening should resume. Some women have medical problems that increase the chance of getting cervical cancer. In these cases, your health care provider may recommend more frequent screening and Pap tests.  Colorectal Cancer  This type of cancer can be detected  and often prevented.  Routine colorectal cancer screening usually begins at 65 years of age and continues through 65 years of age.  Your health care provider may recommend screening at an earlier age if you have risk factors for colon cancer.  Your health care provider may also recommend using home test kits to check for hidden blood in the stool.  A small camera at the end of a tube can be used to examine your colon directly (sigmoidoscopy or colonoscopy). This is done to check for the earliest forms of colorectal cancer.  Routine screening usually begins at age 48.  Direct examination of the colon should be repeated every 5-10 years through 65 years of age. However, you may need to be screened more often if early forms of precancerous polyps or small growths are found.  Skin Cancer  Check your skin from head to toe regularly.  Tell your health care provider about any new moles or changes in moles, especially if there is a change in a mole's shape or color.  Also tell your health care provider if you have a mole that is larger than the size of a pencil eraser.  Always use sunscreen. Apply sunscreen liberally and repeatedly throughout the day.  Protect yourself by wearing long sleeves, pants, a wide-brimmed hat, and sunglasses whenever you are outside.  Heart disease, diabetes, and high blood pressure  High blood pressure causes heart disease and increases the risk of stroke. High blood pressure is more likely to develop in: ? People who have blood pressure in the high end of the normal range (130-139/85-89 mm Hg). ? People who are overweight or obese. ? People who are African American.  If you are 34-49 years of age, have your blood pressure checked every 3-5 years. If you are 77 years of age or older, have your blood pressure checked every year. You should have your blood pressure measured twice-once when you are at a hospital or clinic, and once when you are not at a hospital or  clinic. Record the average of the two measurements. To check your blood pressure when you are not at a hospital or clinic, you can use: ? An automated blood pressure machine at a pharmacy. ? A home blood pressure monitor.  If you are between 60 years and 72 years old, ask your health care provider if you should take aspirin to prevent strokes.  Have regular diabetes screenings. This involves taking a blood sample to check your fasting blood sugar level. ? If you are at a normal weight and have a low risk for diabetes, have this test once every three years after 65 years of age. ? If you are overweight and have a high risk for diabetes, consider being tested at a younger age or more often. Preventing infection Hepatitis B  If you have a higher risk for hepatitis B, you should be screened for this virus. You are considered at high risk for hepatitis B if: ? You were born in a country where hepatitis B is common. Ask your health care provider which countries are considered high risk. ? Your parents were born in a high-risk country, and you have not been immunized against hepatitis B (hepatitis B vaccine). ? You have HIV or AIDS. ? You use needles to inject street drugs. ? You live with someone who has hepatitis B. ? You have had sex with someone who has hepatitis B. ? You get hemodialysis treatment. ? You take certain medicines for conditions, including cancer, organ transplantation, and autoimmune conditions.  Hepatitis C  Blood testing is recommended for: ? Everyone born from [birth year] through 1963. ? Anyone with known risk factors for hepatitis C.  Sexually transmitted infections (STIs)  You should be screened for sexually transmitted infections (STIs) including gonorrhea and chlamydia if: ? You are sexually active and are younger than 65 years of age. ? You are older than 65 years of age and your health care provider tells you that you are at risk for this type of infection. ? Your  sexual activity has changed since you were last screened and you are at an increased risk for chlamydia or gonorrhea. Ask your health care provider if you are at risk.  If you do not have HIV, but are at risk, it may be recommended that you take a prescription medicine daily to prevent HIV infection. This is called pre-exposure prophylaxis (PrEP). You are considered at risk if: ? You are sexually active and do not regularly use condoms or know the HIV status of  your partner(s). ? You take drugs by injection. ? You are sexually active with a partner who has HIV.  Talk with your health care provider about whether you are at high risk of being infected with HIV. If you choose to begin PrEP, you should first be tested for HIV. You should then be tested every 3 months for as long as you are taking PrEP. Pregnancy  If you are premenopausal and you may become pregnant, ask your health care provider about preconception counseling.  If you may become pregnant, take 400 to 800 micrograms (mcg) of folic acid every day.  If you want to prevent pregnancy, talk to your health care provider about birth control (contraception). Osteoporosis and menopause  Osteoporosis is a disease in which the bones lose minerals and strength with aging. This can result in serious bone fractures. Your risk for osteoporosis can be identified using a bone density scan.  If you are 70 years of age or older, or if you are at risk for osteoporosis and fractures, ask your health care provider if you should be screened.  Ask your health care provider whether you should take a calcium or vitamin D supplement to lower your risk for osteoporosis.  Menopause may have certain physical symptoms and risks.  Hormone replacement therapy may reduce some of these symptoms and risks. Talk to your health care provider about whether hormone replacement therapy is right for you. Follow these instructions at home:  Schedule regular health,  dental, and eye exams.  Stay current with your immunizations.  Do not use any tobacco products including cigarettes, chewing tobacco, or electronic cigarettes.  If you are pregnant, do not drink alcohol.  If you are breastfeeding, limit how much and how often you drink alcohol.  Limit alcohol intake to no more than 1 drink per day for nonpregnant women. One drink equals 12 ounces of beer, 5 ounces of wine, or 1 ounces of hard liquor.  Do not use street drugs.  Do not share needles.  Ask your health care provider for help if you need support or information about quitting drugs.  Tell your health care provider if you often feel depressed.  Tell your health care provider if you have ever been abused or do not feel safe at home. This information is not intended to replace advice given to you by your health care provider. Make sure you discuss any questions you have with your health care provider. Document Released: 10/24/2010 Document Revised: 09/16/2015 Document Reviewed: 01/12/2015 Elsevier Interactive Patient Education  [employer].

## 2018-02-08 NOTE — Progress Notes (Signed)
Subjective:    Patient ID: Patricia Conley, female    DOB: April 04, 1951, 67 y.o.   MRN: [id number]  HPI She is here for a physical exam.   She has had hair loss for a couple of years.  In June she was waking up with headaches daily.  She discovered one of her air ducts was not connected and she was breathing in the air from under her house.  Once she removed herself from her house she felt much better.  Her scalp and inflammation overall has greatly improved.    Her scalp still itches and is inflamed - causes dicomfort.  She is taking amitriptyline at night and it does help.  She wonders about trying doxepin instead - if it would work better.    Medications and allergies reviewed with patient and updated if appropriate.  Patient Active Problem List   Diagnosis Date Noted  . Burning sensation of skin, scalp 05/15/2017  . 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 Anxiety 12/22/2016  . Xerostomia 10/18/2016  . Allergic rhinitis caused by mold 10/18/2016  . Low back pain 08/20/2016  . Ganglion cyst of finger of right hand 08/08/2016  . Peroneal tendinitis, right leg 06/12/2016  . Loss of transverse plantar arch 06/12/2016  . Mass of joint of finger 04/16/2016  . Right ankle pain 04/12/2016  . Pap smear abnormality of cervix with ASCUS favoring benign 02/08/2016  . OSA (obstructive sleep apnea) 12/28/2015  . Snoring 12/28/2015  . Palpitations 08/05/2015  . 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 Abdominal pain, left upper quadrant 05/19/2015  . Osteoporosis 03/25/2015  . Dry eye 03/25/2015  . Hyperthyroidism 01/06/2015  . Hair loss 09/18/2014  . 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 Acute medial meniscal tear 09/10/2013  . Bunion of great toe 11/15/2012  . DJD (degenerative joint disease) 11/11/2012  . History of gout 11/11/2012  . GERD (gastroesophageal reflux disease) 10/27/2012  . S/P left THA, AA 09/12/2011  . Hemorrhoids, external without complications 07/62/2633  .  Osteoarthritis of hip 03/14/2011  . Renal cyst 12/15/2010  . Adrenal nodule ([place]) 12/15/2010  . Liver hemangioma 12/15/2010  . Benign breast cyst in female   . Gall bladder disease     Current Outpatient Medications on File Prior to Visit  Medication Sig Dispense Refill  . alendronate (FOSAMAX) 70 MG tablet Take 1 tablet (70 mg total) by mouth every 7 (seven) days. Take with a full glass of water on an empty stomach. 12 tablet 3  . b complex vitamins capsule Take 1 capsule by mouth daily.    . Biotin 1 MG CAPS Take by mouth.    . cetirizine (ZYRTEC) 10 MG tablet Take 10 mg by mouth daily.    . Cholecalciferol ([employer] VIT D 5000 HIGH-POTENCY PO) Take 5,000 Units by mouth daily.    . cholestyramine (QUESTRAN) 4 GM/DOSE powder once daily    . Clobetasol Propionate 0.05 % shampoo Apply topically.    . Diclofenac Sodium 2 % SOLN Place 2 g onto the skin 2 (two) times daily. 112 g 3  . EVENING PRIMROSE OIL PO Take by mouth.    . Famotidine (PEPCID PO) Take 10 mg by mouth 2 (two) times daily.    . fluocinonide (LIDEX) 0.05 % external solution   6  . folic acid (FOLVITE) 1 MG tablet Take by mouth.    Marland Kitchen ketoconazole (NIZORAL) 2 % shampoo   99  . Lactobacillus Casei-Folic Acid 35-4.56 MG CAPS TAKE (  1) CAPSULE DAILY.    . Magnesium 500 MG CAPS Take 500 mg by mouth daily.    . methimazole (TAPAZOLE) 5 MG tablet Take 2.5 mg by mouth daily.  11  . montelukast (SINGULAIR) 10 MG tablet   5  . Omega-3 Fatty Acids (OMEGA 3 PO) Take 1 capsule by mouth 2 (two) times daily.    . Potassium 99 MG TABS Take 99 mg by mouth daily.    . Theanine 100 MG CAPS Take 100 mg by mouth 2 (two) times daily.    . valACYclovir (VALTREX) 500 MG tablet Take 1 tablet (500 mg total) by mouth daily. 90 tablet 1  . vitamin C (ASCORBIC ACID) 500 MG tablet Take 500 mg by mouth daily.    Marland Kitchen zinc gluconate 50 MG tablet Take 50 mg by mouth daily.     No current facility-administered medications on file prior to visit.     Past  Medical History:  Diagnosis Date  . Acetabular labrum tear    right/ per pt history  . Arthritis   . Gall bladder disease    per pt report  . Ganglion cyst   . GERD (gastroesophageal reflux disease)   . Hemorrhoids   . Low back pain 08/20/2016  . Menopause   . Osteoporosis   . Telogen effluvium   . Thyroid disease     Past Surgical History:  Procedure Laterality Date  . BREAST SURGERY  2002   cyst on Left breast  . CYSTECTOMY  1999   Right elbow  . HEMORRHOID BANDING  07/2014  . JOINT REPLACEMENT    . KNEE ARTHROSCOPY Left 09/10/2013   Procedure: LEFT MEDIAL AND LATERAL KNEE ARTHROSCOPY WITH MENISCAL DEBRIDEMENT ;  Surgeon: Gearlean Alf, MD;  Location: [facility];  Service: Orthopedics;  Laterality: Left;  . NASAL SINUS SURGERY  1995, 2002  . TOTAL HIP ARTHROPLASTY  09/12/2011   Procedure: TOTAL HIP ARTHROPLASTY ANTERIOR APPROACH;  Surgeon: Mauri Pole, MD;  Location: [facility];  Service: Orthopedics;  Laterality: Left;    Social History   Socioeconomic History  . Marital status: Divorced    Spouse name: Not on file  . Number of children: Not on file  . Years of education: Not on file  . Highest education level: Not on file  Occupational History  . Occupation: Optometrist  Social Needs  . Financial resource strain: Not on file  . Food insecurity:    Worry: Not on file    Inability: Not on file  . Transportation needs:    Medical: Not on file    Non-medical: Not on file  Tobacco Use  . Smoking status: Never Smoker  . Smokeless tobacco: Never Used  Substance and Sexual Activity  . Alcohol use: Yes    Alcohol/week: 1.0 standard drinks    Types: 1 Standard drinks or equivalent per week    Comment: once weekly or 2-3 drinks per month  . Drug use: No  . Sexual activity: Not Currently  Lifestyle  . Physical activity:    Days per week: Not on file    Minutes per session: Not on file  . Stress: Not on file  Relationships  . Social connections:    Talks on phone:  Not on file    Gets together: Not on file    Attends religious service: Not on file    Active member of club or organization: Not on file    Attends meetings of clubs or organizations:  Not on file    Relationship status: Not on file  Other Topics Concern  . Not on file  Social History Narrative  . Not on file    Family History  Problem Relation Age of Onset  . Stroke Mother   . Nephrolithiasis Mother   . Diabetes Mother   . Aneurysm Mother        x2  . Cancer Mother        lymphoma, breast cancer  . Depression Father   . Parkinsonism Father   . Drug abuse Father   . Parkinson's disease Brother   . Mental illness Sister        paranoia    Review of Systems  Constitutional: Negative for chills and fever.  Eyes: Negative for visual disturbance.  Respiratory: Negative for cough, shortness of breath and wheezing.   Cardiovascular: Positive for palpitations (occasional). Negative for chest pain and leg swelling.  Gastrointestinal: Negative for abdominal pain, blood in stool, constipation, diarrhea and nausea.       Occasional GERD  Genitourinary: Positive for frequency. Negative for dysuria and hematuria.  Musculoskeletal: Positive for arthralgias (stiffness) and neck pain.  Skin: Negative for rash.       Scalp irritation - itching, pain  Neurological: Positive for numbness (right arm when sleeping). Negative for light-headedness and headaches.  Psychiatric/Behavioral: Negative for dysphoric mood. The patient is nervous/anxious.        Objective:   Vitals:   02/11/18 0745  BP: 108/72  Pulse: 85  Resp: 16  Temp: 98.2 F (36.8 C)  SpO2: 98%   Filed Weights   02/11/18 0745  Weight: 131 lb 12.8 oz (59.8 kg)   Body mass index is 24.11 kg/m.  Wt Readings from Last 3 Encounters:  02/11/18 131 lb 12.8 oz (59.8 kg)  11/14/17 134 lb 3.2 oz (60.9 kg)  09/18/17 136 lb (61.7 kg)     Physical Exam Constitutional: She appears well-developed and well-nourished. No  distress.  HENT:  Head: Normocephalic and atraumatic.  Right Ear: External ear normal. Normal ear canal and TM Left Ear: External ear normal.  Normal ear canal and TM Mouth/Throat: Oropharynx is clear and moist.  Eyes: Conjunctivae and EOM are normal.  Neck: Neck supple. No tracheal deviation present. No thyromegaly present.  No carotid bruit  Cardiovascular: Normal rate, regular rhythm and normal heart sounds.   No murmur heard.  No edema. Pulmonary/Chest: Effort normal and breath sounds normal. No respiratory distress. She has no wheezes. She has no rales.  Breast: deferred to Gyn Abdominal: Soft. She exhibits no distension. There is no tenderness.  Lymphadenopathy: She has no cervical adenopathy.  Skin: Skin is warm and dry. She is not diaphoretic.  Psychiatric: She has a normal mood and affect. Her behavior is normal.        Assessment & Plan:   Physical exam: Screening blood work   ordered Immunizations   Flu vaccine today, discussed shingrix Colonoscopy   Up to date  Mammogram   Up to date  Gyn   Up to date  Dexa   Up to date - on fosamax Eye exams  - Dr Marty Heck   - Up to date  EKG       Done 10/2015 Exercise  Active, but no regular exercise Weight     Normal BMI Skin  - sees dermatology Substance abuse   none  See Problem List for Assessment and Plan of chronic medical problems.

## 2018-02-10 NOTE — Assessment & Plan Note (Addendum)
Following with endocrine On methimazole Will check tfts - can f/u with endo if abnormal

## 2018-02-11 ENCOUNTER — Other Ambulatory Visit (INDEPENDENT_AMBULATORY_CARE_PROVIDER_SITE_OTHER): Payer: BLUE CROSS/BLUE SHIELD

## 2018-02-11 ENCOUNTER — Ambulatory Visit (INDEPENDENT_AMBULATORY_CARE_PROVIDER_SITE_OTHER): Payer: BLUE CROSS/BLUE SHIELD | Admitting: Internal Medicine

## 2018-02-11 ENCOUNTER — Encounter: Payer: Self-pay | Admitting: Internal Medicine

## 2018-02-11 VITALS — BP 108/72 | HR 85 | Temp 98.2°F | Resp 16 | Ht 62.0 in | Wt 131.8 lb

## 2018-02-11 DIAGNOSIS — Z0000 Encounter for general adult medical examination without abnormal findings: Secondary | ICD-10-CM

## 2018-02-11 DIAGNOSIS — Z23 Encounter for immunization: Secondary | ICD-10-CM

## 2018-02-11 DIAGNOSIS — E0590 Thyrotoxicosis, unspecified without thyrotoxic crisis or storm: Secondary | ICD-10-CM

## 2018-02-11 DIAGNOSIS — M810 Age-related osteoporosis without current pathological fracture: Secondary | ICD-10-CM

## 2018-02-11 DIAGNOSIS — R208 Other disturbances of skin sensation: Secondary | ICD-10-CM

## 2018-02-11 LAB — CBC WITH DIFFERENTIAL/PLATELET
BASOS ABS: 0 10*3/uL (ref 0.0–0.1)
Basophils Relative: 1 % (ref 0.0–3.0)
EOS PCT: 1.5 % (ref 0.0–5.0)
Eosinophils Absolute: 0.1 10*3/uL (ref 0.0–0.7)
HCT: 41.2 % (ref 36.0–46.0)
HEMOGLOBIN: 13.9 g/dL (ref 12.0–15.0)
LYMPHS ABS: 1.4 10*3/uL (ref 0.7–4.0)
Lymphocytes Relative: 31.2 % (ref 12.0–46.0)
MCHC: 33.7 g/dL (ref 30.0–36.0)
MCV: 87.8 fl (ref 78.0–100.0)
Monocytes Absolute: 0.4 10*3/uL (ref 0.1–1.0)
Monocytes Relative: 8.5 % (ref 3.0–12.0)
Neutro Abs: 2.5 10*3/uL (ref 1.4–7.7)
Neutrophils Relative %: 57.8 % (ref 43.0–77.0)
Platelets: 234 10*3/uL (ref 150.0–400.0)
RBC: 4.69 Mil/uL (ref 3.87–5.11)
RDW: 12.8 % (ref 11.5–15.5)
WBC: 4.4 10*3/uL (ref 4.0–10.5)

## 2018-02-11 LAB — COMPREHENSIVE METABOLIC PANEL
ALBUMIN: 4.6 g/dL (ref 3.5–5.2)
ALK PHOS: 50 U/L (ref 39–117)
ALT: 17 U/L (ref 0–35)
AST: 23 U/L (ref 0–37)
BILIRUBIN TOTAL: 1.4 mg/dL — AB (ref 0.2–1.2)
BUN: 17 mg/dL (ref 6–23)
CO2: 27 mEq/L (ref 19–32)
Calcium: 9.5 mg/dL (ref 8.4–10.5)
Chloride: 103 mEq/L (ref 96–112)
Creatinine, Ser: 0.8 mg/dL (ref 0.40–1.20)
GFR: 76.52 mL/min (ref >60.00)
GLUCOSE: 86 mg/dL (ref 70–99)
POTASSIUM: 4.5 meq/L (ref 3.5–5.1)
Sodium: 141 mEq/L (ref 135–145)
TOTAL PROTEIN: 7 g/dL (ref 6.0–8.3)

## 2018-02-11 LAB — LIPID PANEL
CHOLESTEROL: 137 mg/dL (ref 0–200)
HDL: 67.8 mg/dL (ref >39.00)
LDL Cholesterol: 56 mg/dL (ref 0–99)
NONHDL: 69.39
TRIGLYCERIDES: 66 mg/dL (ref 0.0–149.0)
Total CHOL/HDL Ratio: 2
VLDL: 13.2 mg/dL (ref 0.0–40.0)

## 2018-02-11 LAB — T3, FREE: T3, Free: 3.2 pg/mL (ref 2.3–4.2)

## 2018-02-11 LAB — T4, FREE: Free T4: 0.79 ng/dL (ref 0.60–1.60)

## 2018-02-11 LAB — TSH: TSH: 1.61 u[IU]/mL (ref 0.35–4.50)

## 2018-02-11 MED ORDER — AMITRIPTYLINE HCL 10 MG PO TABS: 20.0000 mg | ORAL_TABLET | Freq: Every day | ORAL | 1 refills | Status: DC

## 2018-02-11 NOTE — Assessment & Plan Note (Signed)
Taking fosamax May want to consider forteo - will look into coverage Active, but not exercising - encouraged regular exercise Taking vitamin D - level was good three months ago dexa up to date

## 2018-02-11 NOTE — Assessment & Plan Note (Signed)
Inflamed scalp -with discomfort and hair loss- telogen effluvium Burning sensation in scalp as well Improved with amitriptyline - increase to 20 mg nightly If not effective - she may want to try oral doxepin - someone she knows is on this and it helps with similar symptoms

## 2018-02-12 ENCOUNTER — Encounter: Payer: Self-pay | Admitting: Internal Medicine

## 2018-02-18 ENCOUNTER — Telehealth: Payer: Self-pay | Admitting: *Deleted

## 2018-02-18 NOTE — Telephone Encounter (Signed)
Copied from [place] [id number]. Topic: Appointment Scheduling - Scheduling Inquiry for Clinic >> Feb 18, 2018  8:53 AM Reyne Dumas L wrote: Reason for CRM:   Pt states she needs to be seen today or tomorrow by Dr. Tamala Julian for big right toe swollen.  Pt states she was seen by Dr. Quay Burow and mentioned this last week and was told that Dr. Tamala Julian is who she needed to see.  Pt states she will be flying out of the state on Wednesday. Pt can be reached at [phone]

## 2018-02-18 NOTE — Telephone Encounter (Signed)
Scheduled tmrw at 8:45am.

## 2018-02-18 NOTE — Progress Notes (Signed)
[facility] [place], [place] [address] Phone: [phone] Subjective:   Patricia Conley, am serving as a scribe for Dr. Hulan Saas.  CC: Toe pain  PYK:[id number]  Patricia Conley is a 67 y.o. female coming in with complaint of right great toe pain for 2 months. She has pain when she firsts steps out of the bed. Pain over 1st MTP joint. Does use orthotics in her shoes usually but was wearing them with Naot shoes last week and she developed an increase in her pain. Tender over lateral aspect of the joint. Did try Pennsaid which helped.       Past Medical History:  Diagnosis Date  . Acetabular labrum tear    right/ per pt history  . Arthritis   . Gall bladder disease    per pt report  . Ganglion cyst   . GERD (gastroesophageal reflux disease)   . Hemorrhoids   . Low back pain 08/20/2016  . Menopause   . Osteoporosis   . Telogen effluvium   . Thyroid disease    Past Surgical History:  Procedure Laterality Date  . BREAST SURGERY  2002   cyst on Left breast  . CYSTECTOMY  1999   Right elbow  . HEMORRHOID BANDING  07/2014  . JOINT REPLACEMENT    . KNEE ARTHROSCOPY Left 09/10/2013   Procedure: LEFT MEDIAL AND LATERAL KNEE ARTHROSCOPY WITH MENISCAL DEBRIDEMENT ;  Surgeon: Gearlean Alf, MD;  Location: [facility];  Service: Orthopedics;  Laterality: Left;  . NASAL SINUS SURGERY  1995, 2002  . TOTAL HIP ARTHROPLASTY  09/12/2011   Procedure: TOTAL HIP ARTHROPLASTY ANTERIOR APPROACH;  Surgeon: Mauri Pole, MD;  Location: [facility];  Service: Orthopedics;  Laterality: Left;   Social History   Socioeconomic History  . Marital status: Divorced    Spouse name: Not on file  . Number of children: Not on file  . Years of education: Not on file  . Highest education level: Not on file  Occupational History  . Occupation: Optometrist  Social Needs  . Financial resource strain: Not on file  . Food insecurity:    Worry: Not on file   Inability: Not on file  . Transportation needs:    Medical: Not on file    Non-medical: Not on file  Tobacco Use  . Smoking status: Never Smoker  . Smokeless tobacco: Never Used  Substance and Sexual Activity  . Alcohol use: Yes    Alcohol/week: 1.0 standard drinks    Types: 1 Standard drinks or equivalent per week    Comment: once weekly or 2-3 drinks per month  . Drug use: Conley  . Sexual activity: Not Currently  Lifestyle  . Physical activity:    Days per week: Not on file    Minutes per session: Not on file  . Stress: Not on file  Relationships  . Social connections:    Talks on phone: Not on file    Gets together: Not on file    Attends religious service: Not on file    Active member of club or organization: Not on file    Attends meetings of clubs or organizations: Not on file    Relationship status: Not on file  Other Topics Concern  . Not on file  Social History Narrative  . Not on file   Allergies  Allergen Reactions  . Histamine Rash    Flushing   Family History  Problem  Relation Age of Onset  . Stroke Mother   . Nephrolithiasis Mother   . Diabetes Mother   . Aneurysm Mother        x2  . Cancer Mother        lymphoma, breast cancer  . Depression Father   . Parkinsonism Father   . Drug abuse Father   . Parkinson's disease Brother   . Mental illness Sister        paranoia    Current Outpatient Medications (Endocrine & Metabolic):  .  alendronate (FOSAMAX) 70 MG tablet, Take 1 tablet (70 mg total) by mouth every 7 (seven) days. Take with a full glass of water on an empty stomach. .  methimazole (TAPAZOLE) 5 MG tablet, Take 2.5 mg by mouth daily.  Current Outpatient Medications (Cardiovascular):  .  cholestyramine (QUESTRAN) 4 GM/DOSE powder, once daily  Current Outpatient Medications (Respiratory):  .  cetirizine (ZYRTEC) 10 MG tablet, Take 10 mg by mouth daily. .  montelukast (SINGULAIR) 10 MG tablet,    Current Outpatient Medications  (Hematological):  .  folic acid (FOLVITE) 1 MG tablet, Take by mouth.  Current Outpatient Medications (Other):  .  amitriptyline (ELAVIL) 10 MG tablet, Take 2 tablets (20 mg total) by mouth at bedtime. Marland Kitchen  b complex vitamins capsule, Take 1 capsule by mouth daily. .  Biotin 1 MG CAPS, Take by mouth. .  Cholecalciferol (CVS VIT D 5000 HIGH-POTENCY PO), Take 5,000 Units by mouth daily. .  Clobetasol Propionate 0.05 % shampoo, Apply topically. .  Diclofenac Sodium 2 % SOLN, Place 2 g onto the skin 2 (two) times daily. Marland Kitchen  EVENING PRIMROSE OIL PO, Take by mouth. .  Famotidine (PEPCID PO), Take 10 mg by mouth 2 (two) times daily. .  fluocinonide (LIDEX) 0.05 % external solution,  .  ketoconazole (NIZORAL) 2 % shampoo,  .  Lactobacillus Casei-Folic Acid 18-5.63 MG CAPS, TAKE (1) CAPSULE DAILY. .  Magnesium 500 MG CAPS, Take 500 mg by mouth daily. .  Omega-3 Fatty Acids (OMEGA 3 PO), Take 1 capsule by mouth 2 (two) times daily. .  Potassium 99 MG TABS, Take 99 mg by mouth daily. .  Theanine 100 MG CAPS, Take 100 mg by mouth 2 (two) times daily. .  valACYclovir (VALTREX) 500 MG tablet, Take 1 tablet (500 mg total) by mouth daily. .  vitamin C (ASCORBIC ACID) 500 MG tablet, Take 500 mg by mouth daily. Marland Kitchen  zinc gluconate 50 MG tablet, Take 50 mg by mouth daily.    Past medical history, social, surgical and family history all reviewed in electronic medical record.  Conley pertanent information unless stated regarding to the chief complaint.   Review of Systems:  Conley headache, visual changes, nausea, vomiting, diarrhea, constipation, dizziness, abdominal pain, skin rash, fevers, chills, night sweats, weight loss, swollen lymph nodes, body aches, joint swelling, muscle aches, chest pain, shortness of breath, mood changes.   Objective  Blood pressure 112/80, pulse 75, height 5\' 2"  (1.575 m), weight 123 lb (55.8 kg), SpO2 98 %.    General: Conley apparent distress alert and oriented x3 mood and affect normal,  dressed appropriately.  HEENT: Pupils equal, extraocular movements intact  Respiratory: Patient's speak in full sentences and does not appear Thatch of breath  Cardiovascular: Conley lower extremity edema, non tender, Conley erythema  Skin: Warm dry intact with Conley signs of infection or rash on extremities or on axial skeleton.  Abdomen: Soft nontender  Neuro: Cranial nerves II through XII  are intact, neurovascularly intact in all extremities with 2+ DTRs and 2+ pulses.  Lymph: Conley lymphadenopathy of posterior or anterior cervical chain or axillae bilaterally.  Gait normal with good balance and coordination.  MSK:  Non tender with full range of motion and good stability and symmetric strength and tone of shoulders, elbows, wrist, hip, knee and ankles bilaterally.  Left foot exam shows the patient's first toe does have some swelling.  Arthritic changes noted.  Bunion and bunionette formation with severe breakdown of the transverse arch.  Mild overpronation of the hindfoot right greater than left as well.  Limited musculoskeletal ultrasound was performed and interpreted by Patricia Conley  Limited ultrasound of patient's first toe shows the patient has severe capsulitis noted.  Joint spaces deteriorated significantly on the side compared to the contralateral side. Impression: Capsulitis with first toe arthritis  Procedure: Real-time Ultrasound Guided Injection of right first MTP Device: GE Logiq Q7 Ultrasound guided injection is preferred based studies that show increased duration, increased effect, greater accuracy, decreased procedural pain, increased response rate, and decreased cost with ultrasound guided versus blind injection.  Verbal informed consent obtained.  Time-out conducted.  Noted Conley overlying erythema, induration, or other signs of local infection.  Skin prepped in a sterile fashion.  Local anesthesia: Topical Ethyl chloride.  With sterile technique and under real time ultrasound guidance:  With a 25-gauge half inch needle patient was injected with 0.5 cc of 0.5% Marcaine and 0.5 cc of Kenalog 40 mg/mL Completed without difficulty  Pain immediately resolved suggesting accurate placement of the medication.  Advised to call if fevers/chills, erythema, induration, drainage, or persistent bleeding.  Images permanently stored and available for review in the ultrasound unit.  Impression: Technically successful ultrasound guided injection.    Impression and Recommendations:     This case required medical decision making of moderate complexity. The above documentation has been reviewed and is accurate and complete Patricia Pulley, DO       Note: This dictation was prepared with Dragon dictation along with smaller phrase technology. Any transcriptional errors that result from this process are unintentional.

## 2018-02-19 ENCOUNTER — Ambulatory Visit: Payer: BLUE CROSS/BLUE SHIELD | Admitting: Family Medicine

## 2018-02-19 ENCOUNTER — Encounter: Payer: Self-pay | Admitting: Family Medicine

## 2018-02-19 ENCOUNTER — Ambulatory Visit: Payer: Self-pay

## 2018-02-19 VITALS — BP 112/80 | HR 75 | Ht 62.0 in | Wt 123.0 lb

## 2018-02-19 DIAGNOSIS — M19079 Primary osteoarthritis, unspecified ankle and foot: Secondary | ICD-10-CM | POA: Diagnosis not present

## 2018-02-19 DIAGNOSIS — M79671 Pain in right foot: Secondary | ICD-10-CM | POA: Diagnosis not present

## 2018-02-19 NOTE — Patient Instructions (Signed)
Good to see you  Ice 20 minutes 2 times daily. Usually after activity and before bed. Do not walk barefoot.  pennsaid pinkie amount topically 2 times daily as needed.  Good shoes with rigid bottom.  Jalene Mullet, [place] or [place] balance greater then [address] at [employer] may be good for your trip  See me again in 4 weeks

## 2018-02-19 NOTE — Assessment & Plan Note (Signed)
Patient given injection.  Tolerated the procedure well.  Discussed icing regimen and home exercise.  Discussed which activities to do which wants to avoid.  Discussed proper shoes.  Follow-up again in 4 weeks

## 2018-07-30 ENCOUNTER — Telehealth: Payer: Self-pay | Admitting: Internal Medicine

## 2018-07-30 NOTE — Telephone Encounter (Signed)
Pt aware of response.  

## 2018-07-30 NOTE — Telephone Encounter (Signed)
Copied from [place] [id number]. Topic: General - Other >> Jul 30, 2018  1:35 PM Oneta Rack wrote: Relation to pt: self  Call back number:  [phone]    Reason for call:  Patient contacted [facility] regarding inflammation of right hip pain, specialist will prescribe predisone but did state medication will make patient immune to COVID-19. Specialist advised patient to follow up with PCP regarding medication prior to prescribing, please advise

## 2018-07-30 NOTE — Telephone Encounter (Signed)
Information below does not make complete sense.  I think what they are trying to tell her is that it will decrease her immunity.  If the pain is not that bad I would try to avoid steroids at this time because they do lower immunity and make her more susceptible to infections.

## 2018-07-30 NOTE — Telephone Encounter (Signed)
Please advise 

## 2018-09-16 ENCOUNTER — Encounter: Payer: Self-pay | Admitting: Internal Medicine

## 2018-10-11 ENCOUNTER — Other Ambulatory Visit: Payer: Self-pay | Admitting: Internal Medicine

## 2018-10-11 NOTE — Telephone Encounter (Signed)
Pls advise if ok to refill../lmb 

## 2018-10-17 ENCOUNTER — Other Ambulatory Visit: Payer: Self-pay | Admitting: Internal Medicine

## 2019-01-13 ENCOUNTER — Telehealth: Payer: Self-pay | Admitting: Internal Medicine

## 2019-01-13 NOTE — Telephone Encounter (Signed)
Patient is calling to request advice is she due for the  neuponia vaccine/ Please advise CB- [phone]

## 2019-01-14 NOTE — Telephone Encounter (Signed)
Spoke with pt and scheduled an appointment for a pneumonia shot.

## 2019-01-17 ENCOUNTER — Ambulatory Visit (INDEPENDENT_AMBULATORY_CARE_PROVIDER_SITE_OTHER): Payer: BLUE CROSS/BLUE SHIELD

## 2019-01-17 ENCOUNTER — Other Ambulatory Visit: Payer: Self-pay

## 2019-01-17 DIAGNOSIS — Z299 Encounter for prophylactic measures, unspecified: Secondary | ICD-10-CM

## 2019-01-17 DIAGNOSIS — Z23 Encounter for immunization: Secondary | ICD-10-CM | POA: Diagnosis not present

## 2019-02-12 NOTE — Progress Notes (Signed)
Subjective:    Patient ID: Patricia Conley, female    DOB: 18-Feb-1951, 68 y.o.   MRN: [id number]  HPI Here for an annual physical exam.   She continues to lose her hair.    She saw a allergy specialist and is on a yeast free, gluten free diet.  She is taking nystatin and four supplements for inflammation.  She had too much yeast in her system.  She is very sensitive to different environmental factors and some food and is continuing to try to figure out what is causing some of her allergy symptoms, hair falling out, etc.  Otherwise she feels very well.  Medications and allergies reviewed with patient and updated if appropriate.  Patient Active Problem List   Diagnosis Date Noted  . 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 Burning sensation of skin, scalp 05/15/2017  . 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 Anxiety 12/22/2016  . Xerostomia 10/18/2016  . Allergic rhinitis caused by mold 10/18/2016  . Low back pain 08/20/2016  . Ganglion cyst of finger of right hand 08/08/2016  . Peroneal tendinitis, right leg 06/12/2016  . Loss of transverse plantar arch 06/12/2016  . Mass of joint of finger 04/16/2016  . Pap smear abnormality of cervix with ASCUS favoring benign 02/08/2016  . OSA (obstructive sleep apnea) 12/28/2015  . Palpitations 08/05/2015  . 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 Abdominal pain, left upper quadrant 05/19/2015  . Osteoporosis 03/25/2015  . Dry eye 03/25/2015  . Hyperthyroidism 01/06/2015  . Hair loss 09/18/2014  . 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 Acute medial meniscal tear 09/10/2013  . Bunion of great toe 11/15/2012  . DJD (degenerative joint disease) 11/11/2012  . History of gout 11/11/2012  . GERD (gastroesophageal reflux disease) 10/27/2012  . S/P left THA, AA 09/12/2011  . Hemorrhoids, external without complications [id number]  . Osteoarthritis of hip 03/14/2011  . Renal cyst 12/15/2010  . Adrenal nodule ([place]) 12/15/2010  . Liver  hemangioma 12/15/2010  . Benign breast cyst in female   . Gall bladder disease     Current Outpatient Medications on File Prior to Visit  Medication Sig Dispense Refill  . b complex vitamins capsule Take 1 capsule by mouth daily.    . Biotin 1 MG CAPS Take by mouth.    . Cholecalciferol (CVS VIT D 5000 HIGH-POTENCY PO) Take 5,000 Units by mouth daily.    . cholestyramine (QUESTRAN) 4 GM/DOSE powder once daily    . Diclofenac Sodium 2 % SOLN Place 2 g onto the skin 2 (two) times daily. 112 g 3  . EVENING PRIMROSE OIL PO Take by mouth.    . Famotidine (PEPCID PO) Take 10 mg by mouth 2 (two) times daily.    . fluocinonide (LIDEX) 0.05 % external solution   6  . folic acid (FOLVITE) 1 MG tablet Take by mouth.    Marland Kitchen ketoconazole (NIZORAL) 2 % shampoo   99  . Magnesium 500 MG CAPS Take 500 mg by mouth daily.    . montelukast (SINGULAIR) 10 MG tablet   5  . nystatin (MYCOSTATIN) 500000 units TABS tablet     . Omega-3 Fatty Acids (OMEGA 3 PO) Take 1 capsule by mouth 2 (two) times daily.    . Potassium 99 MG TABS Take 99 mg by mouth daily.    . Theanine 100 MG CAPS Take 100 mg by mouth 2 (two) times daily.    . valACYclovir (VALTREX) 500 MG tablet Take 1 tablet (  500 mg total) by mouth daily. 90 tablet 1  . vitamin C (ASCORBIC ACID) 500 MG tablet Take 500 mg by mouth daily.    Marland Kitchen zinc gluconate 50 MG tablet Take 50 mg by mouth daily.     No current facility-administered medications on file prior to visit.     Past Medical History:  Diagnosis Date  . Acetabular labrum tear    right/ per pt history  . Arthritis   . Gall bladder disease    per pt report  . Ganglion cyst   . GERD (gastroesophageal reflux disease)   . Hemorrhoids   . Low back pain 08/20/2016  . Menopause   . Osteoporosis   . Telogen effluvium   . Thyroid disease     Past Surgical History:  Procedure Laterality Date  . BREAST SURGERY  2002   cyst on Left breast  . CYSTECTOMY  1999   Right elbow  . HEMORRHOID BANDING   07/2014  . JOINT REPLACEMENT    . KNEE ARTHROSCOPY Left 09/10/2013   Procedure: LEFT MEDIAL AND LATERAL KNEE ARTHROSCOPY WITH MENISCAL DEBRIDEMENT ;  Surgeon: Gearlean Alf, MD;  Location: [facility];  Service: Orthopedics;  Laterality: Left;  . NASAL SINUS SURGERY  1995, 2002  . TOTAL HIP ARTHROPLASTY  09/12/2011   Procedure: TOTAL HIP ARTHROPLASTY ANTERIOR APPROACH;  Surgeon: Mauri Pole, MD;  Location: [facility];  Service: Orthopedics;  Laterality: Left;    Social History   Socioeconomic History  . Marital status: Divorced    Spouse name: Not on file  . Number of children: Not on file  . Years of education: Not on file  . Highest education level: Not on file  Occupational History  . Occupation: Optometrist  Social Needs  . Financial resource strain: Not on file  . Food insecurity    Worry: Not on file    Inability: Not on file  . Transportation needs    Medical: Not on file    Non-medical: Not on file  Tobacco Use  . Smoking status: Never Smoker  . Smokeless tobacco: Never Used  Substance and Sexual Activity  . Alcohol use: Yes    Alcohol/week: 1.0 standard drinks    Types: 1 Standard drinks or equivalent per week    Comment: once weekly or 2-3 drinks per month  . Drug use: No  . Sexual activity: Not Currently  Lifestyle  . Physical activity    Days per week: Not on file    Minutes per session: Not on file  . Stress: Not on file  Relationships  . Social Herbalist on phone: Not on file    Gets together: Not on file    Attends religious service: Not on file    Active member of club or organization: Not on file    Attends meetings of clubs or organizations: Not on file    Relationship status: Not on file  Other Topics Concern  . Not on file  Social History Narrative  . Not on file    Family History  Problem Relation Age of Onset  . Stroke Mother   . Nephrolithiasis Mother   . Diabetes Mother   . Aneurysm Mother        x2  . Cancer Mother         lymphoma, breast cancer  . Depression Father   . Parkinsonism Father   . Drug abuse Father   . Parkinson's disease Brother   .  Mental illness Sister        paranoia    Review of Systems  Constitutional: Negative for chills and fever.  Eyes: Negative for visual disturbance.  Respiratory: Negative for cough, shortness of breath and wheezing.   Cardiovascular: Negative for chest pain, palpitations and leg swelling.  Gastrointestinal: Negative for abdominal pain, blood in stool, constipation, diarrhea and nausea.       Occ gerd - certain foods  Genitourinary: Positive for frequency. Negative for dysuria and hematuria.  Musculoskeletal: Positive for arthralgias and neck pain.  Skin: Negative for rash.       Scalp irritation - folliculitis  Neurological: Positive for headaches (occ - sinus). Negative for dizziness and light-headedness.  Psychiatric/Behavioral: Positive for dysphoric mood (low grade).       Objective:   Vitals:   02/13/19 0807  BP: 114/80  Pulse: 74  Temp: 98.2 F (36.8 C)  SpO2: 98%   Filed Weights   02/13/19 0807  Weight: 132 lb 8 oz (60.1 kg)   Body mass index is 20.75 kg/m.  BP Readings from Last 3 Encounters:  02/13/19 114/80  02/19/18 112/80  02/11/18 108/72    Wt Readings from Last 3 Encounters:  02/13/19 132 lb 8 oz (60.1 kg)  02/19/18 123 lb (55.8 kg)  02/11/18 131 lb 12.8 oz (59.8 kg)     Office Visit from 02/13/2019 in [place]  PHQ-2 Total Score  0        Physical Exam Constitutional: She appears well-developed and well-nourished. No distress.  HENT:  Head: Normocephalic and atraumatic.  Right Ear: External ear normal. Normal ear canal and TM Left Ear: External ear normal.  Normal ear canal and TM Mouth/Throat: Oropharynx is clear and moist.  Eyes: Conjunctivae and EOM are normal.  Neck: Neck supple. No tracheal deviation present. No thyromegaly present.  No carotid bruit  Cardiovascular: Normal  rate, regular rhythm and normal heart sounds.   No murmur heard.  No edema. Pulmonary/Chest: Effort normal and breath sounds normal. No respiratory distress. She has no wheezes. She has no rales.  Breast: deferred   Abdominal: Soft. She exhibits no distension. There is no tenderness.  Lymphadenopathy: She has no cervical adenopathy.  Skin: Skin is warm and dry. She is not diaphoretic.  Psychiatric: She has a normal mood and affect. Her behavior is normal.        Assessment & Plan:   Health Maintenance  Topic Date Due  . MAMMOGRAM  01/23/2019  . INFLUENZA VACCINE  07/24/2019 (Originally 11/23/2018)  . DEXA SCAN  09/14/2019  . PNA vac Low Risk Adult (2 of 2 - PPSV23) 01/17/2020  . COLONOSCOPY  06/15/2024  . TETANUS/TDAP  09/19/2027  . Hepatitis C Screening  Completed  . HIV Screening  Completed     Physical exam: Screening blood work    ordered Immunizations  Flu vaccine today, discussed shingrix Colonoscopy  Up to date  Mammogram  Done yesterday-we will get report Dexa    Up to date  Gyn Up to date  Eye exam   Up to date  Exercise  Walks 1 hr 5 days a week, push ups Weight  Normal bmi Substance abuse  none  See Problem List for Assessment and Plan of chronic medical problems.   FU in one year

## 2019-02-12 NOTE — Patient Instructions (Addendum)
Tests ordered today. Your results will be released to MyChart (or called to you) after review.  If any changes need to be made, you will be notified at that same time.  All other Health Maintenance issues reviewed.   All recommended immunizations and age-appropriate screenings are up-to-date or discussed.  Flu immunization administered today.    Medications reviewed and updated.  Changes include :   none  Your prescription(s) have been submitted to your pharmacy. Please take as directed and contact our office if you believe you are having problem(s) with the medication(s).    Please followup in 1 year    Health Maintenance, Female Adopting a healthy lifestyle and getting preventive care are important in promoting health and wellness. Ask your health care provider about:  The right schedule for you to have regular tests and exams.  Things you can do on your own to prevent diseases and keep yourself healthy. What should I know about diet, weight, and exercise? Eat a healthy diet   Eat a diet that includes plenty of vegetables, fruits, low-fat dairy products, and lean protein.  Do not eat a lot of foods that are high in solid fats, added sugars, or sodium. Maintain a healthy weight Body mass index (BMI) is used to identify weight problems. It estimates body fat based on height and weight. Your health care provider can help determine your BMI and help you achieve or maintain a healthy weight. Get regular exercise Get regular exercise. This is one of the most important things you can do for your health. Most adults should:  Exercise for at least 150 minutes each week. The exercise should increase your heart rate and make you sweat (moderate-intensity exercise).  Do strengthening exercises at least twice a week. This is in addition to the moderate-intensity exercise.  Spend less time sitting. Even light physical activity can be beneficial. Watch cholesterol and blood lipids Have  your blood tested for lipids and cholesterol at 66 years of age, then have this test every 5 years. Have your cholesterol levels checked more often if:  Your lipid or cholesterol levels are high.  You are older than 66 years of age.  You are at high risk for heart disease. What should I know about cancer screening? Depending on your health history and family history, you may need to have cancer screening at various ages. This may include screening for:  Breast cancer.  Cervical cancer.  Colorectal cancer.  Skin cancer.  Lung cancer. What should I know about heart disease, diabetes, and high blood pressure? Blood pressure and heart disease  High blood pressure causes heart disease and increases the risk of stroke. This is more likely to develop in people who have high blood pressure readings, are of African descent, or are overweight.  Have your blood pressure checked: ? Every 3-5 years if you are 18-39 years of age. ? Every year if you are 40 years old or older. Diabetes Have regular diabetes screenings. This checks your fasting blood sugar level. Have the screening done:  Once every three years after age 40 if you are at a normal weight and have a low risk for diabetes.  More often and at a younger age if you are overweight or have a high risk for diabetes. What should I know about preventing infection? Hepatitis B If you have a higher risk for hepatitis B, you should be screened for this virus. Talk with your health care provider to find out if you are   at risk for hepatitis B infection. Hepatitis C Testing is recommended for:  Everyone born from 1945 through 1965.  Anyone with known risk factors for hepatitis C. Sexually transmitted infections (STIs)  Get screened for STIs, including gonorrhea and chlamydia, if: ? You are sexually active and are younger than 66 years of age. ? You are older than 66 years of age and your health care provider tells you that you are at  risk for this type of infection. ? Your sexual activity has changed since you were last screened, and you are at increased risk for chlamydia or gonorrhea. Ask your health care provider if you are at risk.  Ask your health care provider about whether you are at high risk for HIV. Your health care provider may recommend a prescription medicine to help prevent HIV infection. If you choose to take medicine to prevent HIV, you should first get tested for HIV. You should then be tested every 3 months for as long as you are taking the medicine. Pregnancy  If you are about to stop having your period (premenopausal) and you may become pregnant, seek counseling before you get pregnant.  Take 400 to 800 micrograms (mcg) of folic acid every day if you become pregnant.  Ask for birth control (contraception) if you want to prevent pregnancy. Osteoporosis and menopause Osteoporosis is a disease in which the bones lose minerals and strength with aging. This can result in bone fractures. If you are 65 years old or older, or if you are at risk for osteoporosis and fractures, ask your health care provider if you should:  Be screened for bone loss.  Take a calcium or vitamin D supplement to lower your risk of fractures.  Be given hormone replacement therapy (HRT) to treat symptoms of menopause. Follow these instructions at home: Lifestyle  Do not use any products that contain nicotine or tobacco, such as cigarettes, e-cigarettes, and chewing tobacco. If you need help quitting, ask your health care provider.  Do not use street drugs.  Do not share needles.  Ask your health care provider for help if you need support or information about quitting drugs. Alcohol use  Do not drink alcohol if: ? Your health care provider tells you not to drink. ? You are pregnant, may be pregnant, or are planning to become pregnant.  If you drink alcohol: ? Limit how much you use to 0-1 drink a day. ? Limit intake if you  are breastfeeding.  Be aware of how much alcohol is in your drink. In the [place] one drink equals one 12 oz bottle of beer (355 mL), one 5 oz glass of wine (148 mL), or one 1 oz glass of hard liquor (44 mL). General instructions  Schedule regular health, dental, and eye exams.  Stay current with your vaccines.  Tell your health care provider if: ? You often feel depressed. ? You have ever been abused or do not feel safe at home. Summary  Adopting a healthy lifestyle and getting preventive care are important in promoting health and wellness.  Follow your health care provider's instructions about healthy diet, exercising, and getting tested or screened for diseases.  Follow your health care provider's instructions on monitoring your cholesterol and blood pressure. This information is not intended to replace advice given to you by your health care provider. Make sure you discuss any questions you have with your health care provider. Document Released: 10/24/2010 Document Revised: 04/03/2018 Document Reviewed: 04/03/2018 Elsevier Patient Education  2020   Inc.  

## 2019-02-13 ENCOUNTER — Other Ambulatory Visit (INDEPENDENT_AMBULATORY_CARE_PROVIDER_SITE_OTHER): Payer: BLUE CROSS/BLUE SHIELD

## 2019-02-13 ENCOUNTER — Ambulatory Visit (INDEPENDENT_AMBULATORY_CARE_PROVIDER_SITE_OTHER): Payer: BLUE CROSS/BLUE SHIELD | Admitting: Internal Medicine

## 2019-02-13 ENCOUNTER — Encounter: Payer: Self-pay | Admitting: Internal Medicine

## 2019-02-13 ENCOUNTER — Other Ambulatory Visit: Payer: Self-pay

## 2019-02-13 VITALS — BP 114/80 | HR 74 | Temp 98.2°F | Ht 67.0 in | Wt 132.5 lb

## 2019-02-13 DIAGNOSIS — Z0000 Encounter for general adult medical examination without abnormal findings: Secondary | ICD-10-CM | POA: Diagnosis not present

## 2019-02-13 DIAGNOSIS — E0590 Thyrotoxicosis, unspecified without thyrotoxic crisis or storm: Secondary | ICD-10-CM | POA: Diagnosis not present

## 2019-02-13 DIAGNOSIS — R208 Other disturbances of skin sensation: Secondary | ICD-10-CM

## 2019-02-13 DIAGNOSIS — M810 Age-related osteoporosis without current pathological fracture: Secondary | ICD-10-CM

## 2019-02-13 DIAGNOSIS — Z23 Encounter for immunization: Secondary | ICD-10-CM

## 2019-02-13 LAB — CBC WITH DIFFERENTIAL/PLATELET
Basophils Absolute: 0 10*3/uL (ref 0.0–0.1)
Basophils Relative: 1 % (ref 0.0–3.0)
Eosinophils Absolute: 0.2 10*3/uL (ref 0.0–0.7)
Eosinophils Relative: 5 % (ref 0.0–5.0)
HCT: 40.7 % (ref 36.0–46.0)
Hemoglobin: 13.4 g/dL (ref 12.0–15.0)
Lymphocytes Relative: 25.5 % (ref 12.0–46.0)
Lymphs Abs: 1 10*3/uL (ref 0.7–4.0)
MCHC: 32.8 g/dL (ref 30.0–36.0)
MCV: 89.2 fl (ref 78.0–100.0)
Monocytes Absolute: 0.3 10*3/uL (ref 0.1–1.0)
Monocytes Relative: 6.9 % (ref 3.0–12.0)
Neutro Abs: 2.5 10*3/uL (ref 1.4–7.7)
Neutrophils Relative %: 61.6 % (ref 43.0–77.0)
Platelets: 217 10*3/uL (ref 150.0–400.0)
RBC: 4.56 Mil/uL (ref 3.87–5.11)
RDW: 13.1 % (ref 11.5–15.5)
WBC: 4 10*3/uL (ref 4.0–10.5)

## 2019-02-13 LAB — COMPREHENSIVE METABOLIC PANEL
ALT: 16 U/L (ref 0–35)
AST: 21 U/L (ref 0–37)
Albumin: 4.6 g/dL (ref 3.5–5.2)
Alkaline Phosphatase: 48 U/L (ref 39–117)
BUN: 17 mg/dL (ref 6–23)
CO2: 29 mEq/L (ref 19–32)
Calcium: 9.3 mg/dL (ref 8.4–10.5)
Chloride: 104 mEq/L (ref 96–112)
Creatinine, Ser: 0.84 mg/dL (ref 0.40–1.20)
GFR: 67.84 mL/min (ref >60.00)
Glucose, Bld: 99 mg/dL (ref 70–99)
Potassium: 4.7 mEq/L (ref 3.5–5.1)
Sodium: 141 mEq/L (ref 135–145)
Total Bilirubin: 1 mg/dL (ref 0.2–1.2)
Total Protein: 6.8 g/dL (ref 6.0–8.3)

## 2019-02-13 LAB — T4, FREE: Free T4: 0.72 ng/dL (ref 0.60–1.60)

## 2019-02-13 LAB — VITAMIN D 25 HYDROXY (VIT D DEFICIENCY, FRACTURES): VITD: 67.04 ng/mL (ref 30.00–100.00)

## 2019-02-13 LAB — LIPID PANEL
Cholesterol: 149 mg/dL (ref 0–200)
HDL: 60.7 mg/dL (ref >39.00)
LDL Cholesterol: 78 mg/dL (ref 0–99)
NonHDL: 88.11
Total CHOL/HDL Ratio: 2
Triglycerides: 50 mg/dL (ref 0.0–149.0)
VLDL: 10 mg/dL (ref 0.0–40.0)

## 2019-02-13 LAB — TSH: TSH: 2.21 u[IU]/mL (ref 0.35–4.50)

## 2019-02-13 LAB — T3, FREE: T3, Free: 3.2 pg/mL (ref 2.3–4.2)

## 2019-02-13 MED ORDER — RESTORA RX 60-1.25 MG PO CAPS: ORAL_CAPSULE | ORAL | 3 refills | Status: DC

## 2019-02-13 MED ORDER — METHIMAZOLE 5 MG PO TABS: 2.5000 mg | ORAL_TABLET | Freq: Every day | ORAL | 3 refills | Status: DC

## 2019-02-13 MED ORDER — AMITRIPTYLINE HCL 10 MG PO TABS: 10.0000 mg | ORAL_TABLET | Freq: Every day | ORAL | 3 refills | Status: DC

## 2019-02-13 MED ORDER — ALENDRONATE SODIUM 70 MG PO TABS: 70.0000 mg | ORAL_TABLET | ORAL | 3 refills | Status: DC

## 2019-02-13 NOTE — Assessment & Plan Note (Addendum)
Started on fosamax 2017 - continue for 5 years dexa due next year Walking regularly, doing push-ups Check blood work, including vitamin D, CBC, CMP

## 2019-02-13 NOTE — Assessment & Plan Note (Addendum)
No longer seeing endocrine Clinically euthyroid Check tsh, ft3, ft4  Titrate med dose if needed

## 2019-02-13 NOTE — Assessment & Plan Note (Signed)
Many factors influencing this-allergies, stress, environmental factors Taking amitriptyline 10-20 mg at night, which does seem to help Continue

## 2019-02-25 ENCOUNTER — Encounter: Payer: Self-pay | Admitting: Internal Medicine

## 2019-04-09 LAB — HM MAMMOGRAPHY

## 2019-04-15 NOTE — Progress Notes (Signed)
Subjective:    Patient ID: Patricia Conley, female    DOB: [birth year], 68 y.o.   MRN: [id number]  HPI The patient is here for an acute visit.  Enlarged lymph node in neck:  It has been enlarged for a while.  It has been larger recently- the past couple of weeks.  A few days ago she had felt pressure in her eustachian tube.  She denies any pressure or pain in the ear.  The lymph node is not tender.  She denies any other palpable lymph nodes.  She does have a multinodular goiter and has had a biopsy before.  She has had ultrasounds of her thyroid in the past and has not seen ENT for at least a couple of years.  She has not noted any change in her thyroid.   Medications and allergies reviewed with patient and updated if appropriate.  Patient Active Problem List   Diagnosis Date Noted  . 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 Burning sensation of skin, scalp 05/15/2017  . 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 Anxiety 12/22/2016  . Xerostomia 10/18/2016  . Allergic rhinitis caused by mold 10/18/2016  . Ganglion cyst of finger of right hand 08/08/2016  . Peroneal tendinitis, right leg 06/12/2016  . Loss of transverse plantar arch 06/12/2016  . Mass of joint of finger 04/16/2016  . Pap smear abnormality of cervix with ASCUS favoring benign 02/08/2016  . OSA (obstructive sleep apnea) 12/28/2015  . Palpitations 08/05/2015  . Genital herpes 07/27/2015  . Abdominal pain, left upper quadrant 05/19/2015  . Osteoporosis 03/25/2015  . Dry eye 03/25/2015  . Hyperthyroidism 01/06/2015  . Hair loss 09/18/2014  . 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 Acute medial meniscal tear 09/10/2013  . Bunion of great toe 11/15/2012  . DJD (degenerative joint disease) 11/11/2012  . History of gout 11/11/2012  . GERD (gastroesophageal reflux disease) 10/27/2012  . S/P left THA, AA 09/12/2011  . Hemorrhoids, external without complications [id number]  . Osteoarthritis of hip 03/14/2011  . Renal cyst  12/15/2010  . Adrenal nodule ([place]) 12/15/2010  . Liver hemangioma 12/15/2010  . Benign breast cyst in female   . Gall bladder disease     Current Outpatient Medications on File Prior to Visit  Medication Sig Dispense Refill  . alendronate (FOSAMAX) 70 MG tablet Take 1 tablet (70 mg total) by mouth once a week. Take with a full glass of water on an empty stomach. 12 tablet 3  . amitriptyline (ELAVIL) 10 MG tablet Take 1 tablet (10 mg total) by mouth at bedtime. 90 tablet 3  . b complex vitamins capsule Take 1 capsule by mouth daily.    . Biotin 1 MG CAPS Take by mouth.    . Cholecalciferol ([employer] VIT D 5000 HIGH-POTENCY PO) Take 5,000 Units by mouth daily.    . cholestyramine (QUESTRAN) 4 GM/DOSE powder once daily    . Diclofenac Sodium 2 % SOLN Place 2 g onto the skin 2 (two) times daily. 112 g 3  . EVENING PRIMROSE OIL PO Take by mouth.    . Famotidine (PEPCID PO) Take 10 mg by mouth 2 (two) times daily.    . fluocinonide (LIDEX) 0.05 % external solution   6  . folic acid (FOLVITE) 1 MG tablet Take by mouth.    Marland Kitchen ketoconazole (NIZORAL) 2 % shampoo   99  . Lactobacillus Casei-Folic Acid (RESTORA RX) 60-1.25 MG CAPS TAKE (1) CAPSULE DAILY. 90 capsule 3  . Magnesium 500  MG CAPS Take 500 mg by mouth daily.    . methimazole (TAPAZOLE) 5 MG tablet Take 0.5 tablets (2.5 mg total) by mouth daily. 45 tablet 3  . montelukast (SINGULAIR) 10 MG tablet   5  . nystatin (MYCOSTATIN) 500000 units TABS tablet     . Omega-3 Fatty Acids (OMEGA 3 PO) Take 1 capsule by mouth 2 (two) times daily.    . Potassium 99 MG TABS Take 99 mg by mouth daily.    . Theanine 100 MG CAPS Take 100 mg by mouth 2 (two) times daily.    . valACYclovir (VALTREX) 500 MG tablet Take 1 tablet (500 mg total) by mouth daily. 90 tablet 1  . vitamin C (ASCORBIC ACID) 500 MG tablet Take 500 mg by mouth daily.    Marland Kitchen zinc gluconate 50 MG tablet Take 50 mg by mouth daily.     No current facility-administered medications on file prior to  visit.    Past Medical History:  Diagnosis Date  . Acetabular labrum tear    right/ per pt history  . Arthritis   . Gall bladder disease    per pt report  . Ganglion cyst   . GERD (gastroesophageal reflux disease)   . Hemorrhoids   . Low back pain 08/20/2016  . Menopause   . Osteoporosis   . Telogen effluvium   . Thyroid disease     Past Surgical History:  Procedure Laterality Date  . BREAST SURGERY  2002   cyst on Left breast  . CYSTECTOMY  1999   Right elbow  . HEMORRHOID BANDING  07/2014  . JOINT REPLACEMENT    . KNEE ARTHROSCOPY Left 09/10/2013   Procedure: LEFT MEDIAL AND LATERAL KNEE ARTHROSCOPY WITH MENISCAL DEBRIDEMENT ;  Surgeon: Gearlean Alf, MD;  Location: [facility];  Service: Orthopedics;  Laterality: Left;  . NASAL SINUS SURGERY  1995, 2002  . TOTAL HIP ARTHROPLASTY  09/12/2011   Procedure: TOTAL HIP ARTHROPLASTY ANTERIOR APPROACH;  Surgeon: Mauri Pole, MD;  Location: [facility];  Service: Orthopedics;  Laterality: Left;    Social History   Socioeconomic History  . Marital status: Divorced    Spouse name: Not on file  . Number of children: Not on file  . Years of education: Not on file  . Highest education level: Not on file  Occupational History  . Occupation: Optometrist  Tobacco Use  . Smoking status: Never Smoker  . Smokeless tobacco: Never Used  Substance and Sexual Activity  . Alcohol use: Yes    Alcohol/week: 1.0 standard drinks    Types: 1 Standard drinks or equivalent per week    Comment: once weekly or 2-3 drinks per month  . Drug use: No  . Sexual activity: Not Currently  Other Topics Concern  . Not on file  Social History Narrative  . Not on file   Social Determinants of Health   Financial Resource Strain:   . Difficulty of Paying Living Expenses: Not on file  Food Insecurity:   . Worried About Charity fundraiser in the Last Year: Not on file  . Ran Out of Food in the Last Year: Not on file  Transportation Needs:   . Lack of  Transportation (Medical): Not on file  . Lack of Transportation (Non-Medical): Not on file  Physical Activity:   . Days of Exercise per Week: Not on file  . Minutes of Exercise per Session: Not on file  Stress:   . Feeling of  Stress : Not on file  Social Connections:   . Frequency of Communication with Friends and Family: Not on file  . Frequency of Social Gatherings with Friends and Family: Not on file  . Attends Religious Services: Not on file  . Active Member of Clubs or Organizations: Not on file  . Attends Archivist Meetings: Not on file  . Marital Status: Not on file    Family History  Problem Relation Age of Onset  . Stroke Mother   . Nephrolithiasis Mother   . Diabetes Mother   . Aneurysm Mother        x2  . Cancer Mother        lymphoma, breast cancer  . Depression Father   . Parkinsonism Father   . Drug abuse Father   . Parkinson's disease Brother   . Mental illness Sister        paranoia    Review of Systems  Constitutional: Positive for chills. Negative for fever.  HENT: Negative for congestion, ear pain (? ET pressure), sinus pain and sore throat.   Respiratory: Negative for cough, shortness of breath and wheezing.   Neurological: Negative for headaches.       Objective:   Vitals:   04/16/19 0808  BP: 124/78  Pulse: (!) 102  Resp: 16  Temp: 98.1 F (36.7 C)  SpO2: 99%   BP Readings from Last 3 Encounters:  04/16/19 124/78  02/13/19 114/80  02/19/18 112/80   Wt Readings from Last 3 Encounters:  04/16/19 135 lb 12.8 oz (61.6 kg)  02/13/19 132 lb 8 oz (60.1 kg)  02/19/18 123 lb (55.8 kg)   Body mass index is 21.27 kg/m.   Physical Exam Constitutional:      General: She is not in acute distress.    Appearance: Normal appearance.  HENT:     Head: Normocephalic and atraumatic.     Right Ear: Ear canal and external ear normal.     Left Ear: Ear canal and external ear normal.     Nose: Nose normal.     Mouth/Throat:     Mouth:  Mucous membranes are moist.     Pharynx: No oropharyngeal exudate or posterior oropharyngeal erythema.  Eyes:     Conjunctiva/sclera: Conjunctivae normal.  Neck:     Comments: Thyroid feels normal size, but there are some palpable nodules Lymphadenopathy:     Cervical: Cervical adenopathy (Mild right anterior cervical lymphadenopathy-nontender-difficult to tell if it is lymphadenopathy or possible thyroid) present.  Skin:    General: Skin is warm and dry.  Neurological:     Mental Status: She is alert.            Assessment & Plan:    See Problem List for Assessment and Plan of chronic medical problems.    This visit occurred during the SARS-CoV-2 public health emergency.  Safety protocols were in place, including screening questions prior to the visit, additional usage of staff PPE, and extensive cleaning of exam room while observing appropriate contact time as indicated for disinfecting solutions.

## 2019-04-16 ENCOUNTER — Other Ambulatory Visit: Payer: Self-pay

## 2019-04-16 ENCOUNTER — Other Ambulatory Visit: Payer: Self-pay | Admitting: Internal Medicine

## 2019-04-16 ENCOUNTER — Ambulatory Visit (INDEPENDENT_AMBULATORY_CARE_PROVIDER_SITE_OTHER): Payer: BLUE CROSS/BLUE SHIELD | Admitting: Internal Medicine

## 2019-04-16 ENCOUNTER — Encounter: Payer: Self-pay | Admitting: Internal Medicine

## 2019-04-16 VITALS — BP 124/78 | HR 102 | Temp 98.1°F | Resp 16 | Ht 67.0 in | Wt 135.8 lb

## 2019-04-16 DIAGNOSIS — E0520 Thyrotoxicosis with toxic multinodular goiter without thyrotoxic crisis or storm: Secondary | ICD-10-CM | POA: Diagnosis not present

## 2019-04-16 DIAGNOSIS — R590 Localized enlarged lymph nodes: Secondary | ICD-10-CM | POA: Diagnosis not present

## 2019-04-16 NOTE — Assessment & Plan Note (Signed)
Right anterior cervical lymphadenopathy versus multinodular thyroid Ultrasound of neck to evaluate further Nontender, seems to vary in size May need to see ENT depending on ultrasound

## 2019-04-16 NOTE — Patient Instructions (Signed)
An [place] of your neck was ordered - [facility] will call you to schedule it.

## 2019-04-16 NOTE — Assessment & Plan Note (Signed)
Multinodular thyroid Has seen ENT and has had biopsy in the past No obvious change per patient We will do a follow-up ultrasound given node versus nodule

## 2019-04-21 ENCOUNTER — Encounter: Payer: Self-pay | Admitting: Internal Medicine

## 2019-04-21 ENCOUNTER — Other Ambulatory Visit: Payer: Self-pay | Admitting: Internal Medicine

## 2019-04-21 ENCOUNTER — Ambulatory Visit
Admission: RE | Admit: 2019-04-21 | Discharge: 2019-04-21 | Disposition: A | Discharge status: Home / Self Care | Payer: BLUE CROSS/BLUE SHIELD | Source: Ambulatory Visit | Attending: Internal Medicine | Admitting: Internal Medicine

## 2019-04-21 DIAGNOSIS — R590 Localized enlarged lymph nodes: Secondary | ICD-10-CM

## 2019-04-21 DIAGNOSIS — E0520 Thyrotoxicosis with toxic multinodular goiter without thyrotoxic crisis or storm: Secondary | ICD-10-CM

## 2019-04-24 ENCOUNTER — Other Ambulatory Visit: Payer: Self-pay | Admitting: Otolaryngology

## 2019-04-24 DIAGNOSIS — R070 Pain in throat: Secondary | ICD-10-CM

## 2019-04-30 ENCOUNTER — Other Ambulatory Visit: Payer: BLUE CROSS/BLUE SHIELD

## 2019-05-01 ENCOUNTER — Encounter: Payer: Self-pay | Admitting: Internal Medicine

## 2019-05-01 ENCOUNTER — Ambulatory Visit (INDEPENDENT_AMBULATORY_CARE_PROVIDER_SITE_OTHER): Payer: BLUE CROSS/BLUE SHIELD | Admitting: Internal Medicine

## 2019-05-01 ENCOUNTER — Other Ambulatory Visit: Payer: Self-pay

## 2019-05-01 ENCOUNTER — Ambulatory Visit (INDEPENDENT_AMBULATORY_CARE_PROVIDER_SITE_OTHER): Payer: BLUE CROSS/BLUE SHIELD

## 2019-05-01 VITALS — BP 118/80 | HR 80 | Temp 97.6°F | Resp 16 | Ht 67.0 in | Wt 136.0 lb

## 2019-05-01 DIAGNOSIS — L03011 Cellulitis of right finger: Secondary | ICD-10-CM | POA: Insufficient documentation

## 2019-05-01 MED ORDER — SULFAMETHOXAZOLE-TRIMETHOPRIM 800-160 MG PO TABS: 2.0000 | ORAL_TABLET | Freq: Two times a day (BID) | ORAL | 0 refills | Status: AC

## 2019-05-01 NOTE — Patient Instructions (Signed)
Paronychia Paronychia is an infection of the skin that surrounds a nail. It usually affects the skin around a fingernail, but it may also occur near a toenail. It often causes pain and swelling around the nail. In some cases, a collection of pus (abscess) can form near or under the nail.  This condition may develop suddenly, or it may develop gradually over a longer period. In most cases, paronychia is not serious, and it will clear up with treatment. What are the causes? This condition may be caused by bacteria or a fungus. These germs can enter the body through an opening in the skin, such as a cut or a hangnail. What increases the risk? This condition is more likely to develop in people who:  Get their hands wet often, such as those who work as dishwashers, bartenders, or nurses.  Bite their fingernails or suck their thumbs.  Trim their nails very [place].  Have hangnails or injured fingertips.  Get manicures.  Have diabetes. What are the signs or symptoms? Symptoms of this condition include:  Redness and swelling of the skin near the nail.  Tenderness around the nail when you touch the area.  Pus-filled bumps under the skin at the base and sides of the nail (cuticle).  Fluid or pus under the nail.  Throbbing pain in the area. How is this diagnosed? This condition is diagnosed with a physical exam. In some cases, a sample of pus may be tested to determine what type of bacteria or fungus is causing the condition. How is this treated? Treatment depends on the cause and severity of your condition. If your condition is mild, it may clear up on its own in a few days or after soaking in warm water. If needed, treatment may include:  Antibiotic medicine, if your infection is caused by bacteria.  Antifungal medicine, if your infection is caused by a fungus.  A procedure to drain pus from an abscess.  Anti-inflammatory medicine (corticosteroids). Follow these instructions at  home: Wound care  Keep the affected area clean.  Soak the affected area in warm water, if told to do so by your health care provider. You may be told to do this for 20 minutes, 2-3 times a day.  Keep the area dry when you are not soaking it.  Do not try to drain an abscess yourself.  Follow instructions from your health care provider about how to take care of the affected area. Make sure you: ? Wash your hands with soap and water before you change your bandage (dressing). If soap and water are not available, use hand sanitizer. ? Change your dressing as told by your health care provider.  If you had an abscess drained, check the area every day for signs of infection. Check for: ? Redness, swelling, or pain. ? Fluid or blood. ? Warmth. ? Pus or a bad smell. Medicines   Take over-the-counter and prescription medicines only as told by your health care provider.  If you were prescribed an antibiotic medicine, take it as told by your health care provider. Do not stop taking the antibiotic even if you start to feel better. General instructions  Avoid contact with harsh chemicals.  Do not pick at the affected area. Prevention  To prevent this condition from happening again: ? Wear rubber gloves when washing dishes or doing other tasks that require your hands to get wet. ? Wear gloves if your hands might come in contact with cleaners or other chemicals. ? Avoid   injuring your nails or fingertips. ? Do not bite your nails or tear hangnails. ? Do not cut your nails very [place]. ? Do not cut your cuticles. ? Use clean nail clippers or scissors when trimming nails. Contact a health care provider if:  Your symptoms get worse or do not improve with treatment.  You have continued or increased fluid, blood, or pus coming from the affected area.  Your finger or knuckle becomes swollen or difficult to move. Get help right away if you have:  A fever or chills.  Redness spreading away  from the affected area.  Joint or muscle pain. Summary  Paronychia is an infection of the skin that surrounds a nail. It often causes pain and swelling around the nail. In some cases, a collection of pus (abscess) can form near or under the nail.  This condition may be caused by bacteria or a fungus. These germs can enter the body through an opening in the skin, such as a cut or a hangnail.  If your condition is mild, it may clear up on its own in a few days. If needed, treatment may include medicine or a procedure to drain pus from an abscess.  To prevent this condition from happening again, wear gloves if doing tasks that require your hands to get wet or to come in contact with chemicals. Also avoid injuring your nails or fingertips. This information is not intended to replace advice given to you by your health care provider. Make sure you discuss any questions you have with your health care provider. Document Revised: 04/27/2017 Document Reviewed: 04/23/2017 Elsevier Patient Education  2020 [employer]vier Inc.  

## 2019-05-01 NOTE — Progress Notes (Signed)
Subjective:  Patient ID: Patricia Conley, female    DOB: [birth year]  Age: 69 y.o. MRN: [id number]  CC: Hand Pain  This visit occurred during the SARS-CoV-2 public health emergency.  Safety protocols were in place, including screening questions prior to the visit, additional usage of staff PPE, and extensive cleaning of exam room while observing appropriate contact time as indicated for disinfecting solutions.    HPI Patricia Conley presents for concerns about her right middle finger.  She complains of a 5-day history of pain and swelling around the fingernail.  She denies trauma or injury.  Outpatient Medications Prior to Visit  Medication Sig Dispense Refill  . alendronate (FOSAMAX) 70 MG tablet Take 1 tablet (70 mg total) by mouth once a week. Take with a full glass of water on an empty stomach. 12 tablet 3  . amitriptyline (ELAVIL) 10 MG tablet Take 1 tablet (10 mg total) by mouth at bedtime. 90 tablet 3  . b complex vitamins capsule Take 1 capsule by mouth daily.    . Biotin 1 MG CAPS Take by mouth.    . Cholecalciferol ([employer]S VIT D 5000 HIGH-POTENCY PO) Take 5,000 Units by mouth daily.    Marland Kitchen EVENING PRIMROSE OIL PO Take by mouth.    . fluocinonide (LIDEX) 0.05 % external solution   6  . Lactobacillus Casei-Folic Acid (RESTORA RX) 60-1.25 MG CAPS TAKE (1) CAPSULE DAILY. 90 capsule 3  . Magnesium 500 MG CAPS Take 500 mg by mouth daily.    . methimazole (TAPAZOLE) 5 MG tablet Take 0.5 tablets (2.5 mg total) by mouth daily. 45 tablet 3  . nystatin (MYCOSTATIN) 500000 units TABS tablet     . Potassium 99 MG TABS Take 99 mg by mouth daily.    . Potassium Gluconate 2.5 MEQ TABS Take by mouth.    . Theanine 100 MG CAPS Take 100 mg by mouth 2 (two) times daily.    . valACYclovir (VALTREX) 500 MG tablet Take 1 tablet (500 mg total) by mouth daily. 90 tablet 1  . vitamin C (ASCORBIC ACID) 500 MG tablet Take 500 mg by mouth daily.    . cholestyramine (QUESTRAN) 4 GM/DOSE powder once  daily    . Diclofenac Sodium 2 % SOLN Place 2 g onto the skin 2 (two) times daily. 112 g 3  . Famotidine (PEPCID PO) Take 10 mg by mouth 2 (two) times daily.    . folic acid (FOLVITE) 1 MG tablet Take by mouth.    Marland Kitchen ketoconazole (NIZORAL) 2 % shampoo   99  . montelukast (SINGULAIR) 10 MG tablet   5  . Omega-3 Fatty Acids (OMEGA 3 PO) Take 1 capsule by mouth 2 (two) times daily.    Marland Kitchen zinc gluconate 50 MG tablet Take 50 mg by mouth daily.     No facility-administered medications prior to visit.    ROS Review of Systems  Constitutional: Negative.  Negative for chills, fatigue and fever.  HENT: Negative.   Eyes: Negative.   Respiratory: Negative.   Cardiovascular: Negative.  Negative for chest pain and leg swelling.  Gastrointestinal: Negative.  Negative for abdominal pain.  Endocrine: Negative.   Genitourinary: Negative.   Musculoskeletal: Positive for arthralgias.  Skin: Positive for color change.  Neurological: Negative.   Hematological: Negative.   Psychiatric/Behavioral: Negative.     Objective:  BP 118/80 (BP Location: Left Arm, Patient Position: Sitting, Cuff Size: Normal)   Pulse 80   Temp 97.6 F (  36.4 C) (Oral)   Resp 16   Ht 5\' 7"  (1.702 m)   Wt 136 lb (61.7 kg)   SpO2 99%   BMI 21.30 kg/m   BP Readings from Last 3 Encounters:  05/01/19 118/80  04/16/19 124/78  02/13/19 114/80    Wt Readings from Last 3 Encounters:  05/01/19 136 lb (61.7 kg)  04/16/19 135 lb 12.8 oz (61.6 kg)  02/13/19 132 lb 8 oz (60.1 kg)    Physical Exam Vitals reviewed.  Constitutional:      General: She is not in acute distress.    Appearance: Normal appearance. She is not ill-appearing, toxic-appearing or diaphoretic.  Musculoskeletal:     Right hand: Swelling and tenderness present.     Comments: RMF- There is a subtle area of erythema, swelling, and warmth in the paronychial region.  I do not see a discrete collection of pus.  The nail plate and nailbed appear normal.  There  is no felon.  Sensation and capillary refill is diffusely intact.  See photo.  Neurological:     Mental Status: She is alert.     Lab Results  Component Value Date   WBC 4.0 02/13/2019   HGB 13.4 02/13/2019   HCT 40.7 02/13/2019   PLT 217.0 02/13/2019   GLUCOSE 99 02/13/2019   CHOL 149 02/13/2019   TRIG 50.0 02/13/2019   HDL 60.70 02/13/2019   LDLCALC 78 02/13/2019   ALT 16 02/13/2019   AST 21 02/13/2019   NA 141 02/13/2019   K 4.7 02/13/2019   CL 104 02/13/2019   CREATININE 0.84 02/13/2019   BUN 17 02/13/2019   CO2 29 02/13/2019   TSH 2.21 02/13/2019   INR 0.98 11/20/2012    US THYROID  Result Date: 04/21/2019 CLINICAL DATA:  69 year old female with a history of thyroid nodules EXAM: THYROID ULTRASOUND TECHNIQUE: Ultrasound examination of the thyroid gland and adjacent soft tissues was performed. COMPARISON:  12/23/2013, 09/16/2013 FINDINGS: Parenchymal Echotexture: Mildly heterogenous Isthmus: 0.1 cm Right lobe: 5.3 cm x 1.2 cm x 1.3 cm Left lobe: 5.8 cm x 1.6 cm x 1.7 cm _________________________________________________________ Estimated total number of nodules >/= 1 cm: 2 Number of spongiform nodules >/=  2 cm not described below (TR1): 0 Number of mixed cystic and solid nodules >/= 1.5 cm not described below ([place]): 0 _________________________________________________________ Nodule # 1: Location: Right; Inferior Maximum size: 0.9 cm; Other 2 dimensions: 0.6 cm x 0.5 cm Composition: solid/almost completely solid (2) Echogenicity: isoechoic (1) Shape: not taller-than-wide (0) Margins: ill-defined (0) Echogenic foci: none (0) ACR TI-RADS total points: 3. ACR TI-RADS risk category: TR3 (3 points). ACR TI-RADS recommendations: Nodule does not meet criteria for surveillance or biopsy _________________________________________________________ Nodule # 2: Location: Left; Inferior Maximum size: 1.2 cm; Other 2 dimensions: 1.0 cm x 0.8 cm Composition: solid/almost completely solid (2)  Echogenicity: hypoechoic (2) Shape: not taller-than-wide (0) Margins: ill-defined (0) Echogenic foci: none (0) ACR TI-RADS total points: 4. ACR TI-RADS risk category: TR4 (4-6 points). ACR TI-RADS recommendations: Nodule meets criteria for surveillance _________________________________________________________ Nodule # 3: Location: Left; Inferior Maximum size: 2.6 cm; Other 2 dimensions: 1.8 cm x 1.4 cm. This nodule is decreased in size from prior diameter of 3.1 cm. Composition: spongiform (0) ACR TI-RADS recommendations: Spongiform nodule does not meet criteria for surveillance or biopsy _________________________________________________________ No adenopathy IMPRESSION: Multinodular thyroid. Left inferior thyroid nodule (labeled 2) meets criteria for surveillance, as designated by the newly established ACR TI-RADS criteria. Surveillance ultrasound study recommended to be  performed annually up to 5 years. Recommendations follow those established by the new ACR TI-RADS criteria (J Am Coll Radiol A9880051). Electronically Signed   By: Corrie Mckusick D.O.   On: 04/21/2019 16:09    No results found.  Assessment & Plan:   Anielle was seen today for hand pain.  Diagnoses and all orders for this visit:  Paronychia of right middle finger- This is most likely a staph infection so I recommended that she take a 7-day course of high-dose Bactrim DS.  Plain films also show concern about a foreign body in the DIP joint which may not be relevant.  Nonetheless, I recommended that she see a hand surgeon to consider having the foreign body evaluated and to see whether or not the infection needs to undergo incision and drainage. -     DG Finger Middle Right; Future -     Ambulatory referral to Orthopedic Surgery -     sulfamethoxazole-trimethoprim (BACTRIM DS) 800-160 MG tablet; Take 2 tablets by mouth 2 (two) times daily for 7 days.   I have discontinued Aubre C. Zandi "[name]"'s Omega-3 Fatty Acids (OMEGA 3  PO), zinc gluconate, ketoconazole, Famotidine (PEPCID PO), Diclofenac Sodium, montelukast, folic acid, and cholestyramine. I am also having her start on sulfamethoxazole-trimethoprim. Additionally, I am having her maintain her Magnesium, vitamin C, Potassium, b complex vitamins, Cholecalciferol (CVS VIT D 5000 HIGH-POTENCY PO), EVENING PRIMROSE OIL PO, Biotin, Theanine, valACYclovir, fluocinonide, nystatin, alendronate, amitriptyline, Restora RX, methimazole, and Potassium Gluconate.  Meds ordered this encounter  Medications  . sulfamethoxazole-trimethoprim (BACTRIM DS) 800-160 MG tablet    Sig: Take 2 tablets by mouth 2 (two) times daily for 7 days.    Dispense:  28 tablet    Refill:  0     Follow-up: Return if symptoms worsen or fail to improve.  Scarlette Calico, MD

## 2019-05-03 ENCOUNTER — Encounter (HOSPITAL_COMMUNITY): Payer: Self-pay

## 2019-05-03 ENCOUNTER — Ambulatory Visit (HOSPITAL_COMMUNITY)
Admission: EM | Admit: 2019-05-03 | Discharge: 2019-05-03 | Disposition: A | Discharge status: Home / Self Care | Payer: BLUE CROSS/BLUE SHIELD | Attending: Emergency Medicine | Admitting: Emergency Medicine

## 2019-05-03 ENCOUNTER — Other Ambulatory Visit: Payer: Self-pay

## 2019-05-03 DIAGNOSIS — L03011 Cellulitis of right finger: Secondary | ICD-10-CM | POA: Diagnosis not present

## 2019-05-03 MED ORDER — LIDOCAINE HCL (PF) 1 % IJ SOLN
INTRAMUSCULAR | Status: AC
  Filled 2019-05-03: qty 2

## 2019-05-03 MED ORDER — LIDOCAINE HCL 2 % IJ SOLN
INTRAMUSCULAR | Status: AC
  Filled 2019-05-03: qty 20

## 2019-05-03 MED ORDER — CEFTRIAXONE SODIUM 1 G IJ SOLR
INTRAMUSCULAR | Status: AC
  Filled 2019-05-03: qty 10

## 2019-05-03 MED ORDER — CLINDAMYCIN HCL 300 MG PO CAPS: 300.0000 mg | ORAL_CAPSULE | Freq: Three times a day (TID) | ORAL | 0 refills | Status: DC

## 2019-05-03 MED ORDER — CEFTRIAXONE SODIUM 1 G IJ SOLR
1.0000 g | Freq: Once | INTRAMUSCULAR | Status: AC
  Administered 2019-05-03: 17:00:00 1 g via INTRAMUSCULAR

## 2019-05-03 NOTE — ED Provider Notes (Signed)
[facility]    CSN: [id number] Arrival date & time: 05/03/19  1458      History   Chief Complaint Chief Complaint  Patient presents with  . Finger Injury    HPI Patricia Conley is a 69 y.o. female.   Patient here concerned with worsening finger infection.  Saw PCP several days ago, took 4 doses of bactrim today, notes finger infection worse.  Worsening pain, erythema, swelling, purulent area under nail worsening.       Past Medical History:  Diagnosis Date  . Acetabular labrum tear    right/ per pt history  . Arthritis   . Gall bladder disease    per pt report  . Ganglion cyst   . GERD (gastroesophageal reflux disease)   . Hemorrhoids   . Low back pain 08/20/2016  . Menopause   . Osteoporosis   . Telogen effluvium   . Thyroid disease     Patient Active Problem List   Diagnosis Date Noted  . Paronychia of right middle finger 05/01/2019  . Cervical lymphadenopathy 04/16/2019  . 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 Burning sensation of skin, scalp 05/15/2017  . 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 Anxiety 12/22/2016  . Xerostomia 10/18/2016  . Allergic rhinitis caused by mold 10/18/2016  . Ganglion cyst of finger of right hand 08/08/2016  . Peroneal tendinitis, right leg 06/12/2016  . Loss of transverse plantar arch 06/12/2016  . Mass of joint of finger 04/16/2016  . Pap smear abnormality of cervix with ASCUS favoring benign 02/08/2016  . OSA (obstructive sleep apnea) 12/28/2015  . Palpitations 08/05/2015  . Genital herpes 07/27/2015  . Abdominal pain, left upper quadrant 05/19/2015  . Osteoporosis 03/25/2015  . Dry eye 03/25/2015  . Hyperthyroidism 01/06/2015  . Hair loss 09/18/2014  . 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 Acute medial meniscal tear 09/10/2013  . Bunion of great toe 11/15/2012  . DJD (degenerative joint disease) 11/11/2012  . History of gout 11/11/2012  . GERD (gastroesophageal reflux disease) 10/27/2012  . S/P left THA, AA  09/12/2011  . Hemorrhoids, external without complications [id number]  . Osteoarthritis of hip 03/14/2011  . Renal cyst 12/15/2010  . Adrenal nodule ([place]) 12/15/2010  . Liver hemangioma 12/15/2010  . Benign breast cyst in female   . Gall bladder disease     Past Surgical History:  Procedure Laterality Date  . BREAST SURGERY  2002   cyst on Left breast  . CYSTECTOMY  1999   Right elbow  . HEMORRHOID BANDING  07/2014  . JOINT REPLACEMENT    . KNEE ARTHROSCOPY Left 09/10/2013   Procedure: LEFT MEDIAL AND LATERAL KNEE ARTHROSCOPY WITH MENISCAL DEBRIDEMENT ;  Surgeon: Gearlean Alf, MD;  Location: [facility];  Service: Orthopedics;  Laterality: Left;  . NASAL SINUS SURGERY  1995, 2002  . TOTAL HIP ARTHROPLASTY  09/12/2011   Procedure: TOTAL HIP ARTHROPLASTY ANTERIOR APPROACH;  Surgeon: Mauri Pole, MD;  Location: [facility];  Service: Orthopedics;  Laterality: Left;    OB History   No obstetric history on file.      Home Medications    Prior to Admission medications   Medication Sig Start Date End Date Taking? Authorizing Provider  alendronate (FOSAMAX) 70 MG tablet Take 1 tablet (70 mg total) by mouth once a week. Take with a full glass of water on an empty stomach. 02/13/19   Binnie Rail, MD  amitriptyline (ELAVIL) 10 MG tablet Take 1 tablet (10 mg total)  by mouth at bedtime. 02/13/19   Binnie Rail, MD  b complex vitamins capsule Take 1 capsule by mouth daily.    [provider]  Biotin 1 MG CAPS Take by mouth.    [provider]  Cholecalciferol (CVS VIT D 5000 HIGH-POTENCY PO) Take 5,000 Units by mouth daily.    [provider]  clindamycin (CLEOCIN) 300 MG capsule Take 1 capsule (300 mg total) by mouth 3 (three) times daily. 05/03/19   Peri Jefferson, PA-C  EVENING PRIMROSE OIL PO Take by mouth.    [provider]  fluocinonide (LIDEX) 0.05 % external solution  11/08/17   [provider]  Lactobacillus Casei-Folic Acid (RESTORA RX)  60-1.25 MG CAPS TAKE (1) CAPSULE DAILY. 02/13/19   Binnie Rail, MD  Magnesium 500 MG CAPS Take 500 mg by mouth daily.    [provider]  methimazole (TAPAZOLE) 5 MG tablet Take 0.5 tablets (2.5 mg total) by mouth daily. 02/13/19   Binnie Rail, MD  nystatin (MYCOSTATIN) 500000 units TABS tablet  01/13/19   [provider]  Potassium 99 MG TABS Take 99 mg by mouth daily.    [provider]  Potassium Gluconate 2.5 MEQ TABS Take by mouth.    [provider]  sulfamethoxazole-trimethoprim (BACTRIM DS) 800-160 MG tablet Take 2 tablets by mouth 2 (two) times daily for 7 days. 05/01/19 05/08/19  Janith Lima, MD  Theanine 100 MG CAPS Take 100 mg by mouth 2 (two) times daily.    [provider]  valACYclovir (VALTREX) 500 MG tablet Take 1 tablet (500 mg total) by mouth daily. 07/27/15   Binnie Rail, MD  vitamin C (ASCORBIC ACID) 500 MG tablet Take 500 mg by mouth daily.    [provider]    Family History Family History  Problem Relation Age of Onset  . Stroke Mother   . Nephrolithiasis Mother   . Diabetes Mother   . Aneurysm Mother        x2  . Cancer Mother        lymphoma, breast cancer  . Depression Father   . Parkinsonism Father   . Drug abuse Father   . Parkinson's disease Brother   . Mental illness Sister        paranoia    Social History Social History   Tobacco Use  . Smoking status: Never Smoker  . Smokeless tobacco: Never Used  Substance Use Topics  . Alcohol use: Yes    Alcohol/week: 1.0 standard drinks    Types: 1 Standard drinks or equivalent per week    Comment: once weekly or 2-3 drinks per month  . Drug use: No     Allergies   Histamine   Review of Systems Review of Systems  Constitutional: Negative for chills and fever.  Gastrointestinal: Negative for abdominal pain, diarrhea, nausea and vomiting.  Musculoskeletal: Negative for arthralgias and joint swelling.  Skin: Positive for color change.   Psychiatric/Behavioral: Negative for sleep disturbance.  All other systems reviewed and are negative.    Physical Exam Triage Vital Signs ED Triage Vitals  Enc Vitals Group     BP --      Pulse Rate 05/03/19 1527 93     Resp --      Temp 05/03/19 1527 99.6 F (37.6 C)     Temp Source 05/03/19 1527 Oral     SpO2 05/03/19 1527 99 %     Weight --  Height --      Head Circumference --      Peak Flow --      Pain Score 05/03/19 1525 4     Pain Loc --      Pain Edu? --      Excl. in [place]? --    No data found.  Updated Vital Signs Pulse 93   Temp 99.6 F (37.6 C) (Oral)   SpO2 99%   Visual Acuity Right Eye Distance:   Left Eye Distance:   Bilateral Distance:    Right Eye Near:   Left Eye Near:    Bilateral Near:     Physical Exam Vitals and nursing note reviewed.  Constitutional:      General: She is not in acute distress.    Appearance: Normal appearance. She is not toxic-appearing.  HENT:     Head: Normocephalic.  Musculoskeletal:     Right hand: Swelling and tenderness present. Normal range of motion.       Hands:  Skin:    Capillary Refill: Capillary refill takes less than 2 seconds.  Neurological:     Mental Status: She is alert.      UC Treatments / Results  Labs (all labs ordered are listed, but only abnormal results are displayed) Labs Reviewed - No data to display  EKG   Radiology No results found.  Procedures Incision and Drainage  Date/Time: 05/03/2019 4:40 PM Performed by: Peri Jefferson, PA-C Authorized by: Melynda Ripple, MD   Consent:    Consent obtained:  Verbal   Consent given by:  Patient   Risks discussed:  Bleeding, incomplete drainage, pain and infection   Alternatives discussed:  No treatment, delayed treatment and referral Location:    Type:  Abscess   Location:  Upper extremity   Upper extremity location:  Finger   Finger location:  R long finger Anesthesia (see MAR for exact dosages):    Anesthesia  method:  Nerve block   Block location:  Digital ring block   Block needle gauge:  25 G   Block anesthetic:  Lidocaine 2% w/o epi   Block outcome:  Anesthesia achieved Procedure type:    Complexity:  Simple Procedure details:    Needle aspiration: yes     Needle size:  18 G   Incision types:  Stab incision   Incision depth:  Subungual   Drainage:  Purulent   Drainage amount:  Scant   Wound treatment:  Wound left open   Packing materials:  None Post-procedure details:    Patient tolerance of procedure:  Tolerated well, no immediate complications   (including critical care time)  Medications Ordered in UC Medications  cefTRIAXone (ROCEPHIN) injection 1 g (1 g Intramuscular Given 05/03/19 1644)    Initial Impression / Assessment and Plan / UC Course  I have reviewed the triage vital signs and the nursing notes.  Pertinent labs & imaging results that were available during my care of the patient were reviewed by me and considered in my medical decision making (see chart for details).     She will follow up with hand surgery as recommended by PCP. Images reviewed, small 1 mm foreign object vs calcification noted distal middle phalanx, radial aspect of finger, unknown depth, right under infection site. Final Clinical Impressions(s) / UC Diagnoses   Final diagnoses:  Paronychia of finger, right     Discharge Instructions     Continue antibiotics Follow up with hand surgery Go to ER with  worsening symptoms.    ED Prescriptions    Medication Sig Dispense Auth. Provider   clindamycin (CLEOCIN) 300 MG capsule Take 1 capsule (300 mg total) by mouth 3 (three) times daily. 30 capsule Peri Jefferson, PA-C     PDMP not reviewed this encounter.   Peri Jefferson, PA-C 05/03/19 1717

## 2019-05-03 NOTE — ED Triage Notes (Signed)
Pt c/o infection nailbed to rt 2nd finger. Saw Dr. Ronnald Ramp on Thursday for same and Rx sulfa-TMP. Had xrays taken. Pt states infected area is getting worse with increased swelling. Exudate noted at nailbed. Denies fever, chills.

## 2019-05-03 NOTE — Discharge Instructions (Signed)
Continue antibiotics Follow up with hand surgery Go to ER with worsening symptoms.

## 2019-05-04 ENCOUNTER — Encounter: Payer: Self-pay | Admitting: Internal Medicine

## 2019-05-08 ENCOUNTER — Other Ambulatory Visit: Payer: Self-pay

## 2019-05-08 ENCOUNTER — Encounter: Payer: Self-pay | Admitting: Orthopaedic Surgery

## 2019-05-08 ENCOUNTER — Ambulatory Visit (INDEPENDENT_AMBULATORY_CARE_PROVIDER_SITE_OTHER): Payer: BLUE CROSS/BLUE SHIELD | Admitting: Orthopaedic Surgery

## 2019-05-08 DIAGNOSIS — L03011 Cellulitis of right finger: Secondary | ICD-10-CM

## 2019-05-08 NOTE — Progress Notes (Signed)
The patient is a very pleasant 69 year old right-hand-dominant female who is referred to the emergency room after they performed a procedure on her right middle finger.  She had a diagnosis of a paronychia.  This had flared up after an attempted treatment with just oral antibiotics.  She was treated with Bactrim first.  In the emergency room it looks like the put some needle holes in her nail bed to release purulence.  She was also given a dose of Rocephin and she continued her oral antibiotics.  She reports now that she is doing very well.  She said there is minimal swelling and not really much pain.  She is otherwise an active individual.  They also gave her clindamycin as oral antibiotic but she did not take it in favor of the Bactrim.  On exam her middle finger shows no redness on the right hand.  There is no pain around the fingertip or the finger in general.  Her range of motion is full.  She is neurovascularly intact.  There is some trauma to the nail itself from where they decompressed the infection.  Overall looks excellent.  I did go over her x-rays with her of her middle finger and it shows that there is nothing that I can see with the joint and looks normal as far as the bone.  There is a very very small object in the soft tissue that may be a foreign body but it is not worrisome to her.  A long thorough discussion about her finger.  All question concerns were answered addressed.  At this point she looking so good that I would just have her follow-up as needed.  She can also stop antibiotics.  She does understand though that if things flareup significantly that she needs to call and come see [place] right away.

## 2019-05-14 ENCOUNTER — Other Ambulatory Visit: Payer: BLUE CROSS/BLUE SHIELD

## 2019-05-21 ENCOUNTER — Other Ambulatory Visit: Payer: Self-pay

## 2019-05-21 ENCOUNTER — Ambulatory Visit
Admission: RE | Admit: 2019-05-21 | Discharge: 2019-05-21 | Disposition: A | Discharge status: Home / Self Care | Payer: BLUE CROSS/BLUE SHIELD | Source: Ambulatory Visit | Attending: Otolaryngology | Admitting: Otolaryngology

## 2019-05-21 DIAGNOSIS — R070 Pain in throat: Secondary | ICD-10-CM

## 2019-05-21 MED ORDER — IOPAMIDOL (ISOVUE-300) INJECTION 61%
75.0000 mL | Freq: Once | INTRAVENOUS | Status: AC | PRN
  Administered 2019-05-21: 75 mL via INTRAVENOUS

## 2019-07-24 DIAGNOSIS — Z860100 Personal history of colon polyps, unspecified: Secondary | ICD-10-CM

## 2019-07-24 HISTORY — DX: Personal history of colon polyps, unspecified: Z86.0100

## 2019-07-24 HISTORY — DX: Personal history of colonic polyps: Z86.010

## 2019-10-20 LAB — HM DEXA SCAN

## 2019-10-30 ENCOUNTER — Encounter: Payer: Self-pay | Admitting: Internal Medicine

## 2019-11-12 ENCOUNTER — Encounter: Payer: Self-pay | Admitting: Internal Medicine

## 2019-11-12 NOTE — Progress Notes (Signed)
Outside notes received. Information abstracted. Notes sent to scan.  

## 2020-01-16 ENCOUNTER — Other Ambulatory Visit: Payer: Self-pay | Admitting: Internal Medicine

## 2020-02-12 ENCOUNTER — Other Ambulatory Visit: Payer: Self-pay | Admitting: Internal Medicine

## 2020-02-13 NOTE — Telephone Encounter (Signed)
Pt has her CPX on Monday 10/25. Will hold to refill at the appt.Marland KitchenJohny Chess

## 2020-02-15 NOTE — Patient Instructions (Addendum)
Blood work was ordered.    All other Health Maintenance issues reviewed.   All recommended immunizations and age-appropriate screenings are up-to-date or discussed.  Pneumovax immunization administered today.   Medications reviewed and updated.  Changes include :   none  Your prescription(s) have been submitted to your pharmacy. Please take as directed and contact our office if you believe you are having problem(s) with the medication(s).   Please followup in 1 year    Health Maintenance, Female Adopting a healthy lifestyle and getting preventive care are important in promoting health and wellness. Ask your health care provider about:  The right schedule for you to have regular tests and exams.  Things you can do on your own to prevent diseases and keep yourself healthy. What should I know about diet, weight, and exercise? Eat a healthy diet   Eat a diet that includes plenty of vegetables, fruits, low-fat dairy products, and lean protein.  Do not eat a lot of foods that are high in solid fats, added sugars, or sodium. Maintain a healthy weight Body mass index (BMI) is used to identify weight problems. It estimates body fat based on height and weight. Your health care provider can help determine your BMI and help you achieve or maintain a healthy weight. Get regular exercise Get regular exercise. This is one of the most important things you can do for your health. Most adults should:  Exercise for at least 150 minutes each week. The exercise should increase your heart rate and make you sweat (moderate-intensity exercise).  Do strengthening exercises at least twice a week. This is in addition to the moderate-intensity exercise.  Spend less time sitting. Even light physical activity can be beneficial. Watch cholesterol and blood lipids Have your blood tested for lipids and cholesterol at 67 years of age, then have this test every 5 years. Have your cholesterol levels checked  more often if:  Your lipid or cholesterol levels are high.  You are older than 67 years of age.  You are at high risk for heart disease. What should I know about cancer screening? Depending on your health history and family history, you may need to have cancer screening at various ages. This may include screening for:  Breast cancer.  Cervical cancer.  Colorectal cancer.  Skin cancer.  Lung cancer. What should I know about heart disease, diabetes, and high blood pressure? Blood pressure and heart disease  High blood pressure causes heart disease and increases the risk of stroke. This is more likely to develop in people who have high blood pressure readings, are of African descent, or are overweight.  Have your blood pressure checked: ? Every 3-5 years if you are 63-80 years of age. ? Every year if you are 12 years old or older. Diabetes Have regular diabetes screenings. This checks your fasting blood sugar level. Have the screening done:  Once every three years after age 55 if you are at a normal weight and have a low risk for diabetes.  More often and at a younger age if you are overweight or have a high risk for diabetes. What should I know about preventing infection? Hepatitis B If you have a higher risk for hepatitis B, you should be screened for this virus. Talk with your health care provider to find out if you are at risk for hepatitis B infection. Hepatitis C Testing is recommended for:  Everyone born from [birth year] through 1963.  Anyone with known risk factors for hepatitis C.  Sexually transmitted infections (STIs)  Get screened for STIs, including gonorrhea and chlamydia, if: ? You are sexually active and are younger than 67 years of age. ? You are older than 67 years of age and your health care provider tells you that you are at risk for this type of infection. ? Your sexual activity has changed since you were last screened, and you are at increased risk for  chlamydia or gonorrhea. Ask your health care provider if you are at risk.  Ask your health care provider about whether you are at high risk for HIV. Your health care provider may recommend a prescription medicine to help prevent HIV infection. If you choose to take medicine to prevent HIV, you should first get tested for HIV. You should then be tested every 3 months for as long as you are taking the medicine. Pregnancy  If you are about to stop having your period (premenopausal) and you may become pregnant, seek counseling before you get pregnant.  Take 400 to 800 micrograms (mcg) of folic acid every day if you become pregnant.  Ask for birth control (contraception) if you want to prevent pregnancy. Osteoporosis and menopause Osteoporosis is a disease in which the bones lose minerals and strength with aging. This can result in bone fractures. If you are 18 years old or older, or if you are at risk for osteoporosis and fractures, ask your health care provider if you should:  Be screened for bone loss.  Take a calcium or vitamin D supplement to lower your risk of fractures.  Be given hormone replacement therapy (HRT) to treat symptoms of menopause. Follow these instructions at home: Lifestyle  Do not use any products that contain nicotine or tobacco, such as cigarettes, e-cigarettes, and chewing tobacco. If you need help quitting, ask your health care provider.  Do not use street drugs.  Do not share needles.  Ask your health care provider for help if you need support or information about quitting drugs. Alcohol use  Do not drink alcohol if: ? Your health care provider tells you not to drink. ? You are pregnant, may be pregnant, or are planning to become pregnant.  If you drink alcohol: ? Limit how much you use to 0-1 drink a day. ? Limit intake if you are breastfeeding.  Be aware of how much alcohol is in your drink. In the [place] one drink equals one 12 oz bottle of beer (355  mL), one 5 oz glass of wine (148 mL), or one 1 oz glass of hard liquor (44 mL). General instructions  Schedule regular health, dental, and eye exams.  Stay current with your vaccines.  Tell your health care provider if: ? You often feel depressed. ? You have ever been abused or do not feel safe at home. Summary  Adopting a healthy lifestyle and getting preventive care are important in promoting health and wellness.  Follow your health care provider's instructions about healthy diet, exercising, and getting tested or screened for diseases.  Follow your health care provider's instructions on monitoring your cholesterol and blood pressure. This information is not intended to replace advice given to you by your health care provider. Make sure you discuss any questions you have with your health care provider. Document Revised: 04/03/2018 Document Reviewed: 04/03/2018 Elsevier Patient Education  2020 [employer].

## 2020-02-15 NOTE — Progress Notes (Signed)
Subjective:    Patient ID: Patricia Conley, female    DOB: 1950-08-29, 69 y.o.   MRN: [id number]   This visit occurred during the SARS-CoV-2 public health emergency.  Safety protocols were in place, including screening questions prior to the visit, additional usage of staff PPE, and extensive cleaning of exam room while observing appropriate contact time as indicated for disinfecting solutions.    HPI She is here for a physical exam.   She still has hair loss - it has gotten worse.  She has seen derm.    She still has the flushing - her dermatologist discussed possible clonidine.    She has sensitivity to many things and gets numbness at times - her allergist can not help her with this.  She was interested in seeing a neurologist.   Medications and allergies reviewed with patient and updated if appropriate.  Patient Active Problem List   Diagnosis Date Noted  . Paronychia of right middle finger 05/01/2019  . Cervical lymphadenopathy 04/16/2019  . 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 Burning sensation of skin, scalp 05/15/2017  . 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 Xerostomia 10/18/2016  . Allergic rhinitis caused by mold 10/18/2016  . Ganglion cyst of finger of right hand 08/08/2016  . Loss of transverse plantar arch 06/12/2016  . Mass of joint of finger 04/16/2016  . Pap smear abnormality of cervix with ASCUS favoring benign 02/08/2016  . OSA (obstructive sleep apnea) 12/28/2015  . Palpitations 08/05/2015  . Genital herpes 07/27/2015  . Abdominal pain, left upper quadrant 05/19/2015  . Osteoporosis 03/25/2015  . Dry eye 03/25/2015  . Hyperthyroidism 01/06/2015  . Hair loss 09/18/2014  . 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 Acute medial meniscal tear 09/10/2013  . Bunion of great toe 11/15/2012  . DJD (degenerative joint disease) 11/11/2012  . History of gout 11/11/2012  . S/P left THA, AA 09/12/2011  . Hemorrhoids, external without complications 09/62/8366  .  Osteoarthritis of hip 03/14/2011  . Renal cyst 12/15/2010  . Adrenal nodule ([place]) 12/15/2010  . Liver hemangioma 12/15/2010  . Gall bladder disease     Current Outpatient Medications on File Prior to Visit  Medication Sig Dispense Refill  . b complex vitamins capsule Take 1 capsule by mouth daily.    . Biotin 1 MG CAPS Take by mouth.    . Cholecalciferol ([employer] VIT D 5000 HIGH-POTENCY PO) Take 5,000 Units by mouth daily.    . clindamycin (CLEOCIN) 300 MG capsule Take 1 capsule (300 mg total) by mouth 3 (three) times daily. 30 capsule 0  . EVENING PRIMROSE OIL PO Take by mouth.    . fluocinonide (LIDEX) 0.05 % external solution   6  . Magnesium 500 MG CAPS Take 500 mg by mouth daily.    Marland Kitchen nystatin (MYCOSTATIN) 500000 units TABS tablet     . Potassium 99 MG TABS Take 99 mg by mouth daily.    . Potassium Gluconate 2.5 MEQ TABS Take by mouth.    . Theanine 100 MG CAPS Take 100 mg by mouth 2 (two) times daily.    . vitamin C (ASCORBIC ACID) 500 MG tablet Take 500 mg by mouth daily.     No current facility-administered medications on file prior to visit.    Past Medical History:  Diagnosis Date  . Acetabular labrum tear    right/ per pt history  . Arthritis   . Gall bladder disease    per pt report  . Ganglion  cyst   . GERD (gastroesophageal reflux disease)   . Hemorrhoids   . Low back pain 08/20/2016  . Menopause   . Osteoporosis   . Telogen effluvium   . Thyroid disease     Past Surgical History:  Procedure Laterality Date  . BREAST SURGERY  2002   cyst on Left breast  . CYSTECTOMY  1999   Right elbow  . HEMORRHOID BANDING  07/2014  . JOINT REPLACEMENT    . KNEE ARTHROSCOPY Left 09/10/2013   Procedure: LEFT MEDIAL AND LATERAL KNEE ARTHROSCOPY WITH MENISCAL DEBRIDEMENT ;  Surgeon: Gearlean Alf, MD;  Location: [facility];  Service: Orthopedics;  Laterality: Left;  . NASAL SINUS SURGERY  1995, 2002  . TOTAL HIP ARTHROPLASTY  09/12/2011   Procedure: TOTAL HIP ARTHROPLASTY  ANTERIOR APPROACH;  Surgeon: Mauri Pole, MD;  Location: [facility];  Service: Orthopedics;  Laterality: Left;    Social History   Socioeconomic History  . Marital status: Divorced    Spouse name: Not on file  . Number of children: Not on file  . Years of education: Not on file  . Highest education level: Not on file  Occupational History  . Occupation: Optometrist  Tobacco Use  . Smoking status: Never Smoker  . Smokeless tobacco: Never Used  Vaping Use  . Vaping Use: Never used  Substance and Sexual Activity  . Alcohol use: Yes    Alcohol/week: 1.0 standard drink    Types: 1 Standard drinks or equivalent per week    Comment: once weekly or 2-3 drinks per month  . Drug use: No  . Sexual activity: Not Currently  Other Topics Concern  . Not on file  Social History Narrative  . Not on file   Social Determinants of Health   Financial Resource Strain:   . Difficulty of Paying Living Expenses: Not on file  Food Insecurity:   . Worried About Charity fundraiser in the Last Year: Not on file  . Ran Out of Food in the Last Year: Not on file  Transportation Needs:   . Lack of Transportation (Medical): Not on file  . Lack of Transportation (Non-Medical): Not on file  Physical Activity:   . Days of Exercise per Week: Not on file  . Minutes of Exercise per Session: Not on file  Stress:   . Feeling of Stress : Not on file  Social Connections:   . Frequency of Communication with Friends and Family: Not on file  . Frequency of Social Gatherings with Friends and Family: Not on file  . Attends Religious Services: Not on file  . Active Member of Clubs or Organizations: Not on file  . Attends Archivist Meetings: Not on file  . Marital Status: Not on file    Family History  Problem Relation Age of Onset  . Stroke Mother   . Nephrolithiasis Mother   . Diabetes Mother   . Aneurysm Mother        x2  . Cancer Mother        lymphoma, breast cancer  . Depression Father     . Parkinsonism Father   . Drug abuse Father   . Parkinson's disease Brother   . Mental illness Sister        paranoia    Review of Systems  Constitutional: Negative for chills and fever.  Eyes: Negative for visual disturbance.  Respiratory: Negative for cough, shortness of breath and wheezing.   Cardiovascular: Positive for  palpitations (rare). Negative for chest pain and leg swelling.  Gastrointestinal: Positive for nausea (occ - certain foods). Negative for abdominal pain, blood in stool, constipation and diarrhea.       No gerd  Genitourinary: Negative for dysuria and hematuria.  Musculoskeletal: Positive for arthralgias (mild). Negative for back pain.  Skin: Negative for rash.  Neurological: Positive for headaches (sinus headaches with allergies). Negative for light-headedness.  Psychiatric/Behavioral: Positive for dysphoric mood (mild). The patient is not nervous/anxious.        Objective:   Vitals:   02/16/20 0814  BP: 110/80  Pulse: 85  Temp: 98.5 F (36.9 C)  SpO2: 95%   Filed Weights   02/16/20 0814  Weight: 138 lb (62.6 kg)   Body mass index is 21.61 kg/m.  BP Readings from Last 3 Encounters:  02/16/20 110/80  05/01/19 118/80  04/16/19 124/78    Wt Readings from Last 3 Encounters:  02/16/20 138 lb (62.6 kg)  05/01/19 136 lb (61.7 kg)  04/16/19 135 lb 12.8 oz (61.6 kg)     Physical Exam Constitutional: She appears well-developed and well-nourished. No distress.  HENT:  Head: Normocephalic and atraumatic.  Right Ear: External ear normal. Normal ear canal and TM Left Ear: External ear normal.  Normal ear canal and TM Mouth/Throat: Oropharynx is clear and moist.  Eyes: Conjunctivae and EOM are normal.  Neck: Neck supple. No tracheal deviation present. No thyromegaly present.  No carotid bruit  Cardiovascular: Normal rate, regular rhythm and normal heart sounds.   No murmur heard.  No edema. Pulmonary/Chest: Effort normal and breath sounds normal.  No respiratory distress. She has no wheezes. She has no rales.  Breast: deferred   Abdominal: Soft. She exhibits no distension. There is no tenderness.  Lymphadenopathy: She has no cervical adenopathy.  Skin: Skin is warm and dry. She is not diaphoretic.  Psychiatric: She has a normal mood and affect. Her behavior is normal.        Assessment & Plan:   Physical exam: Screening blood work    ordered Immunizations  Flu vac done already, PPSV23 today , discussed shingrix and covid booster Colonoscopy  Up to date  Mammogram  Up to date  Gyn  Up to date Dexa  Up to date  Eye exams  Up to date  Exercise  Walks a little Weight  normal Substance abuse  none      See Problem List for Assessment and Plan of chronic medical problems.

## 2020-02-16 ENCOUNTER — Ambulatory Visit (INDEPENDENT_AMBULATORY_CARE_PROVIDER_SITE_OTHER): Payer: BLUE CROSS/BLUE SHIELD | Admitting: Internal Medicine

## 2020-02-16 ENCOUNTER — Encounter: Payer: Self-pay | Admitting: Internal Medicine

## 2020-02-16 ENCOUNTER — Other Ambulatory Visit: Payer: Self-pay

## 2020-02-16 VITALS — BP 110/80 | HR 85 | Temp 98.5°F | Ht 67.0 in | Wt 138.0 lb

## 2020-02-16 DIAGNOSIS — Z0000 Encounter for general adult medical examination without abnormal findings: Secondary | ICD-10-CM

## 2020-02-16 DIAGNOSIS — Z23 Encounter for immunization: Secondary | ICD-10-CM

## 2020-02-16 DIAGNOSIS — R739 Hyperglycemia, unspecified: Secondary | ICD-10-CM | POA: Diagnosis not present

## 2020-02-16 DIAGNOSIS — R208 Other disturbances of skin sensation: Secondary | ICD-10-CM

## 2020-02-16 DIAGNOSIS — M810 Age-related osteoporosis without current pathological fracture: Secondary | ICD-10-CM | POA: Diagnosis not present

## 2020-02-16 DIAGNOSIS — E0590 Thyrotoxicosis, unspecified without thyrotoxic crisis or storm: Secondary | ICD-10-CM | POA: Diagnosis not present

## 2020-02-16 DIAGNOSIS — A6004 Herpesviral vulvovaginitis: Secondary | ICD-10-CM

## 2020-02-16 LAB — LIPID PANEL
Cholesterol: 156 mg/dL (ref 0–200)
HDL: 59.9 mg/dL (ref >39.00)
LDL Cholesterol: 86 mg/dL (ref 0–99)
NonHDL: 96.26
Total CHOL/HDL Ratio: 3
Triglycerides: 53 mg/dL (ref 0.0–149.0)
VLDL: 10.6 mg/dL (ref 0.0–40.0)

## 2020-02-16 LAB — COMPREHENSIVE METABOLIC PANEL
ALT: 17 U/L (ref 0–35)
AST: 24 U/L (ref 0–37)
Albumin: 4.7 g/dL (ref 3.5–5.2)
Alkaline Phosphatase: 45 U/L (ref 39–117)
BUN: 16 mg/dL (ref 6–23)
CO2: 28 mEq/L (ref 19–32)
Calcium: 9.6 mg/dL (ref 8.4–10.5)
Chloride: 105 mEq/L (ref 96–112)
Creatinine, Ser: 0.89 mg/dL (ref 0.40–1.20)
GFR: 67.29 mL/min (ref >60.00)
Glucose, Bld: 89 mg/dL (ref 70–99)
Potassium: 5.1 mEq/L (ref 3.5–5.1)
Sodium: 141 mEq/L (ref 135–145)
Total Bilirubin: 0.9 mg/dL (ref 0.2–1.2)
Total Protein: 6.7 g/dL (ref 6.0–8.3)

## 2020-02-16 LAB — CBC WITH DIFFERENTIAL/PLATELET
Basophils Absolute: 0 10*3/uL (ref 0.0–0.1)
Basophils Relative: 1.1 % (ref 0.0–3.0)
Eosinophils Absolute: 0.1 10*3/uL (ref 0.0–0.7)
Eosinophils Relative: 1.6 % (ref 0.0–5.0)
HCT: 40.2 % (ref 36.0–46.0)
Hemoglobin: 13.6 g/dL (ref 12.0–15.0)
Lymphocytes Relative: 28.3 % (ref 12.0–46.0)
Lymphs Abs: 1.2 10*3/uL (ref 0.7–4.0)
MCHC: 33.7 g/dL (ref 30.0–36.0)
MCV: 88 fl (ref 78.0–100.0)
Monocytes Absolute: 0.4 10*3/uL (ref 0.1–1.0)
Monocytes Relative: 8.6 % (ref 3.0–12.0)
Neutro Abs: 2.6 10*3/uL (ref 1.4–7.7)
Neutrophils Relative %: 60.4 % (ref 43.0–77.0)
Platelets: 233 10*3/uL (ref 150.0–400.0)
RBC: 4.57 Mil/uL (ref 3.87–5.11)
RDW: 13.6 % (ref 11.5–15.5)
WBC: 4.2 10*3/uL (ref 4.0–10.5)

## 2020-02-16 LAB — T4, FREE: Free T4: 0.8 ng/dL (ref 0.60–1.60)

## 2020-02-16 LAB — HEMOGLOBIN A1C: Hgb A1c MFr Bld: 5.9 % (ref 4.6–6.5)

## 2020-02-16 LAB — T3, FREE: T3, Free: 3.2 pg/mL (ref 2.3–4.2)

## 2020-02-16 LAB — VITAMIN D 25 HYDROXY (VIT D DEFICIENCY, FRACTURES): VITD: 39.78 ng/mL (ref 30.00–100.00)

## 2020-02-16 LAB — TSH: TSH: 1.79 u[IU]/mL (ref 0.35–4.50)

## 2020-02-16 MED ORDER — RESTORA RX 60-1.25 MG PO CAPS: ORAL_CAPSULE | ORAL | 3 refills | Status: DC

## 2020-02-16 MED ORDER — METHIMAZOLE 5 MG PO TABS: 2.5000 mg | ORAL_TABLET | Freq: Every day | ORAL | 3 refills | Status: DC

## 2020-02-16 MED ORDER — ALENDRONATE SODIUM 70 MG PO TABS: ORAL_TABLET | ORAL | 2 refills | Status: DC

## 2020-02-16 MED ORDER — VALACYCLOVIR HCL 500 MG PO TABS: 500.0000 mg | ORAL_TABLET | Freq: Every day | ORAL | 1 refills | Status: DC

## 2020-02-16 MED ORDER — AMITRIPTYLINE HCL 10 MG PO TABS: 10.0000 mg | ORAL_TABLET | Freq: Every day | ORAL | 3 refills | Status: DC

## 2020-02-16 NOTE — Assessment & Plan Note (Signed)
Chronic  Clinically euthyroid Currently taking methimazole 2.5 mg daily Check tsh  Titrate med dose if needed

## 2020-02-16 NOTE — Assessment & Plan Note (Signed)
Chronic Rare breakouts - has had a few more breakouts since covid vaccine Taking valtrex prn, but given recent outbreaks will start valtrex 500 mg daily x 6 months then can go back to as needed

## 2020-02-16 NOTE — Assessment & Plan Note (Addendum)
Chronic Taking amitriptyline 10-20 mg at night, which helps - continue

## 2020-02-16 NOTE — Assessment & Plan Note (Signed)
Chronic dexa up to date Taking fosamax - started in 2017 - will end next summer Taking calcium and vitamin d Walking for exercise Check vitamin d level

## 2020-02-17 ENCOUNTER — Encounter: Payer: Self-pay | Admitting: Internal Medicine

## 2020-03-11 LAB — HM MAMMOGRAPHY

## 2020-04-01 ENCOUNTER — Encounter: Payer: Self-pay | Admitting: Internal Medicine

## 2020-04-01 NOTE — Progress Notes (Signed)
Outside notes received. Information abstracted. Notes sent to scan.  

## 2020-08-26 NOTE — Progress Notes (Deleted)
[facility] [place] Phone: [phone] Subjective:    I'm seeing this patient by the request  of:  Burns, Claudina Lick, MD  CC:   [id number]  Patricia Conley is a 70 y.o. female coming in with complaint of ganglion cysts. Patient states   Onset-  Location Duration-  Character- Aggravating factors- Reliving factors-  Therapies tried-  Severity-     Past Medical History:  Diagnosis Date  . Acetabular labrum tear    right/ per pt history  . Arthritis   . Gall bladder disease    per pt report  . Ganglion cyst   . GERD (gastroesophageal reflux disease)   . Hemorrhoids   . Low back pain 08/20/2016  . Menopause   . Osteoporosis   . Telogen effluvium   . Thyroid disease    Past Surgical History:  Procedure Laterality Date  . BREAST SURGERY  2002   cyst on Left breast  . CYSTECTOMY  1999   Right elbow  . HEMORRHOID BANDING  07/2014  . JOINT REPLACEMENT    . KNEE ARTHROSCOPY Left 09/10/2013   Procedure: LEFT MEDIAL AND LATERAL KNEE ARTHROSCOPY WITH MENISCAL DEBRIDEMENT ;  Surgeon: Gearlean Alf, MD;  Location: [facility];  Service: Orthopedics;  Laterality: Left;  . NASAL SINUS SURGERY  1995, 2002  . TOTAL HIP ARTHROPLASTY  09/12/2011   Procedure: TOTAL HIP ARTHROPLASTY ANTERIOR APPROACH;  Surgeon: Mauri Pole, MD;  Location: [facility];  Service: Orthopedics;  Laterality: Left;   Social History   Socioeconomic History  . Marital status: Divorced    Spouse name: Not on file  . Number of children: Not on file  . Years of education: Not on file  . Highest education level: Not on file  Occupational History  . Occupation: Optometrist  Tobacco Use  . Smoking status: Never Smoker  . Smokeless tobacco: Never Used  Vaping Use  . Vaping Use: Never used  Substance and Sexual Activity  . Alcohol use: Yes    Alcohol/week: 1.0 standard drink    Types: 1 Standard drinks or equivalent per week    Comment: once  weekly or 2-3 drinks per month  . Drug use: No  . Sexual activity: Not Currently  Other Topics Concern  . Not on file  Social History Narrative  . Not on file   Social Determinants of Health   Financial Resource Strain: Not on file  Food Insecurity: Not on file  Transportation Needs: Not on file  Physical Activity: Not on file  Stress: Not on file  Social Connections: Not on file   Allergies  Allergen Reactions  . Histamine Rash    Flushing   Family History  Problem Relation Age of Onset  . Stroke Mother   . Nephrolithiasis Mother   . Diabetes Mother   . Aneurysm Mother        x2  . Cancer Mother        lymphoma, breast cancer  . Depression Father   . Parkinsonism Father   . Drug abuse Father   . Parkinson's disease Brother   . Mental illness Sister        paranoia    Current Outpatient Medications (Endocrine & Metabolic):  .  alendronate (FOSAMAX) 70 MG tablet, TAKE 1 TABLET BY MOUTH ONCE WEEKLY. TAKE WITH A FULL GLASS OF WATER ON AN EMPTY STOMACH. .  methimazole (TAPAZOLE) 5 MG tablet, Take 0.5 tablets (2.5  mg total) by mouth daily.      Current Outpatient Medications (Other):  .  amitriptyline (ELAVIL) 10 MG tablet, Take 1 tablet (10 mg total) by mouth at bedtime. Marland Kitchen  b complex vitamins capsule, Take 1 capsule by mouth daily. .  Biotin 1 MG CAPS, Take by mouth. .  Cholecalciferol (CVS VIT D 5000 HIGH-POTENCY PO), Take 5,000 Units by mouth daily. .  clindamycin (CLEOCIN) 300 MG capsule, Take 1 capsule (300 mg total) by mouth 3 (three) times daily. Marland Kitchen  EVENING PRIMROSE OIL PO, Take by mouth. .  fluocinonide (LIDEX) 0.05 % external solution,  .  Lactobacillus Casei-Folic Acid (RESTORA RX) 60-1.25 MG CAPS, TAKE (1) CAPSULE DAILY. .  Magnesium 500 MG CAPS, Take 500 mg by mouth daily. Marland Kitchen  nystatin (MYCOSTATIN) 500000 units TABS tablet,  .  Potassium 99 MG TABS, Take 99 mg by mouth daily. .  Potassium Gluconate 2.5 MEQ TABS, Take by mouth. .  Theanine 100 MG CAPS,  Take 100 mg by mouth 2 (two) times daily. .  valACYclovir (VALTREX) 500 MG tablet, Take 1 tablet (500 mg total) by mouth daily. .  vitamin C (ASCORBIC ACID) 500 MG tablet, Take 500 mg by mouth daily.   Reviewed prior external information including notes and imaging from  primary care provider As well as notes that were available from care everywhere and other healthcare systems.  Past medical history, social, surgical and family history all reviewed in electronic medical record.  No pertanent information unless stated regarding to the chief complaint.   Review of Systems:  No headache, visual changes, nausea, vomiting, diarrhea, constipation, dizziness, abdominal pain, skin rash, fevers, chills, night sweats, weight loss, swollen lymph nodes, body aches, joint swelling, chest pain, shortness of breath, mood changes. POSITIVE muscle aches  Objective  There were no vitals taken for this visit.   General: No apparent distress alert and oriented x3 mood and affect normal, dressed appropriately.  HEENT: Pupils equal, extraocular movements intact  Respiratory: Patient's speak in full sentences and does not appear [place] of breath  Cardiovascular: No lower extremity edema, non tender, no erythema  Gait normal with good balance and coordination.  MSK:  Non tender with full range of motion and good stability and symmetric strength and tone of shoulders, elbows, wrist, hip, knee and ankles bilaterally.     Impression and Recommendations:     The above documentation has been reviewed and is accurate and complete Patricia Conley

## 2020-08-27 ENCOUNTER — Ambulatory Visit: Payer: BLUE CROSS/BLUE SHIELD | Admitting: Family Medicine

## 2020-10-04 DIAGNOSIS — R5383 Other fatigue: Secondary | ICD-10-CM | POA: Insufficient documentation

## 2020-10-04 NOTE — Progress Notes (Deleted)
[place] Phone: [phone] Subjective:    I'm seeing this patient by the request  of:  Burns, Claudina Lick, MD  CC:   [id number]  Patricia Conley is a 70 y.o. female coming in with complaint of ganglion cysts. Last seen for foot pain in 2019. Patient states   Onset-  Location Duration-  Character- Aggravating factors- Reliving factors-  Therapies tried-  Severity-     Past Medical History:  Diagnosis Date   Acetabular labrum tear    right/ per pt history   Arthritis    Gall bladder disease    per pt report   Ganglion cyst    GERD (gastroesophageal reflux disease)    Hemorrhoids    Low back pain 08/20/2016   Menopause    Osteoporosis    Telogen effluvium    Thyroid disease    Past Surgical History:  Procedure Laterality Date   BREAST SURGERY  2002   cyst on Left breast   CYSTECTOMY  1999   Right elbow   HEMORRHOID BANDING  07/2014   JOINT REPLACEMENT     KNEE ARTHROSCOPY Left 09/10/2013   Procedure: LEFT MEDIAL AND LATERAL KNEE ARTHROSCOPY WITH MENISCAL DEBRIDEMENT ;  Surgeon: Gearlean Alf, MD;  Location: [facility];  Service: Orthopedics;  Laterality: Left;   NASAL SINUS SURGERY  1995, 2002   TOTAL HIP ARTHROPLASTY  09/12/2011   Procedure: TOTAL HIP ARTHROPLASTY ANTERIOR APPROACH;  Surgeon: Mauri Pole, MD;  Location: [facility];  Service: Orthopedics;  Laterality: Left;   Social History   Socioeconomic History   Marital status: Divorced    Spouse name: Not on file   Number of children: Not on file   Years of education: Not on file   Highest education level: Not on file  Occupational History   Occupation: Accountant  Tobacco Use   Smoking status: Never   Smokeless tobacco: Never  Vaping Use   Vaping Use: Never used  Substance and Sexual Activity   Alcohol use: Yes    Alcohol/week: 1.0 standard drink    Types: 1 Standard drinks or equivalent per week    Comment: once weekly  or 2-3 drinks per month   Drug use: No   Sexual activity: Not Currently  Other Topics Concern   Not on file  Social History Narrative   Not on file   Social Determinants of Health   Financial Resource Strain: Not on file  Food Insecurity: Not on file  Transportation Needs: Not on file  Physical Activity: Not on file  Stress: Not on file  Social Connections: Not on file   Allergies  Allergen Reactions   Histamine Rash    Flushing   Family History  Problem Relation Age of Onset   Stroke Mother    Nephrolithiasis Mother    Diabetes Mother    Aneurysm Mother        x2   Cancer Mother        lymphoma, breast cancer   Depression Father    Parkinsonism Father    Drug abuse Father    Parkinson's disease Brother    Mental illness Sister        paranoia    Current Outpatient Medications (Endocrine & Metabolic):    alendronate (FOSAMAX) 70 MG tablet, TAKE 1 TABLET BY MOUTH ONCE WEEKLY. TAKE WITH A FULL GLASS OF WATER ON AN EMPTY STOMACH.   methimazole (TAPAZOLE) 5 MG  tablet, Take 0.5 tablets (2.5 mg total) by mouth daily.      Current Outpatient Medications (Other):    amitriptyline (ELAVIL) 10 MG tablet, Take 1 tablet (10 mg total) by mouth at bedtime.   b complex vitamins capsule, Take 1 capsule by mouth daily.   Biotin 1 MG CAPS, Take by mouth.   Cholecalciferol ([employer]S VIT D 5000 HIGH-POTENCY PO), Take 5,000 Units by mouth daily.   clindamycin (CLEOCIN) 300 MG capsule, Take 1 capsule (300 mg total) by mouth 3 (three) times daily.   EVENING PRIMROSE OIL PO, Take by mouth.   fluocinonide (LIDEX) 0.05 % external solution,    Lactobacillus Casei-Folic Acid (RESTORA RX) 60-1.25 MG CAPS, TAKE (1) CAPSULE DAILY.   Magnesium 500 MG CAPS, Take 500 mg by mouth daily.   nystatin (MYCOSTATIN) 500000 units TABS tablet,    Potassium 99 MG TABS, Take 99 mg by mouth daily.   Potassium Gluconate 2.5 MEQ TABS, Take by mouth.   Theanine 100 MG CAPS, Take 100 mg by mouth 2 (two) times  daily.   valACYclovir (VALTREX) 500 MG tablet, Take 1 tablet (500 mg total) by mouth daily.   vitamin C (ASCORBIC ACID) 500 MG tablet, Take 500 mg by mouth daily.   Reviewed prior external information including notes and imaging from  primary care provider As well as notes that were available from care everywhere and other healthcare systems.  Past medical history, social, surgical and family history all reviewed in electronic medical record.  No pertanent information unless stated regarding to the chief complaint.   Review of Systems:  No headache, visual changes, nausea, vomiting, diarrhea, constipation, dizziness, abdominal pain, skin rash, fevers, chills, night sweats, weight loss, swollen lymph nodes, body aches, joint swelling, chest pain, shortness of breath, mood changes. POSITIVE muscle aches  Objective  There were no vitals taken for this visit.   General: No apparent distress alert and oriented x3 mood and affect normal, dressed appropriately.  HEENT: Pupils equal, extraocular movements intact  Respiratory: Patient's speak in full sentences and does not appear [place] of breath  Cardiovascular: No lower extremity edema, non tender, no erythema  Gait normal with good balance and coordination.  MSK:  Non tender with full range of motion and good stability and symmetric strength and tone of shoulders, elbows, wrist, hip, knee and ankles bilaterally.     Impression and Recommendations:     The above documentation has been reviewed and is accurate and complete Jacqualin Combes

## 2020-10-04 NOTE — Progress Notes (Signed)
Subjective:    Patient ID: Patricia Conley, female    DOB: 01/04/1951, 70 y.o.   MRN: [id number]  HPI The patient is here for an acute visit.   Fatigue - worse in last 10 days.  She is prone to have fatigue lasting 2-4 days associated with high HR and flu like symptoms.  It passes.  These episodes have been going on for over 15 years.  She was at the beach about 10 days ago and felt run down.  She got home and it got worse.  Monday or Tuesday of last week she felt like she had the flu.        She had fatigue.  She felt like she had inflammation in her skull.  Her LNs were enlarged.  They were tender.  She has had lower back pain x 5-6 days.    She feels somewhat better today.  She walked up the stairs today and her HR was pounding and she was fatigued out of proportion.      Medications and allergies reviewed with patient and updated if appropriate.  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   Burning sensation of skin, scalp 05/15/2017   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   Xerostomia 10/18/2016   Allergic rhinitis caused by mold 10/18/2016   Ganglion cyst of finger of right hand 08/08/2016   Loss of transverse plantar arch 06/12/2016   Mass of joint of finger 04/16/2016   Pap smear abnormality of cervix with ASCUS favoring benign 02/08/2016   OSA (obstructive sleep apnea) 12/28/2015   Palpitations 08/05/2015   Genital herpes 07/27/2015   Abdominal pain, left upper quadrant 05/19/2015   Osteoporosis 03/25/2015   Dry eye 03/25/2015   Hyperthyroidism 01/06/2015   Hair loss 09/18/2014   Goiter, toxic, 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 TAKE WITH A FULL GLASS OF WATER ON AN EMPTY STOMACH. 12 tablet 2   amitriptyline (ELAVIL) 10 MG tablet Take 1 tablet (10 mg total) by mouth at bedtime. 90 tablet 3   b complex vitamins capsule Take 1 capsule by mouth daily.     Biotin 1 MG CAPS Take by mouth.     Cholecalciferol (CVS VIT D 5000 HIGH-POTENCY PO) Take 5,000 Units by mouth daily.     clindamycin (CLEOCIN) 300 MG capsule Take 1 capsule (300 mg total) by mouth 3 (three) times daily. 30 capsule 0   EVENING PRIMROSE OIL PO Take by mouth.     fluocinonide (LIDEX) 0.05 % external solution   6   Lactobacillus Casei-Folic Acid (RESTORA RX) 60-1.25 MG CAPS TAKE (1) CAPSULE DAILY. 90 capsule 3   Magnesium 500 MG CAPS Take 500 mg by mouth daily.     methimazole (TAPAZOLE) 5 MG tablet Take 0.5 tablets (2.5 mg total) by mouth daily. 45 tablet 3   nystatin (MYCOSTATIN) 500000 units TABS tablet  Potassium 99 MG TABS Take 99 mg by mouth daily.     Potassium Gluconate 2.5 MEQ TABS Take by mouth.     Theanine 100 MG CAPS Take 100 mg by mouth 2 (two) times daily.     valACYclovir (VALTREX) 500 MG tablet Take 1 tablet (500 mg total) by mouth daily. 90 tablet 1   vitamin C (ASCORBIC ACID) 500 MG tablet Take 500 mg by mouth daily.     No current facility-administered medications on file prior to visit.    Past Medical History:  Diagnosis Date   Acetabular labrum tear    right/ per pt history   Arthritis    Gall bladder disease    per pt report   Ganglion cyst    GERD (gastroesophageal reflux disease)    Hemorrhoids    Low back pain 08/20/2016   Menopause    Osteoporosis    Telogen effluvium    Thyroid disease     Past Surgical History:  Procedure Laterality Date   BREAST SURGERY   2002   cyst on Left breast   CYSTECTOMY  1999   Right elbow   HEMORRHOID BANDING  07/2014   JOINT REPLACEMENT     KNEE ARTHROSCOPY Left 09/10/2013   Procedure: LEFT MEDIAL AND LATERAL KNEE ARTHROSCOPY WITH MENISCAL DEBRIDEMENT ;  Surgeon: Gearlean Alf, MD;  Location: [facility];  Service: Orthopedics;  Laterality: Left;   NASAL SINUS SURGERY  1995, 2002   TOTAL HIP ARTHROPLASTY  09/12/2011   Procedure: TOTAL HIP ARTHROPLASTY ANTERIOR APPROACH;  Surgeon: Mauri Pole, MD;  Location: [facility];  Service: Orthopedics;  Laterality: Left;    Social History   Socioeconomic History   Marital status: Divorced    Spouse name: Not on file   Number of children: Not on file   Years of education: Not on file   Highest education level: Not on file  Occupational History   Occupation: Accountant  Tobacco Use   Smoking status: Never   Smokeless tobacco: Never  Vaping Use   Vaping Use: Never used  Substance and Sexual Activity   Alcohol use: Yes    Alcohol/week: 1.0 standard drink    Types: 1 Standard drinks or equivalent per week    Comment: once weekly or 2-3 drinks per month   Drug use: No   Sexual activity: Not Currently  Other Topics Concern   Not on file  Social History Narrative   Not on file   Social Determinants of Health   Financial Resource Strain: Not on file  Food Insecurity: Not on file  Transportation Needs: Not on file  Physical Activity: Not on file  Stress: Not on file  Social Connections: Not on file    Family History  Problem Relation Age of Onset   Stroke Mother    Nephrolithiasis Mother    Diabetes Mother    Aneurysm Mother        x2   Cancer Mother        lymphoma, breast cancer   Depression Father    Parkinsonism Father    Drug abuse Father    Parkinson's disease Brother    Mental illness Sister        paranoia    Review of Systems  Constitutional:  Positive for chills and fatigue. Negative for fever.  HENT:  Negative for congestion, sinus pain  and sore throat.   Respiratory:  Negative for cough, chest tightness, shortness of breath and wheezing.  Cardiovascular:  Negative for chest pain, palpitations and leg swelling.       Fast HR at times  Gastrointestinal:  Negative for abdominal pain, blood in stool, constipation, diarrhea and nausea.  Genitourinary:  Positive for frequency. Negative for dysuria and hematuria.  Musculoskeletal:  Positive for arthralgias (inc joint pain with flare) and myalgias (inc muscle pain with flare).  Skin:  Negative for rash.  Neurological:  Positive for light-headedness (a little) and headaches (inflammatory in head - no true headache).      Objective:   Vitals:   10/05/20 1109  BP: 114/80  Pulse: 67  Temp: 97.9 F (36.6 C)  SpO2: 99%   BP Readings from Last 3 Encounters:  10/05/20 114/80  02/16/20 110/80  05/01/19 118/80   Wt Readings from Last 3 Encounters:  10/05/20 136 lb (61.7 kg)  02/16/20 138 lb (62.6 kg)  05/01/19 136 lb (61.7 kg)   Body mass index is 21.3 kg/m.   Physical Exam Constitutional:      General: She is not in acute distress.    Appearance: Normal appearance. She is not ill-appearing.  HENT:     Head: Normocephalic and atraumatic.  Cardiovascular:     Rate and Rhythm: Normal rate and regular rhythm.     Heart sounds: No murmur heard. Pulmonary:     Effort: Pulmonary effort is normal. No respiratory distress.     Breath sounds: No wheezing or rales.  Musculoskeletal:        General: No swelling (No joint swelling).     Right lower leg: No edema.     Left lower leg: No edema.  Skin:    General: Skin is warm and dry.     Findings: No rash.  Neurological:     General: No focal deficit present.     Mental Status: She is alert.  Psychiatric:        Mood and Affect: Mood normal.           Assessment & Plan:    See Problem List for Assessment and Plan of chronic medical problems.    This visit occurred during the SARS-CoV-2 public health  emergency.  Safety protocols were in place, including screening questions prior to the visit, additional usage of staff PPE, and extensive cleaning of exam room while observing appropriate contact time as indicated for disinfecting solutions.

## 2020-10-04 NOTE — Patient Instructions (Addendum)
  Blood work was ordered.     Medications changes include :   none     

## 2020-10-05 ENCOUNTER — Other Ambulatory Visit: Payer: Self-pay

## 2020-10-05 ENCOUNTER — Ambulatory Visit: Payer: BLUE CROSS/BLUE SHIELD | Admitting: Internal Medicine

## 2020-10-05 ENCOUNTER — Encounter: Payer: Self-pay | Admitting: Internal Medicine

## 2020-10-05 ENCOUNTER — Ambulatory Visit: Payer: BLUE CROSS/BLUE SHIELD | Admitting: Family Medicine

## 2020-10-05 VITALS — BP 114/80 | HR 67 | Temp 97.9°F | Ht 67.0 in | Wt 136.0 lb

## 2020-10-05 DIAGNOSIS — M791 Myalgia, unspecified site: Secondary | ICD-10-CM

## 2020-10-05 DIAGNOSIS — R5383 Other fatigue: Secondary | ICD-10-CM

## 2020-10-05 DIAGNOSIS — R350 Frequency of micturition: Secondary | ICD-10-CM | POA: Insufficient documentation

## 2020-10-05 DIAGNOSIS — M2550 Pain in unspecified joint: Secondary | ICD-10-CM

## 2020-10-05 DIAGNOSIS — E0590 Thyrotoxicosis, unspecified without thyrotoxic crisis or storm: Secondary | ICD-10-CM

## 2020-10-05 LAB — COMPREHENSIVE METABOLIC PANEL
ALT: 18 U/L (ref 0–35)
AST: 21 U/L (ref 0–37)
Albumin: 5 g/dL (ref 3.5–5.2)
Alkaline Phosphatase: 52 U/L (ref 39–117)
BUN: 17 mg/dL (ref 6–23)
CO2: 30 mEq/L (ref 19–32)
Calcium: 9.7 mg/dL (ref 8.4–10.5)
Chloride: 103 mEq/L (ref 96–112)
Creatinine, Ser: 0.77 mg/dL (ref 0.40–1.20)
GFR: 79.71 mL/min (ref >60.00)
Glucose, Bld: 89 mg/dL (ref 70–99)
Potassium: 4.3 mEq/L (ref 3.5–5.1)
Sodium: 141 mEq/L (ref 135–145)
Total Bilirubin: 1 mg/dL (ref 0.2–1.2)
Total Protein: 7.6 g/dL (ref 6.0–8.3)

## 2020-10-05 LAB — CBC WITH DIFFERENTIAL/PLATELET
Basophils Absolute: 0 10*3/uL (ref 0.0–0.1)
Basophils Relative: 0.7 % (ref 0.0–3.0)
Eosinophils Absolute: 0.1 10*3/uL (ref 0.0–0.7)
Eosinophils Relative: 0.8 % (ref 0.0–5.0)
HCT: 42.6 % (ref 36.0–46.0)
Hemoglobin: 14.2 g/dL (ref 12.0–15.0)
Lymphocytes Relative: 23.9 % (ref 12.0–46.0)
Lymphs Abs: 1.6 10*3/uL (ref 0.7–4.0)
MCHC: 33.3 g/dL (ref 30.0–36.0)
MCV: 87.9 fl (ref 78.0–100.0)
Monocytes Absolute: 0.4 10*3/uL (ref 0.1–1.0)
Monocytes Relative: 5.6 % (ref 3.0–12.0)
Neutro Abs: 4.6 10*3/uL (ref 1.4–7.7)
Neutrophils Relative %: 69 % (ref 43.0–77.0)
Platelets: 253 10*3/uL (ref 150.0–400.0)
RBC: 4.85 Mil/uL (ref 3.87–5.11)
RDW: 13.2 % (ref 11.5–15.5)
WBC: 6.7 10*3/uL (ref 4.0–10.5)

## 2020-10-05 LAB — URINALYSIS, ROUTINE W REFLEX MICROSCOPIC
Bilirubin Urine: NEGATIVE
Ketones, ur: NEGATIVE
Leukocytes,Ua: NEGATIVE
Nitrite: NEGATIVE
Specific Gravity, Urine: <=1.005 — AB (ref 1.000–1.030)
Total Protein, Urine: NEGATIVE
Urine Glucose: NEGATIVE
Urobilinogen, UA: 0.2 (ref 0.0–1.0)
WBC, UA: NONE SEEN (ref 0–?)
pH: 6 (ref 5.0–8.0)

## 2020-10-05 LAB — SEDIMENTATION RATE: Sed Rate: 2 mm/hr (ref 0–30)

## 2020-10-05 LAB — C-REACTIVE PROTEIN: CRP: <1 mg/dL (ref 0.5–20.0)

## 2020-10-05 LAB — TSH: TSH: 3.01 u[IU]/mL (ref 0.35–4.50)

## 2020-10-05 NOTE — Assessment & Plan Note (Signed)
Acute Recent flare of increased muscle pain associated with increased joint pain, fatigue, lymphadenopathy ?  Autoimmune in nature Check ANA, ESR, CRP, RF, CCP, CBC, CMP, TSH

## 2020-10-05 NOTE — Assessment & Plan Note (Signed)
Acute Has been having intermittent flares of fatigue for years No obvious cause Most recently this episode was associated with 10 days of fatigue, increased joint pain, increased muscle pain, lymphadenopathy, lower back pain feeling of inflammation in her head Starting to feel better now Check autoimmune blood work, CBC, CMP and TSH

## 2020-10-05 NOTE — Assessment & Plan Note (Signed)
Chronic I do not think that her current symptoms are related to her thyroid Check TSH Continue methimazole 2.5 mg daily

## 2020-10-05 NOTE — Assessment & Plan Note (Signed)
Acute Recent flare of increased joint pain associated with increased muscle pain, fatigue, lymphadenopathy No joint swelling ?  Autoimmune in nature Check ANA, ESR, CRP, RF, CCP, CBC, CMP, TSH

## 2020-10-05 NOTE — Assessment & Plan Note (Signed)
Acute Experiencing increased urinary frequency Will check urinalysis, urine culture to rule out infection

## 2020-10-07 LAB — URINE CULTURE: Result:: NO GROWTH

## 2020-10-07 LAB — CYCLIC CITRUL PEPTIDE ANTIBODY, IGG: Cyclic Citrullin Peptide Ab: <16 UNITS

## 2020-10-07 LAB — ANA: Anti Nuclear Antibody (ANA): POSITIVE — AB

## 2020-10-07 LAB — ANTI-NUCLEAR AB-TITER (ANA TITER): ANA Titer 1: 1:40 {titer} — ABNORMAL HIGH

## 2020-10-07 LAB — RHEUMATOID FACTOR: Rheumatoid fact SerPl-aCnc: <14 IU/mL (ref <14)

## 2020-10-13 ENCOUNTER — Encounter: Payer: Self-pay | Admitting: Internal Medicine

## 2020-10-29 ENCOUNTER — Other Ambulatory Visit: Payer: Self-pay | Admitting: Internal Medicine

## 2020-11-02 NOTE — Progress Notes (Deleted)
[place] Phone: [phone] Subjective:    I'm seeing this patient by the request  of:  Burns, Claudina Lick, MD  CC:   [id number]  Patricia Conley is a 70 y.o. female coming in with complaint of ganglion cysts. Patient last seen in 2019 for R 1st MTP pain. Patient states        Past Medical History:  Diagnosis Date   Acetabular labrum tear    right/ per pt history   Arthritis    Gall bladder disease    per pt report   Ganglion cyst    GERD (gastroesophageal reflux disease)    Hemorrhoids    Low back pain 08/20/2016   Menopause    Osteoporosis    Telogen effluvium    Thyroid disease    Past Surgical History:  Procedure Laterality Date   BREAST SURGERY  2002   cyst on Left breast   CYSTECTOMY  1999   Right elbow   HEMORRHOID BANDING  07/2014   JOINT REPLACEMENT     KNEE ARTHROSCOPY Left 09/10/2013   Procedure: LEFT MEDIAL AND LATERAL KNEE ARTHROSCOPY WITH MENISCAL DEBRIDEMENT ;  Surgeon: Gearlean Alf, MD;  Location: [facility];  Service: Orthopedics;  Laterality: Left;   NASAL SINUS SURGERY  1995, 2002   TOTAL HIP ARTHROPLASTY  09/12/2011   Procedure: TOTAL HIP ARTHROPLASTY ANTERIOR APPROACH;  Surgeon: Mauri Pole, MD;  Location: [facility];  Service: Orthopedics;  Laterality: Left;   Social History   Socioeconomic History   Marital status: Divorced    Spouse name: Not on file   Number of children: Not on file   Years of education: Not on file   Highest education level: Not on file  Occupational History   Occupation: Accountant  Tobacco Use   Smoking status: Never   Smokeless tobacco: Never  Vaping Use   Vaping Use: Never used  Substance and Sexual Activity   Alcohol use: Yes    Alcohol/week: 1.0 standard drink    Types: 1 Standard drinks or equivalent per week    Comment: once weekly or 2-3 drinks per month   Drug use: No   Sexual activity: Not Currently  Other Topics Concern    Not on file  Social History Narrative   Not on file   Social Determinants of Health   Financial Resource Strain: Not on file  Food Insecurity: Not on file  Transportation Needs: Not on file  Physical Activity: Not on file  Stress: Not on file  Social Connections: Not on file   Allergies  Allergen Reactions   Histamine Rash    Flushing   Family History  Problem Relation Age of Onset   Stroke Mother    Nephrolithiasis Mother    Diabetes Mother    Aneurysm Mother        x2   Cancer Mother        lymphoma, breast cancer   Depression Father    Parkinsonism Father    Drug abuse Father    Parkinson's disease Brother    Mental illness Sister        paranoia    Current Outpatient Medications (Endocrine & Metabolic):    alendronate (FOSAMAX) 70 MG tablet, TAKE 1 TABLET BY MOUTH ONCE WEEKLY. TAKE WITH A FULL GLASS OF WATER ON AN EMPTY STOMACH.   methimazole (TAPAZOLE) 5 MG tablet, Take 0.5 tablets (2.5 mg total) by mouth daily.  Current Outpatient Medications (Other):    amitriptyline (ELAVIL) 10 MG tablet, TAKE ONE TABLET AT BEDTIME.   b complex vitamins capsule, Take 1 capsule by mouth daily.   Biotin 1 MG CAPS, Take by mouth.   Cholecalciferol (CVS VIT D 5000 HIGH-POTENCY PO), Take 5,000 Units by mouth daily.   EVENING PRIMROSE OIL PO, Take by mouth.   fluocinonide (LIDEX) 0.05 % external solution,    Lactobacillus Casei-Folic Acid (RESTORA RX) 60-1.25 MG CAPS, TAKE (1) CAPSULE DAILY.   Magnesium 500 MG CAPS, Take 500 mg by mouth daily.   nystatin (MYCOSTATIN) 500000 units TABS tablet,    Potassium 99 MG TABS, Take 99 mg by mouth daily.   Potassium Gluconate 2.5 MEQ TABS, Take by mouth.   Theanine 100 MG CAPS, Take 100 mg by mouth 2 (two) times daily.   valACYclovir (VALTREX) 500 MG tablet, Take 1 tablet (500 mg total) by mouth daily.   vitamin C (ASCORBIC ACID) 500 MG tablet, Take 500 mg by mouth daily.   Reviewed prior external information including notes and  imaging from  primary care provider As well as notes that were available from care everywhere and other healthcare systems.  Past medical history, social, surgical and family history all reviewed in electronic medical record.  No pertanent information unless stated regarding to the chief complaint.   Review of Systems:  No headache, visual changes, nausea, vomiting, diarrhea, constipation, dizziness, abdominal pain, skin rash, fevers, chills, night sweats, weight loss, swollen lymph nodes, body aches, joint swelling, chest pain, shortness of breath, mood changes. POSITIVE muscle aches  Objective  There were no vitals taken for this visit.   General: No apparent distress alert and oriented x3 mood and affect normal, dressed appropriately.  HEENT: Pupils equal, extraocular movements intact  Respiratory: Patient's speak in full sentences and does not appear [place] of breath  Cardiovascular: No lower extremity edema, non tender, no erythema  Gait normal with good balance and coordination.  MSK:  Non tender with full range of motion and good stability and symmetric strength and tone of shoulders, elbows, wrist, hip, knee and ankles bilaterally.     Impression and Recommendations:     The above documentation has been reviewed and is accurate and complete Jacqualin Combes

## 2020-11-05 ENCOUNTER — Ambulatory Visit: Payer: BLUE CROSS/BLUE SHIELD | Admitting: Family Medicine

## 2020-11-16 ENCOUNTER — Ambulatory Visit: Payer: BLUE CROSS/BLUE SHIELD | Admitting: Family Medicine

## 2020-11-16 NOTE — Progress Notes (Deleted)
Patricia Conley Phone: [phone] Subjective:    I'm seeing this patient by the request  of:  Binnie Rail, MD  CC: Ganglion cyst  [id number]  Patricia Conley is a 70 y.o. female coming in with complaint of ganglion cysts. Patient states       Past Medical History:  Diagnosis Date   Acetabular labrum tear    right/ per pt history   Arthritis    Gall bladder disease    per pt report   Ganglion cyst    GERD (gastroesophageal reflux disease)    Hemorrhoids    Low back pain 08/20/2016   Menopause    Osteoporosis    Telogen effluvium    Thyroid disease    Past Surgical History:  Procedure Laterality Date   BREAST SURGERY  2002   cyst on Left breast   CYSTECTOMY  1999   Right elbow   HEMORRHOID BANDING  07/2014   JOINT REPLACEMENT     KNEE ARTHROSCOPY Left 09/10/2013   Procedure: LEFT MEDIAL AND LATERAL KNEE ARTHROSCOPY WITH MENISCAL DEBRIDEMENT ;  Surgeon: Gearlean Alf, MD;  Location: [facility];  Service: Orthopedics;  Laterality: Left;   NASAL SINUS SURGERY  1995, 2002   TOTAL HIP ARTHROPLASTY  09/12/2011   Procedure: TOTAL HIP ARTHROPLASTY ANTERIOR APPROACH;  Surgeon: Mauri Pole, MD;  Location: [facility];  Service: Orthopedics;  Laterality: Left;   Social History   Socioeconomic History   Marital status: Divorced    Spouse name: Not on file   Number of children: Not on file   Years of education: Not on file   Highest education level: Not on file  Occupational History   Occupation: Accountant  Tobacco Use   Smoking status: Never   Smokeless tobacco: Never  Vaping Use   Vaping Use: Never used  Substance and Sexual Activity   Alcohol use: Yes    Alcohol/week: 1.0 standard drink    Types: 1 Standard drinks or equivalent per week    Comment: once weekly or 2-3 drinks per month   Drug use: No   Sexual activity: Not Currently  Other Topics Concern   Not on file  Social History  Narrative   Not on file   Social Determinants of Health   Financial Resource Strain: Not on file  Food Insecurity: Not on file  Transportation Needs: Not on file  Physical Activity: Not on file  Stress: Not on file  Social Connections: Not on file   Allergies  Allergen Reactions   Histamine Rash    Flushing   Family History  Problem Relation Age of Onset   Stroke Mother    Nephrolithiasis Mother    Diabetes Mother    Aneurysm Mother        x2   Cancer Mother        lymphoma, breast cancer   Depression Father    Parkinsonism Father    Drug abuse Father    Parkinson's disease Brother    Mental illness Sister        paranoia    Current Outpatient Medications (Endocrine & Metabolic):    alendronate (FOSAMAX) 70 MG tablet, TAKE 1 TABLET BY MOUTH ONCE WEEKLY. TAKE WITH A FULL GLASS OF WATER ON AN EMPTY STOMACH.   methimazole (TAPAZOLE) 5 MG tablet, Take 0.5 tablets (2.5 mg total) by mouth daily.      Current Outpatient Medications (Other):  amitriptyline (ELAVIL) 10 MG tablet, TAKE ONE TABLET AT BEDTIME.   b complex vitamins capsule, Take 1 capsule by mouth daily.   Biotin 1 MG CAPS, Take by mouth.   Cholecalciferol (CVS VIT D 5000 HIGH-POTENCY PO), Take 5,000 Units by mouth daily.   EVENING PRIMROSE OIL PO, Take by mouth.   fluocinonide (LIDEX) 0.05 % external solution,    Lactobacillus Casei-Folic Acid (RESTORA RX) 60-1.25 MG CAPS, TAKE (1) CAPSULE DAILY.   Magnesium 500 MG CAPS, Take 500 mg by mouth daily.   nystatin (MYCOSTATIN) 500000 units TABS tablet,    Potassium 99 MG TABS, Take 99 mg by mouth daily.   Potassium Gluconate 2.5 MEQ TABS, Take by mouth.   Theanine 100 MG CAPS, Take 100 mg by mouth 2 (two) times daily.   valACYclovir (VALTREX) 500 MG tablet, Take 1 tablet (500 mg total) by mouth daily.   vitamin C (ASCORBIC ACID) 500 MG tablet, Take 500 mg by mouth daily.   Reviewed prior external information including notes and imaging from  primary care  provider As well as notes that were available from care everywhere and other healthcare systems.  Past medical history, social, surgical and family history all reviewed in electronic medical record.  No pertanent information unless stated regarding to the chief complaint.   Review of Systems:  No headache, visual changes, nausea, vomiting, diarrhea, constipation, dizziness, abdominal pain, skin rash, fevers, chills, night sweats, weight loss, swollen lymph nodes, body aches, joint swelling, chest pain, shortness of breath, mood changes. POSITIVE muscle aches  Objective  There were no vitals taken for this visit.   General: No apparent distress alert and oriented x3 mood and affect normal, dressed appropriately.  HEENT: Pupils equal, extraocular movements intact  Respiratory: Patient's speak in full sentences and does not appear [place] of breath  Cardiovascular: No lower extremity edema, non tender, no erythema  Gait normal with good balance and coordination.  MSK:  Non tender with full range of motion and good stability and symmetric strength and tone of shoulders, elbows, wrist, hip, knee and ankles bilaterally.     Impression and Recommendations:     The above documentation has been reviewed and is accurate and complete Lyndal Pulley, DO

## 2020-11-17 ENCOUNTER — Ambulatory Visit: Payer: BLUE CROSS/BLUE SHIELD | Admitting: Family Medicine

## 2020-12-05 ENCOUNTER — Encounter: Payer: Self-pay | Admitting: Internal Medicine

## 2020-12-08 ENCOUNTER — Encounter: Payer: Self-pay | Admitting: Internal Medicine

## 2020-12-09 ENCOUNTER — Other Ambulatory Visit: Payer: Self-pay | Admitting: Internal Medicine

## 2020-12-09 ENCOUNTER — Other Ambulatory Visit: Payer: Self-pay

## 2020-12-09 DIAGNOSIS — R208 Other disturbances of skin sensation: Secondary | ICD-10-CM

## 2020-12-09 MED ORDER — METHIMAZOLE 5 MG PO TABS
2.5000 mg | ORAL_TABLET | Freq: Every day | ORAL | 3 refills | Status: DC
Start: 2020-12-09 — End: 2021-04-12

## 2020-12-09 MED ORDER — AMITRIPTYLINE HCL 10 MG PO TABS: 10.0000 mg | ORAL_TABLET | Freq: Every day | ORAL | 0 refills | Status: DC

## 2020-12-09 MED ORDER — ALENDRONATE SODIUM 70 MG PO TABS: ORAL_TABLET | ORAL | 8 refills | Status: DC

## 2020-12-14 ENCOUNTER — Telehealth: Payer: Self-pay

## 2020-12-15 NOTE — Telephone Encounter (Signed)
Faxed on 12/09/20

## 2020-12-27 ENCOUNTER — Telehealth: Payer: Self-pay | Admitting: Internal Medicine

## 2020-12-28 NOTE — Telephone Encounter (Signed)
Faxed back today.  Duplicate copy

## 2020-12-29 MED ORDER — ALENDRONATE SODIUM 70 MG PO TABS: 70.0000 mg | ORAL_TABLET | ORAL | 3 refills | Status: DC

## 2020-12-29 NOTE — Addendum Note (Signed)
Addended by: Binnie Rail on: 12/29/2020 12:01 PM   Modules accepted: Orders

## 2020-12-29 NOTE — Telephone Encounter (Signed)
[facility] is calling saying the infomartion faxed back to them on yesterday did not clarify how patient should be taking meds  Please send new rx over to them w/ clarifying dosage & directions  (F) [phone]

## 2021-02-17 ENCOUNTER — Encounter: Payer: BLUE CROSS/BLUE SHIELD | Admitting: Internal Medicine

## 2021-02-21 ENCOUNTER — Other Ambulatory Visit: Payer: Self-pay | Admitting: Internal Medicine

## 2021-02-21 DIAGNOSIS — R208 Other disturbances of skin sensation: Secondary | ICD-10-CM

## 2021-02-27 ENCOUNTER — Other Ambulatory Visit: Payer: Self-pay | Admitting: Internal Medicine

## 2021-02-27 DIAGNOSIS — R208 Other disturbances of skin sensation: Secondary | ICD-10-CM

## 2021-03-02 ENCOUNTER — Encounter: Payer: Self-pay | Admitting: Internal Medicine

## 2021-03-02 ENCOUNTER — Other Ambulatory Visit: Payer: Self-pay | Admitting: Internal Medicine

## 2021-03-02 ENCOUNTER — Ambulatory Visit: Payer: BLUE CROSS/BLUE SHIELD

## 2021-03-02 DIAGNOSIS — R208 Other disturbances of skin sensation: Secondary | ICD-10-CM

## 2021-03-03 MED ORDER — AMITRIPTYLINE HCL 10 MG PO TABS: 10.0000 mg | ORAL_TABLET | Freq: Every day | ORAL | 2 refills | Status: DC

## 2021-03-04 ENCOUNTER — Ambulatory Visit (INDEPENDENT_AMBULATORY_CARE_PROVIDER_SITE_OTHER): Payer: BLUE CROSS/BLUE SHIELD

## 2021-03-04 ENCOUNTER — Other Ambulatory Visit: Payer: Self-pay

## 2021-03-04 DIAGNOSIS — Z23 Encounter for immunization: Secondary | ICD-10-CM

## 2021-03-04 NOTE — Progress Notes (Signed)
Pt was given HD flu vacc w/o any complications. 

## 2021-03-22 ENCOUNTER — Encounter: Payer: BLUE CROSS/BLUE SHIELD | Admitting: Internal Medicine

## 2021-03-23 NOTE — Progress Notes (Signed)
Zach Raelea Gosse [place] [address] [place] Phone: [phone] Subjective:   IVilma Meckel, am serving as a scribe for Dr. Hulan Saas. This visit occurred during the SARS-CoV-2 public health emergency.  Safety protocols were in place, including screening questions prior to the visit, additional usage of staff PPE, and extensive cleaning of exam room while observing appropriate contact time as indicated for disinfecting solutions.   I'm seeing this patient by the request  of:  Burns, Claudina Lick, MD  CC: Hand pain  [id number]  Patricia Conley is a 70 y.o. female coming in with complaint of B hand pain. 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 Patient states left hand cyst cluster. Causes pain and feeling sensations. Started a couple of months ago and has gotten worse.  Patient had previous see me for what appeared to be more of a ganglion cyst of the finger on the right hand than left and responded well to aspiration in 2018.  Reviewing patient's chart in 2021 patient did have an ingrown fingernail of the right middle finger.     Past Medical History:  Diagnosis Date   Acetabular labrum tear    right/ per pt history   Arthritis    Gall bladder disease    per pt report   Ganglion cyst    GERD (gastroesophageal reflux disease)    Hemorrhoids    Low back pain 08/20/2016   Menopause    Osteoporosis    Telogen effluvium    Thyroid disease    Past Surgical History:  Procedure Laterality Date   BREAST SURGERY  2002   cyst on Left breast   CYSTECTOMY  1999   Right elbow   HEMORRHOID BANDING  07/2014   JOINT REPLACEMENT     KNEE ARTHROSCOPY Left 09/10/2013   Procedure: LEFT MEDIAL AND LATERAL KNEE ARTHROSCOPY WITH MENISCAL DEBRIDEMENT ;  Surgeon: Gearlean Alf, MD;  Location: [facility];  Service: Orthopedics;  Laterality: Left;   NASAL SINUS SURGERY  1995, 2002   TOTAL HIP ARTHROPLASTY  09/12/2011   Procedure: TOTAL HIP  ARTHROPLASTY ANTERIOR APPROACH;  Surgeon: Mauri Pole, MD;  Location: [facility];  Service: Orthopedics;  Laterality: Left;   Social History   Socioeconomic History   Marital status: Divorced    Spouse name: Not on file   Number of children: Not on file   Years of education: Not on file   Highest education level: Not on file  Occupational History   Occupation: Accountant  Tobacco Use   Smoking status: Never   Smokeless tobacco: Never  Vaping Use   Vaping Use: Never used  Substance and Sexual Activity   Alcohol use: Yes    Alcohol/week: 1.0 standard drink    Types: 1 Standard drinks or equivalent per week    Comment: once weekly or 2-3 drinks per month   Drug use: No   Sexual activity: Not Currently  Other Topics Concern   Not on file  Social History Narrative   Not on file   Social Determinants of Health   Financial Resource Strain: Not on file  Food Insecurity: Not on file  Transportation Needs: Not on file  Physical Activity: Not on file  Stress: Not on file  Social Connections: Not on file   Allergies  Allergen Reactions   Histamine Rash    Flushing   Family History  Problem Relation Age of Onset   Stroke Mother  Nephrolithiasis Mother    Diabetes Mother    Aneurysm Mother        x2   Cancer Mother        lymphoma, breast cancer   Depression Father    Parkinsonism Father    Drug abuse Father    Parkinson's disease Brother    Mental illness Sister        paranoia    Current Outpatient Medications (Endocrine & Metabolic):    alendronate (FOSAMAX) 70 MG tablet, Take 1 tablet (70 mg total) by mouth once a week. Take with a full glass of water on an empty stomach.   methimazole (TAPAZOLE) 5 MG tablet, Take 0.5 tablets (2.5 mg total) by mouth daily.      Current Outpatient Medications (Other):    amitriptyline (ELAVIL) 10 MG tablet, Take 1 tablet (10 mg total) by mouth at bedtime.   b complex vitamins capsule, Take 1 capsule by mouth daily.    Biotin 1 MG CAPS, Take by mouth.   Cholecalciferol (CVS VIT D 5000 HIGH-POTENCY PO), Take 5,000 Units by mouth daily.   EVENING PRIMROSE OIL PO, Take by mouth.   fluocinonide (LIDEX) 0.05 % external solution,    Lactobacillus Casei-Folic Acid (RESTORA RX) 60-1.25 MG CAPS, TAKE (1) CAPSULE DAILY.   Magnesium 500 MG CAPS, Take 500 mg by mouth daily.   nystatin (MYCOSTATIN) 500000 units TABS tablet,    Potassium 99 MG TABS, Take 99 mg by mouth daily.   Potassium Gluconate 2.5 MEQ TABS, Take by mouth.   Theanine 100 MG CAPS, Take 100 mg by mouth 2 (two) times daily.   valACYclovir (VALTREX) 500 MG tablet, Take 1 tablet (500 mg total) by mouth daily.   vitamin C (ASCORBIC ACID) 500 MG tablet, Take 500 mg by mouth daily.   Reviewed prior external information including notes and imaging from  primary care provider As well as notes that were available from care everywhere and other healthcare systems.  Past medical history, social, surgical and family history all reviewed in electronic medical record.  No pertanent information unless stated regarding to the chief complaint.   Review of Systems:  No headache, visual changes, nausea, vomiting, diarrhea, constipation, dizziness, abdominal pain, skin rash, fevers, chills, night sweats, weight loss, swollen lymph nodes, body aches, joint swelling, chest pain, shortness of breath, mood changes. POSITIVE muscle aches  Objective  Blood pressure 122/82, pulse 69, height 5\' 7"  (1.702 m), weight 141 lb (64 kg), SpO2 98 %.   General: No apparent distress alert and oriented x3 mood and affect normal, dressed appropriately.  HEENT: Pupils equal, extraocular movements intact  Respiratory: Patient's speak in full sentences and does not appear [place] of breath  Cardiovascular: No lower extremity edema, non tender, no erythema  Gait normal with good balance and coordination.  MSK: Patient's hand exam shows the patient does have a tightness noted of the hand  flexors over the fourth and fifth.  Patient also has nodules noted.  Does not seem to be in the tendon sheaths themselves.  Limited muscular skeletal ultrasound was performed and interpreted by Hulan Saas, M  Limited musculoskeletal ultrasound of patient's hand shows the patient does have a ganglion cyst of the scapholunate aspect of the dorsal aspect but not fairly large at the moment.  He does have some tearing noted of the TFCC noted. Patient does have what appears to be more of an epidermoid cyst on the palmar aspect of the hand.  It does not seem  to communicate with the tendon and stays within the dermal layer.   Procedure: Real-time Ultrasound Guided Injection of left hand cyst Device: GE Logiq Q7 Ultrasound guided injection is preferred based studies that show increased duration, increased effect, greater accuracy, decreased procedural pain, increased response rate, and decreased cost with ultrasound guided versus blind injection.  Verbal informed consent obtained.  Time-out conducted.  Noted no overlying erythema, induration, or other signs of local infection.  Skin prepped in a sterile fashion.  Local anesthesia: Topical Ethyl chloride.  With sterile technique and under real time ultrasound guidance: With a 25-gauge half inch needle injecting 0.5 cc of 0.5% Marcaine and 0.5 cc of Kenalog 40 mg/mL just superficial to at the third and fourth flexor tendon sheath. Completed without difficulty  Pain immediately resolved suggesting accurate placement of the medication.  Advised to call if fevers/chills, erythema, induration, drainage, or persistent bleeding.  Impression: Technically successful ultrasound guided injection.   Impression and Recommendations:     The above documentation has been reviewed and is accurate and complete Lyndal Pulley, DO

## 2021-03-24 ENCOUNTER — Encounter: Payer: Self-pay | Admitting: Family Medicine

## 2021-03-24 ENCOUNTER — Ambulatory Visit: Payer: Self-pay

## 2021-03-24 ENCOUNTER — Ambulatory Visit: Payer: BLUE CROSS/BLUE SHIELD | Admitting: Family Medicine

## 2021-03-24 ENCOUNTER — Other Ambulatory Visit: Payer: Self-pay

## 2021-03-24 VITALS — BP 122/82 | HR 69 | Ht 67.0 in | Wt 141.0 lb

## 2021-03-24 DIAGNOSIS — M67441 Ganglion, right hand: Secondary | ICD-10-CM | POA: Diagnosis not present

## 2021-03-24 DIAGNOSIS — L720 Epidermal cyst: Secondary | ICD-10-CM | POA: Diagnosis not present

## 2021-03-24 NOTE — Patient Instructions (Addendum)
Injection today Daughter needs a cardiologist If any redness or swelling seek medical attention See you again in 3-4 weeks for possible wrist cysts

## 2021-03-24 NOTE — Assessment & Plan Note (Addendum)
Injection given today.  He did not seem to be more of a ganglion.  Significant more secondary to a soft tissue related epidermoid.  It does not seem to be more of a trigger nodule the patient does have tightness noted of the tendons.  We will continue.  DIP joint contracture but do not think some.  Discussed with patient about changing different environment with her ergonomics with taking a significant amount.  Patient does have also no swelling noted of the TFCC and likely a ganglion cyst in the scaphoid lunate area.  May consider the possibility of injection and aspiration in that area.  Discussed with patient icing regimen and home exercises and potential bracing.  Follow-up again in 4-8 weeks

## 2021-04-10 ENCOUNTER — Encounter: Payer: Self-pay | Admitting: Family Medicine

## 2021-04-11 ENCOUNTER — Encounter: Payer: Self-pay | Admitting: Internal Medicine

## 2021-04-11 NOTE — Patient Instructions (Addendum)
Blood work was ordered.     Medications changes include :   none  Your prescription(s) have been submitted to your pharmacy. Please take as directed and contact our office if you believe you are having problem(s) with the medication(s).    Please followup in 1 year   Health Maintenance, Female Adopting a healthy lifestyle and getting preventive care are important in promoting health and wellness. Ask your health care provider about: The right schedule for you to have regular tests and exams. Things you can do on your own to prevent diseases and keep yourself healthy. What should I know about diet, weight, and exercise? Eat a healthy diet  Eat a diet that includes plenty of vegetables, fruits, low-fat dairy products, and lean protein. Do not eat a lot of foods that are high in solid fats, added sugars, or sodium. Maintain a healthy weight Body mass index (BMI) is used to identify weight problems. It estimates body fat based on height and weight. Your health care provider can help determine your BMI and help you achieve or maintain a healthy weight. Get regular exercise Get regular exercise. This is one of the most important things you can do for your health. Most adults should: Exercise for at least 150 minutes each week. The exercise should increase your heart rate and make you sweat (moderate-intensity exercise). Do strengthening exercises at least twice a week. This is in addition to the moderate-intensity exercise. Spend less time sitting. Even light physical activity can be beneficial. Watch cholesterol and blood lipids Have your blood tested for lipids and cholesterol at 68 years of age, then have this test every 5 years. Have your cholesterol levels checked more often if: Your lipid or cholesterol levels are high. You are older than 68 years of age. You are at high risk for heart disease. What should I know about cancer screening? Depending on your health history and family  history, you may need to have cancer screening at various ages. This may include screening for: Breast cancer. Cervical cancer. Colorectal cancer. Skin cancer. Lung cancer. What should I know about heart disease, diabetes, and high blood pressure? Blood pressure and heart disease High blood pressure causes heart disease and increases the risk of stroke. This is more likely to develop in people who have high blood pressure readings or are overweight. Have your blood pressure checked: Every 3-5 years if you are 5-68 years of age. Every year if you are 65 years old or older. Diabetes Have regular diabetes screenings. This checks your fasting blood sugar level. Have the screening done: Once every three years after age 68 if you are at a normal weight and have a low risk for diabetes. More often and at a younger age if you are overweight or have a high risk for diabetes. What should I know about preventing infection? Hepatitis B If you have a higher risk for hepatitis B, you should be screened for this virus. Talk with your health care provider to find out if you are at risk for hepatitis B infection. Hepatitis C Testing is recommended for: Everyone born from [birth year] through 1963. Anyone with known risk factors for hepatitis C. Sexually transmitted infections (STIs) Get screened for STIs, including gonorrhea and chlamydia, if: You are sexually active and are younger than 68 years of age. You are older than 68 years of age and your health care provider tells you that you are at risk for this type of infection. Your sexual activity has changed  since you were last screened, and you are at increased risk for chlamydia or gonorrhea. Ask your health care provider if you are at risk. Ask your health care provider about whether you are at high risk for HIV. Your health care provider may recommend a prescription medicine to help prevent HIV infection. If you choose to take medicine to prevent HIV, you  should first get tested for HIV. You should then be tested every 3 months for as long as you are taking the medicine. Pregnancy If you are about to stop having your period (premenopausal) and you may become pregnant, seek counseling before you get pregnant. Take 400 to 800 micrograms (mcg) of folic acid every day if you become pregnant. Ask for birth control (contraception) if you want to prevent pregnancy. Osteoporosis and menopause Osteoporosis is a disease in which the bones lose minerals and strength with aging. This can result in bone fractures. If you are 68 years old or older, or if you are at risk for osteoporosis and fractures, ask your health care provider if you should: Be screened for bone loss. Take a calcium or vitamin D supplement to lower your risk of fractures. Be given hormone replacement therapy (HRT) to treat symptoms of menopause. Follow these instructions at home: Alcohol use Do not drink alcohol if: Your health care provider tells you not to drink. You are pregnant, may be pregnant, or are planning to become pregnant. If you drink alcohol: Limit how much you have to: 0-1 drink a day. Know how much alcohol is in your drink. In the [place] one drink equals one 12 oz bottle of beer (355 mL), one 5 oz glass of wine (148 mL), or one 1 oz glass of hard liquor (44 mL). Lifestyle Do not use any products that contain nicotine or tobacco. These products include cigarettes, chewing tobacco, and vaping devices, such as e-cigarettes. If you need help quitting, ask your health care provider. Do not use street drugs. Do not share needles. Ask your health care provider for help if you need support or information about quitting drugs. General instructions Schedule regular health, dental, and eye exams. Stay current with your vaccines. Tell your health care provider if: You often feel depressed. You have ever been abused or do not feel safe at home. Summary Adopting a healthy  lifestyle and getting preventive care are important in promoting health and wellness. Follow your health care provider's instructions about healthy diet, exercising, and getting tested or screened for diseases. Follow your health care provider's instructions on monitoring your cholesterol and blood pressure. This information is not intended to replace advice given to you by your health care provider. Make sure you discuss any questions you have with your health care provider. Document Revised: 08/30/2020 Document Reviewed: 08/30/2020 Elsevier Patient Education  [employer].

## 2021-04-11 NOTE — Progress Notes (Signed)
Subjective:    Patient ID: Patricia Conley, female    DOB: [birth year], 70 y.o.   MRN: [id number]   This visit occurred during the SARS-CoV-2 public health emergency.  Safety protocols were in place, including screening questions prior to the visit, additional usage of staff PPE, and extensive cleaning of exam room while observing appropriate contact time as indicated for disinfecting solutions.    HPI She is here for a physical exam.   She has been stress eating more - eating more sugar.    Medications and allergies reviewed with patient and updated if appropriate.  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   Xerostomia 10/18/2016   Allergic rhinitis caused by mold 10/18/2016   Ganglion cyst of finger of right hand 08/08/2016   Loss of transverse plantar arch 06/12/2016   Mass of joint of finger 04/16/2016   Pap smear abnormality of cervix with ASCUS favoring benign 02/08/2016   OSA (obstructive sleep apnea) 12/28/2015   Palpitations 08/05/2015   Genital herpes 07/27/2015   Osteoporosis 03/25/2015   Dry eye 03/25/2015   Hyperthyroidism 01/06/2015   Hair loss 09/18/2014   Goiter, toxic, 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 Take with a full glass of water on an empty stomach. 12 tablet 3   amitriptyline (ELAVIL) 10 MG tablet Take 1 tablet (10 mg total) by mouth at bedtime. 90 tablet 2   b complex vitamins capsule Take 1 capsule by mouth daily.     Biotin 1 MG CAPS Take by mouth.     Cholecalciferol (CVS VIT D 5000 HIGH-POTENCY PO) Take 5,000 Units by mouth daily.     EVENING PRIMROSE OIL PO Take by mouth.     fluocinonide (LIDEX) 0.05 % external solution   6   Lactobacillus Casei-Folic Acid (RESTORA RX) 60-1.25 MG CAPS TAKE (1) CAPSULE DAILY. 90 capsule 3   Magnesium 500 MG CAPS Take 500 mg by mouth daily.     methimazole (TAPAZOLE) 5 MG tablet Take 0.5 tablets (2.5 mg total) by mouth daily. 45 tablet 3   Potassium 99 MG TABS Take 99 mg by mouth daily.     Potassium Gluconate 2.5 MEQ TABS Take by mouth.     Theanine 100 MG CAPS Take 100 mg by mouth 2 (two) times daily.     valACYclovir (VALTREX) 500 MG tablet Take 1 tablet (500 mg total) by mouth daily. 90 tablet 1   vitamin C (ASCORBIC ACID) 500 MG tablet Take 500 mg by mouth daily.     No current facility-administered medications on file prior to visit.    Past Medical History:  Diagnosis Date   Acetabular labrum tear    right/ per pt history   Arthritis    Gall bladder disease    per pt report   Ganglion cyst    GERD (gastroesophageal reflux disease)    Hemorrhoids    Low back pain 08/20/2016   Menopause    Osteoporosis    Telogen effluvium    Thyroid disease     Past Surgical History:  Procedure Laterality Date   BREAST SURGERY  2002   cyst on Left breast   CYSTECTOMY  1999   Right elbow   HEMORRHOID BANDING  07/2014   JOINT REPLACEMENT     KNEE ARTHROSCOPY Left 09/10/2013   Procedure: LEFT MEDIAL AND LATERAL KNEE ARTHROSCOPY WITH MENISCAL DEBRIDEMENT ;  Surgeon: Gearlean Alf, MD;  Location: [facility];  Service: Orthopedics;  Laterality: Left;    NASAL SINUS SURGERY  1995, 2002   TOTAL HIP ARTHROPLASTY  09/12/2011   Procedure: TOTAL HIP ARTHROPLASTY ANTERIOR APPROACH;  Surgeon: Mauri Pole, MD;  Location: [facility];  Service: Orthopedics;  Laterality: Left;    Social History   Socioeconomic History   Marital status: Divorced    Spouse name: Not on file   Number of children: Not on file   Years of education: Not on file   Highest education level: Not on file  Occupational History   Occupation: Accountant  Tobacco Use   Smoking status: Never   Smokeless tobacco: Never  Vaping Use   Vaping Use: Never used  Substance and Sexual Activity   Alcohol use: Yes    Alcohol/week: 1.0 standard drink    Types: 1 Standard drinks or equivalent per week    Comment: once weekly or 2-3 drinks per month   Drug use: No   Sexual activity: Not Currently  Other Topics Concern   Not on file  Social History Narrative   Not on file   Social Determinants of Health   Financial Resource Strain: Not on file  Food Insecurity: Not on file  Transportation Needs: Not on file  Physical Activity: Not on file  Stress: Not on file  Social Connections: Not on file    Family History  Problem Relation Age of Onset   Stroke Mother    Nephrolithiasis Mother    Diabetes Mother    Aneurysm Mother        x2   Cancer Mother        lymphoma, breast cancer   Depression Father    Parkinsonism Father    Drug abuse Father    Parkinson's disease Brother    Mental illness Sister        paranoia    Review of Systems  Constitutional:  Positive for diaphoresis (at night only). Negative for chills and fever.  Eyes:  Negative for visual disturbance.  Respiratory:  Negative for cough, shortness of breath and wheezing.   Cardiovascular:  Positive for palpitations (occ). Negative for chest pain and leg swelling.  Gastrointestinal:  Negative for abdominal pain, blood in stool, constipation, diarrhea and nausea.       Occ mucus discharge with blood in it from  rectum,  No gerd  Genitourinary:  Negative for dysuria and hematuria.  Musculoskeletal:  Positive for arthralgias (hip - labrum tear) and back pain (does PT exercises).       Several ganglion cysts  Skin:  Negative for rash.  Neurological:  Negative for light-headedness and headaches.  Psychiatric/Behavioral:  Negative for dysphoric mood.  The patient is nervous/anxious (mild).       Objective:   Vitals:   04/12/21 0814  BP: 116/76  Pulse: 87  Temp: 98.1 F (36.7 C)  SpO2: 97%   Filed Weights   04/12/21 0814  Weight: 140 lb 3.2 oz (63.6 kg)   Body mass index is 21.96 kg/m.  BP Readings from Last 3 Encounters:  04/12/21 116/76  03/24/21 122/82  10/05/20 114/80    Wt Readings from Last 3 Encounters:  04/12/21 140 lb 3.2 oz (63.6 kg)  03/24/21 141 lb (64 kg)  10/05/20 136 lb (61.7 kg)    Depression screen [facility] 2/9 04/12/2021 02/16/2020 02/13/2019 02/11/2018  Decreased Interest 0 0 0 0  Down, Depressed, Hopeless 1 0 0 0  PHQ - 2 Score 1 0 0 0  Altered sleeping 0 - - 0  Tired, decreased energy 1 - - 0  Change in appetite 0 - - 0  Feeling bad or failure about yourself  0 - - 0  Trouble concentrating 0 - - 0  Moving slowly or fidgety/restless 0 - - 0  Suicidal thoughts 0 - - 0  PHQ-9 Score 2 - - 0  Difficult doing work/chores Somewhat difficult - - -     GAD 7 : Generalized Anxiety Score 04/12/2021  Nervous, Anxious, on Edge 0  Control/stop worrying 1  Worry too much - different things 1  Trouble relaxing 0  Restless 0  Easily annoyed or irritable 0  Afraid - awful might happen 0  Total GAD 7 Score 2  Anxiety Difficulty Somewhat difficult       Physical Exam Constitutional: She appears well-developed and well-nourished. No distress.  HENT:  Head: Normocephalic and atraumatic.  Right Ear: External ear normal. Normal ear canal and TM Left Ear: External ear normal.  Normal ear canal and TM Mouth/Throat: Oropharynx is clear and moist.  Eyes: Conjunctivae  and EOM are normal.  Neck: Neck supple. No tracheal deviation present. No thyromegaly present.  No carotid bruit  Cardiovascular: Normal rate, regular rhythm and normal heart sounds.   No murmur heard.  No edema. Pulmonary/Chest: Effort normal and breath sounds normal. No respiratory distress. She has no wheezes. She has no rales.  Breast: deferred   Abdominal: Soft. She exhibits no distension. There is no tenderness.  Lymphadenopathy: She has no cervical adenopathy.  Skin: Skin is warm and dry. She is not diaphoretic.  Psychiatric: She has a normal mood and affect. Her behavior is normal.     Lab Results  Component Value Date   WBC 6.7 10/05/2020   HGB 14.2 10/05/2020   HCT 42.6 10/05/2020   PLT 253.0 10/05/2020   GLUCOSE 89 10/05/2020   CHOL 156 02/16/2020   TRIG 53.0 02/16/2020   HDL 59.90 02/16/2020   LDLCALC 86 02/16/2020   ALT 18 10/05/2020   AST 21 10/05/2020   NA 141 10/05/2020   K 4.3 10/05/2020   CL 103 10/05/2020   CREATININE 0.77 10/05/2020   BUN 17 10/05/2020   CO2 30 10/05/2020   TSH 3.01 10/05/2020   INR 0.98 11/20/2012   HGBA1C 5.9 02/16/2020         Assessment & Plan:   Physical exam: Screening blood work  ordered Exercise   some - not regular Weight  normal Substance abuse  none   Screened for depression using the PHQ 9 scale.  No evidence of depression.   Screened for anxiety using GAD7 Scale.  No evidence of anxiety.  Reviewed recommended immunizations.   Health Maintenance  Topic Date Due   COVID-19 Vaccine (1) Never done   Zoster Vaccines- Shingrix (1 of 2) Never done   MAMMOGRAM  03/11/2021   DEXA SCAN  10/19/2021   COLONOSCOPY (Pts 45-35yrs Insurance coverage will need to be confirmed)  06/15/2024   TETANUS/TDAP  09/19/2027   Pneumonia Vaccine 20+ Years old  Completed   INFLUENZA VACCINE  Completed   Hepatitis C Screening  Completed   HPV VACCINES  Aged Out          See Problem List for Assessment and Plan of  chronic medical problems.

## 2021-04-12 ENCOUNTER — Encounter: Payer: Self-pay | Admitting: Internal Medicine

## 2021-04-12 ENCOUNTER — Other Ambulatory Visit: Payer: Self-pay

## 2021-04-12 ENCOUNTER — Ambulatory Visit (INDEPENDENT_AMBULATORY_CARE_PROVIDER_SITE_OTHER): Payer: BLUE CROSS/BLUE SHIELD | Admitting: Internal Medicine

## 2021-04-12 VITALS — BP 116/76 | HR 87 | Temp 98.1°F | Ht 67.0 in | Wt 140.2 lb

## 2021-04-12 DIAGNOSIS — E0590 Thyrotoxicosis, unspecified without thyrotoxic crisis or storm: Secondary | ICD-10-CM

## 2021-04-12 DIAGNOSIS — M810 Age-related osteoporosis without current pathological fracture: Secondary | ICD-10-CM | POA: Diagnosis not present

## 2021-04-12 DIAGNOSIS — A6004 Herpesviral vulvovaginitis: Secondary | ICD-10-CM

## 2021-04-12 DIAGNOSIS — Z1331 Encounter for screening for depression: Secondary | ICD-10-CM

## 2021-04-12 DIAGNOSIS — Z0000 Encounter for general adult medical examination without abnormal findings: Secondary | ICD-10-CM | POA: Diagnosis not present

## 2021-04-12 DIAGNOSIS — R208 Other disturbances of skin sensation: Secondary | ICD-10-CM

## 2021-04-12 DIAGNOSIS — R7303 Prediabetes: Secondary | ICD-10-CM | POA: Diagnosis not present

## 2021-04-12 LAB — COMPREHENSIVE METABOLIC PANEL
ALT: 16 U/L (ref 0–35)
AST: 20 U/L (ref 0–37)
Albumin: 4.5 g/dL (ref 3.5–5.2)
Alkaline Phosphatase: 51 U/L (ref 39–117)
BUN: 25 mg/dL — ABNORMAL HIGH (ref 6–23)
CO2: 29 mEq/L (ref 19–32)
Calcium: 9.4 mg/dL (ref 8.4–10.5)
Chloride: 106 mEq/L (ref 96–112)
Creatinine, Ser: 0.82 mg/dL (ref 0.40–1.20)
GFR: 73.64 mL/min (ref >60.00)
Glucose, Bld: 92 mg/dL (ref 70–99)
Potassium: 4.8 mEq/L (ref 3.5–5.1)
Sodium: 142 mEq/L (ref 135–145)
Total Bilirubin: 0.6 mg/dL (ref 0.2–1.2)
Total Protein: 7 g/dL (ref 6.0–8.3)

## 2021-04-12 LAB — CBC WITH DIFFERENTIAL/PLATELET
Basophils Absolute: 0 10*3/uL (ref 0.0–0.1)
Basophils Relative: 0.7 % (ref 0.0–3.0)
Eosinophils Absolute: 0.2 10*3/uL (ref 0.0–0.7)
Eosinophils Relative: 3 % (ref 0.0–5.0)
HCT: 42.1 % (ref 36.0–46.0)
Hemoglobin: 13.8 g/dL (ref 12.0–15.0)
Lymphocytes Relative: 19.7 % (ref 12.0–46.0)
Lymphs Abs: 1.2 10*3/uL (ref 0.7–4.0)
MCHC: 32.8 g/dL (ref 30.0–36.0)
MCV: 87.8 fl (ref 78.0–100.0)
Monocytes Absolute: 0.4 10*3/uL (ref 0.1–1.0)
Monocytes Relative: 6.8 % (ref 3.0–12.0)
Neutro Abs: 4.3 10*3/uL (ref 1.4–7.7)
Neutrophils Relative %: 69.8 % (ref 43.0–77.0)
Platelets: 228 10*3/uL (ref 150.0–400.0)
RBC: 4.8 Mil/uL (ref 3.87–5.11)
RDW: 13.3 % (ref 11.5–15.5)
WBC: 6.1 10*3/uL (ref 4.0–10.5)

## 2021-04-12 LAB — LIPID PANEL
Cholesterol: 170 mg/dL (ref 0–200)
HDL: 63.1 mg/dL (ref >39.00)
LDL Cholesterol: 96 mg/dL (ref 0–99)
NonHDL: 107.04
Total CHOL/HDL Ratio: 3
Triglycerides: 57 mg/dL (ref 0.0–149.0)
VLDL: 11.4 mg/dL (ref 0.0–40.0)

## 2021-04-12 LAB — VITAMIN D 25 HYDROXY (VIT D DEFICIENCY, FRACTURES): VITD: 58.19 ng/mL (ref 30.00–100.00)

## 2021-04-12 LAB — T3, FREE: T3, Free: 3.2 pg/mL (ref 2.3–4.2)

## 2021-04-12 LAB — TSH: TSH: 1.37 u[IU]/mL (ref 0.35–5.50)

## 2021-04-12 LAB — T4, FREE: Free T4: 0.66 ng/dL (ref 0.60–1.60)

## 2021-04-12 LAB — HEMOGLOBIN A1C: Hgb A1c MFr Bld: 6 % (ref 4.6–6.5)

## 2021-04-12 MED ORDER — AMITRIPTYLINE HCL 10 MG PO TABS
10.0000 mg | ORAL_TABLET | Freq: Every day | ORAL | 2 refills | Status: DC
Start: 2021-04-12 — End: 2022-02-06

## 2021-04-12 MED ORDER — METHIMAZOLE 5 MG PO TABS: 2.5000 mg | ORAL_TABLET | Freq: Every day | ORAL | 3 refills | Status: DC

## 2021-04-12 MED ORDER — RESTORA RX 60-1.25 MG PO CAPS: ORAL_CAPSULE | ORAL | 3 refills | Status: DC

## 2021-04-12 NOTE — Assessment & Plan Note (Signed)
Chronic Check a1c Low sugar / carb diet Stressed regular exercise  

## 2021-04-12 NOTE — Assessment & Plan Note (Signed)
Chronic Continue valtrex 500 mg daily

## 2021-04-12 NOTE — Assessment & Plan Note (Signed)
Chronic dexa up to date On fosamax x 5 years - will stop now and monitor Taking calcium and vitamin d Stressed regular exercise Check vitamin d level

## 2021-04-12 NOTE — Assessment & Plan Note (Signed)
Chronic  Clinically euthyroid Currently taking methimazole 2.5 mg daily Check tsh, ft4, ft3  Titrate med dose if needed

## 2021-04-12 NOTE — Assessment & Plan Note (Signed)
Chronic Continue amitriptyline 10 mg HS

## 2021-04-24 DIAGNOSIS — C73 Malignant neoplasm of thyroid gland: Secondary | ICD-10-CM

## 2021-04-24 HISTORY — DX: Malignant neoplasm of thyroid gland: C73

## 2021-10-03 ENCOUNTER — Encounter: Payer: Self-pay | Admitting: Internal Medicine

## 2021-10-03 DIAGNOSIS — M810 Age-related osteoporosis without current pathological fracture: Secondary | ICD-10-CM

## 2021-10-09 ENCOUNTER — Encounter: Payer: Self-pay | Admitting: Internal Medicine

## 2021-10-10 MED ORDER — VALACYCLOVIR HCL 500 MG PO TABS: 500.0000 mg | ORAL_TABLET | Freq: Every day | ORAL | 1 refills | Status: DC

## 2021-10-17 ENCOUNTER — Other Ambulatory Visit: Payer: Self-pay | Admitting: Otolaryngology

## 2021-11-15 ENCOUNTER — Encounter: Payer: Self-pay | Admitting: Internal Medicine

## 2021-11-15 LAB — HM MAMMOGRAPHY

## 2021-11-29 ENCOUNTER — Encounter: Payer: Self-pay | Admitting: Internal Medicine

## 2021-12-07 ENCOUNTER — Encounter: Payer: Self-pay | Admitting: Internal Medicine

## 2021-12-07 NOTE — Progress Notes (Signed)
Outside notes received. Information abstracted. Notes sent to scan.  

## 2021-12-15 ENCOUNTER — Encounter: Payer: Self-pay | Admitting: Internal Medicine

## 2021-12-15 DIAGNOSIS — I720 Aneurysm of carotid artery: Secondary | ICD-10-CM | POA: Insufficient documentation

## 2021-12-15 DIAGNOSIS — I7121 Aneurysm of the ascending aorta, without rupture: Secondary | ICD-10-CM | POA: Insufficient documentation

## 2021-12-15 DIAGNOSIS — C73 Malignant neoplasm of thyroid gland: Secondary | ICD-10-CM | POA: Insufficient documentation

## 2021-12-15 NOTE — Patient Instructions (Addendum)
      A referral was ordered for [facility].     Someone from that office will call you to schedule an appointment.

## 2021-12-15 NOTE — Progress Notes (Unsigned)
Subjective:    Patient ID: Patricia Conley, female    DOB: 1950/06/25, 71 y.o.   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     She has seen Dr Scarlette Slice at [facility] surgery and saw Dr Sarajane Jews and [place] and will have a total thyroidectomy and b/l central neck dissection.  Will have surgery Nov 14th.     Imaging work up for the above revealed   11/03/2021 - CT chest w/ contrast: 1. No evidence of metastatic disease in the chest.  2. Mild fusiform aneurysmal dilatation of the ascending aorta,  maximal dimension 4.1 cm. Recommend annual imaging followup by CTA  or MRA. This recommendation follows 2010  ACCF/AHA/AATS/ACR/ASA/SCA/SCAI/SIR/STS/SVM Guidelines for the  Diagnosis and Management of Patients with Thoracic Aortic Disease.  Circulation. 2010; 121: G269-S854. Aortic aneurysm NOS (ICD10-I71.9)    11/03/2021: Ct neck/thyroid w/ contrast: 1. Unchanged appearance of heterogeneous nodular focus in the left  lobe of the thyroid gland.  2. No cervical lymphadenopathy.  3. 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    Medications and allergies reviewed with patient and updated if appropriate.  Current Outpatient Medications on File Prior to Visit  Medication Sig Dispense Refill   amitriptyline (ELAVIL) 10 MG tablet Take 1 tablet (10 mg total) by mouth at bedtime. 90 tablet 2   b complex vitamins capsule Take 1 capsule by mouth daily.     Biotin 1 MG CAPS Take by mouth.     Cholecalciferol (CVS VIT D 5000 HIGH-POTENCY PO) Take 5,000 Units by mouth daily.     EVENING PRIMROSE OIL PO Take by mouth.     fluocinonide (LIDEX) 0.05 % external solution   6   Lactobacillus Casei-Folic Acid (RESTORA RX) 60-1.25 MG  CAPS TAKE (1) CAPSULE DAILY. 90 capsule 3   Magnesium 500 MG CAPS Take 500 mg by mouth daily.     methimazole (TAPAZOLE) 5 MG tablet Take 0.5 tablets (2.5 mg total) by mouth daily. 45 tablet 3   Potassium 99 MG TABS Take 99 mg by mouth daily.     Potassium Gluconate 2.5 MEQ TABS Take by mouth.     Theanine 100 MG CAPS Take 100 mg by mouth 2 (two) times daily.     valACYclovir (VALTREX) 500 MG tablet Take 1 tablet (500 mg total) by mouth daily. 90 tablet 1   vitamin C (ASCORBIC ACID) 500 MG tablet Take 500 mg by mouth daily.     No current facility-administered medications on file prior to visit.    Review of Systems     Objective:  There were no vitals filed for this visit. BP Readings from Last 3 Encounters:  04/12/21 116/76  03/24/21 122/82  10/05/20 114/80   Wt Readings from Last 3 Encounters:  04/12/21 140 lb 3.2 oz (63.6 kg)  03/24/21 141 lb (64 kg)  10/05/20 136 lb (61.7 kg)   There is no height or weight on file to calculate BMI.    Physical Exam         Assessment & Plan:    See Problem List for Assessment and Plan of chronic medical  problems.

## 2021-12-16 ENCOUNTER — Ambulatory Visit: Payer: BLUE CROSS/BLUE SHIELD | Admitting: Internal Medicine

## 2021-12-16 VITALS — BP 116/78 | HR 70 | Temp 98.0°F | Ht 67.0 in | Wt 141.6 lb

## 2021-12-16 DIAGNOSIS — I720 Aneurysm of carotid artery: Secondary | ICD-10-CM

## 2021-12-16 DIAGNOSIS — C73 Malignant neoplasm of thyroid gland: Secondary | ICD-10-CM

## 2021-12-16 DIAGNOSIS — I7121 Aneurysm of the ascending aorta, without rupture: Secondary | ICD-10-CM | POA: Diagnosis not present

## 2021-12-17 NOTE — Assessment & Plan Note (Signed)
Seen on imaging for w/u of papillary thyroid cancer Reviewed - will refer to vascular surgery for them to monitor

## 2021-12-18 ENCOUNTER — Encounter: Payer: Self-pay | Admitting: Internal Medicine

## 2021-12-24 ENCOUNTER — Other Ambulatory Visit: Payer: Self-pay | Admitting: Internal Medicine

## 2021-12-24 ENCOUNTER — Encounter: Payer: Self-pay | Admitting: Internal Medicine

## 2021-12-24 DIAGNOSIS — I7121 Aneurysm of the ascending aorta, without rupture: Secondary | ICD-10-CM

## 2021-12-28 ENCOUNTER — Encounter: Payer: Self-pay | Admitting: *Deleted

## 2022-01-23 NOTE — Progress Notes (Unsigned)
      [address]       [place], [address]             [phone]        Patricia Conley [id number] [birth year]   History of Present Illness:  Patricia Conley is a 71 yo female with history of Thyroid disease.  She has recently diagnosed with Thyroid Cancer.  During workup and evaluation they performed a CT scan of her chest to evaluate for metastatic disease.  She was incidentally found to have a 4.1 cm fusiform ascending aortic aneurysm.  She was referred for surgical evaluation/surveillance.      Current Outpatient Medications on File Prior to Visit  Medication Sig Dispense Refill   amitriptyline (ELAVIL) 10 MG tablet Take 1 tablet (10 mg total) by mouth at bedtime. 90 tablet 2   b complex vitamins capsule Take 1 capsule by mouth daily.     Biotin 1 MG CAPS Take by mouth.     Cholecalciferol (CVS VIT D 5000 HIGH-POTENCY PO) Take 5,000 Units by mouth daily.     EVENING PRIMROSE OIL PO Take by mouth.     fluocinonide (LIDEX) 0.05 % external solution   6   Lactobacillus Casei-Folic Acid (RESTORA RX) 60-1.25 MG CAPS TAKE (1) CAPSULE DAILY. 90 capsule 3   Magnesium 500 MG CAPS Take 500 mg by mouth daily.     methimazole (TAPAZOLE) 5 MG tablet Take 0.5 tablets (2.5 mg total) by mouth daily. 45 tablet 3   Potassium 99 MG TABS Take 99 mg by mouth daily.     Potassium Gluconate 2.5 MEQ TABS Take by mouth.     Theanine 100 MG CAPS Take 100 mg by mouth 2 (two) times daily.     valACYclovir (VALTREX) 500 MG tablet Take 1 tablet (500 mg total) by mouth daily. 90 tablet 1   vitamin C (ASCORBIC ACID) 500 MG tablet Take 500 mg by mouth daily.     No current facility-administered medications on file prior to visit.     There were no vitals taken for this visit.  Physical Exam  CTA Results: Outside films reviewed with 4.1 cm fusiform ascending aortic aneurysm      A/P:  Fusiform Ascending Aortic Aneurysm measuring 4.1 cm, incidentally found on workup for  Thyroid cancer     Risk Modification:  Statin:  ***  Smoking cessation instruction/counseling given:  {CHL AMB [facility] SMOKING CESSATION COUNSELING:20758}  Patient was counseled on importance of Blood Pressure Control.  Despite Medical intervention if the patient notices persistently elevated blood pressure readings.  They are instructed to contact their Primary Care Physician  Please avoid use of Fluoroquinolones as this can potentially increase your risk of Aortic Rupture and/or Dissection  Patient educated on signs and symptoms of Aortic Dissection, handout also provided in AVS  Samari Bittinger, PA-C 01/23/22

## 2022-01-23 NOTE — Patient Instructions (Signed)
Make every effort to maintain a "heart-healthy" lifestyle with regular physical exercise and adherence to a low-fat, low-carbohydrate diet.  Continue to seek regular follow-up appointments with your primary care physician and/or cardiologist.   AVOID FLOUROQUINOLONES (ex: Cipro) this class of Antibiotics can increase you risk of Aortic Dissection

## 2022-01-24 ENCOUNTER — Encounter: Payer: BLUE CROSS/BLUE SHIELD | Admitting: Vascular Surgery

## 2022-01-24 ENCOUNTER — Encounter: Payer: Self-pay | Admitting: Internal Medicine

## 2022-01-24 NOTE — Progress Notes (Unsigned)
    Subjective:    Patient ID: Patricia Conley, female    DOB: 09-Jul-1950, 71 y.o.   MRN: [id number]      HPI Patricia Conley is here for No chief complaint on file.        Medications and allergies reviewed with patient and updated if appropriate.  Current Outpatient Medications on File Prior to Visit  Medication Sig Dispense Refill   amitriptyline (ELAVIL) 10 MG tablet Take 1 tablet (10 mg total) by mouth at bedtime. 90 tablet 2   b complex vitamins capsule Take 1 capsule by mouth daily.     Biotin 1 MG CAPS Take by mouth.     Cholecalciferol (CVS VIT D 5000 HIGH-POTENCY PO) Take 5,000 Units by mouth daily.     EVENING PRIMROSE OIL PO Take by mouth.     fluocinonide (LIDEX) 0.05 % external solution   6   Lactobacillus Casei-Folic Acid (RESTORA RX) 60-1.25 MG CAPS TAKE (1) CAPSULE DAILY. 90 capsule 3   Magnesium 500 MG CAPS Take 500 mg by mouth daily.     methimazole (TAPAZOLE) 5 MG tablet Take 0.5 tablets (2.5 mg total) by mouth daily. 45 tablet 3   Potassium 99 MG TABS Take 99 mg by mouth daily.     Potassium Gluconate 2.5 MEQ TABS Take by mouth.     Theanine 100 MG CAPS Take 100 mg by mouth 2 (two) times daily.     valACYclovir (VALTREX) 500 MG tablet Take 1 tablet (500 mg total) by mouth daily. 90 tablet 1   vitamin C (ASCORBIC ACID) 500 MG tablet Take 500 mg by mouth daily.     No current facility-administered medications on file prior to visit.    Review of Systems     Objective:  There were no vitals filed for this visit. BP Readings from Last 3 Encounters:  12/16/21 116/78  04/12/21 116/76  03/24/21 122/82   Wt Readings from Last 3 Encounters:  12/16/21 141 lb 9.6 oz (64.2 kg)  04/12/21 140 lb 3.2 oz (63.6 kg)  03/24/21 141 lb (64 kg)   There is no height or weight on file to calculate BMI.    Physical Exam         Assessment & Plan:    See Problem List for Assessment and Plan of chronic medical problems.

## 2022-01-25 ENCOUNTER — Encounter: Payer: BLUE CROSS/BLUE SHIELD | Admitting: Vascular Surgery

## 2022-01-25 ENCOUNTER — Ambulatory Visit (INDEPENDENT_AMBULATORY_CARE_PROVIDER_SITE_OTHER): Payer: Medicare Other | Admitting: Internal Medicine

## 2022-01-25 VITALS — BP 106/72 | HR 79 | Temp 98.3°F | Ht 67.0 in | Wt 141.0 lb

## 2022-01-25 DIAGNOSIS — M2550 Pain in unspecified joint: Secondary | ICD-10-CM

## 2022-01-25 DIAGNOSIS — R5383 Other fatigue: Secondary | ICD-10-CM

## 2022-01-25 DIAGNOSIS — R208 Other disturbances of skin sensation: Secondary | ICD-10-CM

## 2022-01-25 LAB — COMPREHENSIVE METABOLIC PANEL
ALT: 19 U/L (ref 0–35)
AST: 23 U/L (ref 0–37)
Albumin: 4.4 g/dL (ref 3.5–5.2)
Alkaline Phosphatase: 54 U/L (ref 39–117)
BUN: 17 mg/dL (ref 6–23)
CO2: 28 mEq/L (ref 19–32)
Calcium: 9.3 mg/dL (ref 8.4–10.5)
Chloride: 107 mEq/L (ref 96–112)
Creatinine, Ser: 0.74 mg/dL (ref 0.40–1.20)
GFR: 82.84 mL/min (ref >60.00)
Glucose, Bld: 70 mg/dL (ref 70–99)
Potassium: 4.6 mEq/L (ref 3.5–5.1)
Sodium: 142 mEq/L (ref 135–145)
Total Bilirubin: 0.6 mg/dL (ref 0.2–1.2)
Total Protein: 6.6 g/dL (ref 6.0–8.3)

## 2022-01-25 LAB — CBC WITH DIFFERENTIAL/PLATELET
Basophils Absolute: 0 10*3/uL (ref 0.0–0.1)
Basophils Relative: 1 % (ref 0.0–3.0)
Eosinophils Absolute: 0.1 10*3/uL (ref 0.0–0.7)
Eosinophils Relative: 2 % (ref 0.0–5.0)
HCT: 39.6 % (ref 36.0–46.0)
Hemoglobin: 13.1 g/dL (ref 12.0–15.0)
Lymphocytes Relative: 23.7 % (ref 12.0–46.0)
Lymphs Abs: 1.2 10*3/uL (ref 0.7–4.0)
MCHC: 33.1 g/dL (ref 30.0–36.0)
MCV: 88.6 fl (ref 78.0–100.0)
Monocytes Absolute: 0.4 10*3/uL (ref 0.1–1.0)
Monocytes Relative: 8 % (ref 3.0–12.0)
Neutro Abs: 3.2 10*3/uL (ref 1.4–7.7)
Neutrophils Relative %: 65.3 % (ref 43.0–77.0)
Platelets: 213 10*3/uL (ref 150.0–400.0)
RBC: 4.47 Mil/uL (ref 3.87–5.11)
RDW: 13.5 % (ref 11.5–15.5)
WBC: 4.9 10*3/uL (ref 4.0–10.5)

## 2022-01-25 LAB — SEDIMENTATION RATE: Sed Rate: 3 mm/hr (ref 0–30)

## 2022-01-25 LAB — C-REACTIVE PROTEIN: CRP: <1 mg/dL (ref 0.5–20.0)

## 2022-01-25 NOTE — Assessment & Plan Note (Signed)
Acute Started recently in addition to joint pain that is diffuse She was in a wooded area and was concerned about the possibility of Lyme-no confirmed tick bite, but Lyme is a possibility Rule out Lyme with blood work today Also possible autoimmune or reactive arthritis We will also check some autoimmune blood work-CBC, CMP, ANA, ESR, CRP, CCP and RF Symptomatic treatment Further management based on blood work results

## 2022-01-25 NOTE — Assessment & Plan Note (Signed)
Acute on chronic Has osteoarthritis, but her current joint pain is much different than her chronic joint pain-every joint hurts-even her toes Joint pain is also associated with increased fatigue ?  Lyme disease-does not have a confirmed tick bite, but was in a wooded area a week or so before symptoms started We will check for Lyme disease ?  Reactive arthritis-she does recall having a sore throat few days before her symptoms started and this could be more of a reactive arthritis Symptomatic treatment at this point In addition to lyme Check ANA, CRP, CCP, RF, ESR

## 2022-01-25 NOTE — Patient Instructions (Addendum)
     Blood work was ordered.      Medications changes include :   none     Return if symptoms worsen or fail to improve.  

## 2022-01-25 NOTE — Assessment & Plan Note (Signed)
Chronic Related to allergies, stress and environmental factors Continue amitriptyline 10 mg at bedtime Also does help with sleep

## 2022-01-26 ENCOUNTER — Institutional Professional Consult (permissible substitution) (INDEPENDENT_AMBULATORY_CARE_PROVIDER_SITE_OTHER): Payer: Medicare Other | Admitting: Physician Assistant

## 2022-01-26 VITALS — BP 142/84 | HR 91 | Resp 20 | Ht 67.0 in | Wt 140.0 lb

## 2022-01-26 DIAGNOSIS — I7121 Aneurysm of the ascending aorta, without rupture: Secondary | ICD-10-CM | POA: Diagnosis not present

## 2022-01-26 DIAGNOSIS — Q796 Ehlers-Danlos syndrome, unspecified: Secondary | ICD-10-CM | POA: Insufficient documentation

## 2022-01-27 ENCOUNTER — Encounter: Payer: Self-pay | Admitting: Internal Medicine

## 2022-01-27 LAB — B. BURGDORFI ANTIBODIES BY WB

## 2022-01-27 LAB — ANTI-NUCLEAR AB-TITER (ANA TITER): ANA Titer 1: 1:40 {titer} — ABNORMAL HIGH

## 2022-01-27 LAB — CYCLIC CITRUL PEPTIDE ANTIBODY, IGG: Cyclic Citrullin Peptide Ab: <16 UNITS

## 2022-01-27 LAB — RHEUMATOID FACTOR: Rheumatoid fact SerPl-aCnc: <14 IU/mL (ref <14)

## 2022-01-27 LAB — ANA: Anti Nuclear Antibody (ANA): POSITIVE — AB

## 2022-02-04 ENCOUNTER — Other Ambulatory Visit: Payer: Self-pay | Admitting: Internal Medicine

## 2022-02-04 DIAGNOSIS — R208 Other disturbances of skin sensation: Secondary | ICD-10-CM

## 2022-02-06 ENCOUNTER — Encounter: Payer: Self-pay | Admitting: Internal Medicine

## 2022-02-06 DIAGNOSIS — R208 Other disturbances of skin sensation: Secondary | ICD-10-CM

## 2022-02-08 MED ORDER — AMITRIPTYLINE HCL 10 MG PO TABS: 10.0000 mg | ORAL_TABLET | Freq: Every day | ORAL | 3 refills | Status: DC

## 2022-02-22 HISTORY — PX: THYROIDECTOMY: SHX17

## 2022-02-28 NOTE — Addendum Note (Signed)
Addended by: Binnie Rail on: 02/28/2022 08:21 PM   Modules accepted: Orders

## 2022-03-01 MED ORDER — AMITRIPTYLINE HCL 10 MG PO TABS: 10.0000 mg | ORAL_TABLET | Freq: Every day | ORAL | 3 refills | Status: DC

## 2022-03-09 MED ORDER — AMITRIPTYLINE HCL 10 MG PO TABS: 10.0000 mg | ORAL_TABLET | Freq: Every day | ORAL | 3 refills | Status: DC

## 2022-03-09 NOTE — Addendum Note (Signed)
Addended by: Binnie Rail on: 03/09/2022 05:35 PM   Modules accepted: Orders

## 2022-03-10 ENCOUNTER — Telehealth: Payer: Self-pay

## 2022-03-10 NOTE — Telephone Encounter (Signed)
error 

## 2022-03-10 NOTE — Telephone Encounter (Signed)
Transition Care Management Follow-up Telephone Call Date of discharge and from where: [facility] ; November 15,2023 How have you been since you were released from the hospital? Sore but recuperating okay.  Any questions or concerns? No  Items Reviewed: Did the pt receive and understand the discharge instructions provided? Yes  Medications obtained and verified? Yes  Other? Yes  Any new allergies since your discharge? No  Dietary orders reviewed? No Do you have support at home? Yes   Home Care and Equipment/Supplies: Were home health services ordered? no If so, what is the name of the agency?   Has the agency set up a time to come to the patient's home? not applicable Were any new equipment or medical supplies ordered?  No What is the name of the medical supply agency? N/A Were you able to get the supplies/equipment? not applicable Do you have any questions related to the use of the equipment or supplies? No  Functional Questionnaire: (I = Independent and D = Dependent) ADLs: I  Bathing/Dressing- I  Meal Prep- I  Eating- I  Maintaining continence- I  Transferring/Ambulation- I  Managing Meds- I  Follow up appointments reviewed:  PCP Hospital f/u appt confirmed? Yes  Scheduled to see Dr. Quay Burow on November 30 @ 9:30 am. [facility] f/u appt confirmed? Yes  Scheduled to see Surgeon on November 28th.  Are transportation arrangements needed? No  If their condition worsens, is the pt aware to call PCP or go to the Emergency Dept.? Yes Was the patient provided with contact information for the PCP's office or ED? Yes Was to pt encouraged to call back with questions or concerns? Yes

## 2022-03-22 ENCOUNTER — Encounter: Payer: Self-pay | Admitting: Internal Medicine

## 2022-03-22 NOTE — Progress Notes (Unsigned)
      Subjective:    Patient ID: Patricia Conley, female    DOB: [birth year], 71 y.o.   MRN: [id number]     HPI [name] is here for follow up from the hospital.   Admitted at [facility] for thyroidectomy and LN dissection for thyroid cancer.  There was evidence of metastatic disease in the right cervical nodes.      Medications and allergies reviewed with patient and updated if appropriate.  Current Outpatient Medications on File Prior to Visit  Medication Sig Dispense Refill   amitriptyline (ELAVIL) 10 MG tablet Take 1 tablet (10 mg total) by mouth at bedtime. 90 tablet 3   b complex vitamins capsule Take 1 capsule by mouth daily.     Biotin 1 MG CAPS Take by mouth.     Cholecalciferol (CVS VIT D 5000 HIGH-POTENCY PO) Take 5,000 Units by mouth daily.     EVENING PRIMROSE OIL PO Take by mouth.     Lactobacillus Casei-Folic Acid (RESTORA RX) 60-1.25 MG CAPS TAKE (1) CAPSULE DAILY. 90 capsule 3   Magnesium 500 MG CAPS Take 500 mg by mouth daily.     methimazole (TAPAZOLE) 5 MG tablet Take 0.5 tablets (2.5 mg total) by mouth daily. 45 tablet 3   valACYclovir (VALTREX) 500 MG tablet Take 1 tablet (500 mg total) by mouth daily. 90 tablet 1   vitamin C (ASCORBIC ACID) 500 MG tablet Take 500 mg by mouth daily.     No current facility-administered medications on file prior to visit.     Review of Systems     Objective:  There were no vitals filed for this visit. BP Readings from Last 3 Encounters:  01/26/22 (!) 142/84  01/25/22 106/72  12/16/21 116/78   Wt Readings from Last 3 Encounters:  01/26/22 140 lb (63.5 kg)  01/25/22 141 lb (64 kg)  12/16/21 141 lb 9.6 oz (64.2 kg)   There is no height or weight on file to calculate BMI.    Physical Exam     Lab Results  Component Value Date   WBC 4.9 01/25/2022   HGB 13.1 01/25/2022   HCT 39.6 01/25/2022   PLT 213.0 01/25/2022   GLUCOSE 70 01/25/2022   CHOL 170 04/12/2021   TRIG 57.0 04/12/2021   HDL 63.10 04/12/2021    LDLCALC 96 04/12/2021   ALT 19 01/25/2022   AST 23 01/25/2022   NA 142 01/25/2022   K 4.6 01/25/2022   CL 107 01/25/2022   CREATININE 0.74 01/25/2022   BUN 17 01/25/2022   CO2 28 01/25/2022   TSH 1.37 04/12/2021   INR 0.98 11/20/2012   HGBA1C 6.0 04/12/2021     Assessment & Plan:    See Problem List for Assessment and Plan of chronic medical problems.

## 2022-03-23 ENCOUNTER — Ambulatory Visit (INDEPENDENT_AMBULATORY_CARE_PROVIDER_SITE_OTHER): Payer: Medicare Other | Admitting: Internal Medicine

## 2022-03-23 VITALS — BP 104/78 | HR 75 | Temp 98.1°F | Ht 67.0 in | Wt 141.0 lb

## 2022-03-23 DIAGNOSIS — C73 Malignant neoplasm of thyroid gland: Secondary | ICD-10-CM | POA: Diagnosis not present

## 2022-03-23 DIAGNOSIS — I720 Aneurysm of carotid artery: Secondary | ICD-10-CM

## 2022-03-23 DIAGNOSIS — R208 Other disturbances of skin sensation: Secondary | ICD-10-CM | POA: Diagnosis not present

## 2022-03-23 DIAGNOSIS — I7121 Aneurysm of the ascending aorta, without rupture: Secondary | ICD-10-CM | POA: Diagnosis not present

## 2022-03-23 DIAGNOSIS — E890 Postprocedural hypothyroidism: Secondary | ICD-10-CM | POA: Diagnosis not present

## 2022-03-23 NOTE — Patient Instructions (Signed)
    Medications changes include :   none    

## 2022-03-23 NOTE — Assessment & Plan Note (Signed)
S/p total thyroidectomy secondary to papillary thyroid carcinoma Levothyroxine currently dosed by surgery

## 2022-03-23 NOTE — Assessment & Plan Note (Signed)
Chronic Following with vascular surgery

## 2022-03-23 NOTE — Assessment & Plan Note (Signed)
S/p total thyroidectomy and lymph node dissection-6/24 right cervical nodes were positive To see Endo-oncologist at [facility] and will undergo radioactive iodine treatment

## 2022-03-23 NOTE — Assessment & Plan Note (Signed)
Chronic Not currently taking the amitriptyline-can restart if needed Currently sleeping well Burning related to allergies, stress and environmental factors

## 2022-03-23 NOTE — Assessment & Plan Note (Signed)
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

## 2022-03-29 ENCOUNTER — Encounter: Payer: Self-pay | Admitting: Internal Medicine

## 2022-03-29 ENCOUNTER — Other Ambulatory Visit: Payer: Self-pay

## 2022-03-29 MED ORDER — VALACYCLOVIR HCL 500 MG PO TABS: 500.0000 mg | ORAL_TABLET | Freq: Every day | ORAL | 1 refills | Status: DC

## 2022-04-06 ENCOUNTER — Other Ambulatory Visit: Payer: Self-pay | Admitting: Surgery

## 2022-04-06 DIAGNOSIS — I7121 Aneurysm of the ascending aorta, without rupture: Secondary | ICD-10-CM

## 2022-04-27 ENCOUNTER — Telehealth: Payer: Self-pay

## 2022-04-27 NOTE — Telephone Encounter (Signed)
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 She states that she is having a "full body scan" with contrast in April 2024 and she did not want to have back-to-back scans that required contrast. She requested to have her appointment with Dr. Cyndia Bent come after her scan and to cancel her upcoming appointment with him along with her CT scheduled for this month. Appointments have been cancelled. Patient is advised that she needs to have her scans put on a disk and brought with her to her appointment as we cannot see their imaging. She acknowledged receipt. She states that she is going to call our office back in April after her scans to make a follow-up appointment to see Dr. Cyndia Bent.

## 2022-05-17 ENCOUNTER — Ambulatory Visit: Payer: Medicare Other | Admitting: Surgery

## 2022-05-17 ENCOUNTER — Other Ambulatory Visit: Payer: Medicare Other

## 2022-05-17 LAB — HM MAMMOGRAPHY

## 2022-05-18 ENCOUNTER — Encounter: Payer: Self-pay | Admitting: Internal Medicine

## 2022-05-18 NOTE — Progress Notes (Signed)
Outside notes received. Information abstracted. Notes sent to scan. 

## 2022-05-22 ENCOUNTER — Encounter: Payer: Self-pay | Admitting: Internal Medicine

## 2022-05-22 DIAGNOSIS — K921 Melena: Secondary | ICD-10-CM

## 2022-06-04 NOTE — Addendum Note (Signed)
Addended by: Binnie Rail on: 06/04/2022 03:52 PM   Modules accepted: Orders

## 2022-06-05 ENCOUNTER — Ambulatory Visit (INDEPENDENT_AMBULATORY_CARE_PROVIDER_SITE_OTHER): Payer: Medicare Other | Admitting: Pulmonary Disease

## 2022-06-05 ENCOUNTER — Encounter (HOSPITAL_BASED_OUTPATIENT_CLINIC_OR_DEPARTMENT_OTHER): Payer: Self-pay | Admitting: Pulmonary Disease

## 2022-06-05 VITALS — BP 110/70 | HR 85 | Temp 98.4°F | Ht 67.0 in | Wt 142.2 lb

## 2022-06-05 DIAGNOSIS — G4733 Obstructive sleep apnea (adult) (pediatric): Secondary | ICD-10-CM

## 2022-06-05 NOTE — Patient Instructions (Signed)
Will arrange for a home sleep study Will call to arrange for follow up after sleep study reviewed

## 2022-06-05 NOTE — Progress Notes (Signed)
[facility]  Chief Complaint  Patient presents with   Consult    Doesn't sleep well    Past Surgical History:  She  has a past surgical history that includes Nasal sinus surgery (1995, 2002); Cystectomy (1999); Breast surgery (2002); Total hip arthroplasty (09/12/2011); Joint replacement; Knee arthroscopy (Left, 09/10/2013); and Hemorrhoid banding (07/2014).  Past Medical History:  OA, GERD, Back pain, Osteoporosis, Thyroid cancer with post treatment hypothyroidism  Constitutional:  BP 110/70 (BP Location: Left Arm, Cuff Size: Normal)   Pulse 85   Temp 98.4 F (36.9 C) (Oral)   Ht 5' 7"$  (1.702 m)   Wt 142 lb 3.2 oz (64.5 kg)   SpO2 97%   BMI 22.27 kg/m   Brief Summary:  Patricia Conley is a 72 y.o. female with obstructive sleep apnea. She has hx of TMJ.       Subjective:   I last saw her in July 2019.  At the time she was going to pursue an oral appliance.  This wasn't covered by insurance at the time.  She still uses a mouthguard for TMJ.  She was found to have thyroid cancer last year and had thyroid surgery.  She was started on thyroid replacement.  Shortly after this she noticed her sleep getting worse.  Her daughter to her that she snores and tosses while asleep.  She has to sleep with her neck extended and has tried sleeping on a wedge.  She is getting bags under her eyes.  She feels tired all the time now, and never feels like she slept.  She goes to sleep between 10 and 11 pm.  She falls asleep in 5 to 10.  She wakes up a few times to use the bathroom.  She gets out of bed between 730 and 8 am.  She feels tired in the morning.  She denies morning headache.  She does not use anything to help her stay awake.  She takes melatonin and magnesium at night to help fall asleep and help with leg cramps.  She denies sleep walking, sleep talking, bruxism, or nightmares.  There is no history of restless legs.  She denies sleep  hallucinations, sleep paralysis, or cataplexy.  The Epworth score is 12 out of 24.   Physical Exam:   Appearance - well kempt   ENMT - no sinus tenderness, no oral exudate, no LAN, Mallampati 3 airway, no stridor, over-bite, grinding with jaw opening at TMJ  Respiratory - equal breath sounds bilaterally, no wheezing or rales  CV - s1s2 regular rate and rhythm, no murmurs  Ext - no clubbing, no edema  Skin - no rashes  Psych - normal mood and affect   Sleep Tests:  PSG 12/26/15 >> AHI 0.7, SaO2 low 89%  HST 09/19/17 >> AHI 13.4, SpO2 low 86%  Cardiac Tests:  Echo 10/22/14 >> grade 2 DD, mod TR  Social History:  She  reports that she has never smoked. She has never used smokeless tobacco. She reports current alcohol use of about 1.0 standard drink of alcohol per week. She reports that she does not use drugs.  Family History:  Her family history includes Aneurysm in her mother; Cancer in her mother; Depression in her father; Diabetes in her mother; Drug abuse in her father; Mental illness in her sister; Nephrolithiasis in her mother; Parkinson's disease in her brother; Parkinsonism in her father; Stroke in her mother.    Discussion:  She has  history of obstructive sleep apnea and TMJ.  Her sleep symptoms seemed to have progressed since she had thyroid surgery.  She has gained weight since I saw her previously in 2019.  I am concerned she could still have significant obstructive sleep apnea.  Assessment/Plan:   Snoring with excessive daytime sleepiness. 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 - followed by endocrinology and oncology at [facility]  Multiple aneurysms of ICA, Rt P1, ascending aorta. - seen by genetics at [facility] to assess for Fredderick Phenix Danlos or Marfan syndrome  Obesity. - discussed how weight can impact sleep and risk for sleep disordered breathing - discussed options to assist with weight  loss: combination of diet modification, cardiovascular and strength training exercises  Cardiovascular risk. - had an extensive discussion regarding the adverse health consequences related to untreated sleep disordered breathing - specifically discussed the risks for hypertension, coronary artery disease, cardiac dysrhythmias, cerebrovascular disease, and diabetes - lifestyle modification discussed  Safe driving practices. - discussed how sleep disruption can increase risk of accidents, particularly when driving - safe driving practices were discussed  Therapies for obstructive sleep apnea. - if the sleep study shows significant sleep apnea, then various therapies for treatment were reviewed: CPAP, oral appliance, and surgical interventions  Time Spent Involved in Patient Care on Day of Examination:  36 minutes  Follow up:   Patient Instructions  Will arrange for a home sleep study Will call to arrange for follow up after sleep study reviewed   Medication List:   Allergies as of 06/05/2022       Reactions   Histamine Rash   Flushing        Medication List        Accurate as of June 05, 2022 10:54 AM. If you have any questions, ask your nurse or doctor.          amitriptyline 10 MG tablet Commonly known as: ELAVIL Take 1 tablet (10 mg total) by mouth at bedtime.   ascorbic acid 500 MG tablet Commonly known as: VITAMIN C Take 500 mg by mouth daily.   b complex vitamins capsule Take 1 capsule by mouth daily.   Biotin 1 MG Caps Take by mouth.   CVS VIT D 5000 HIGH-POTENCY PO Take 5,000 Units by mouth daily.   levothyroxine 100 MCG tablet Commonly known as: SYNTHROID Take by mouth.   Magnesium 500 MG Caps Take 500 mg by mouth daily.   melatonin 3 MG Tabs tablet Take 3 mg by mouth at bedtime.   valACYclovir 500 MG tablet Commonly known as: Valtrex Take 1 tablet (500 mg total) by mouth daily.        Signature:  Chesley Mires, MD [place] Pager - [phone] 06/05/2022, 10:54 AM

## 2022-06-12 NOTE — Progress Notes (Unsigned)
   I, Peterson Lombard, LAT, ATC acting as a scribe for Lynne Leader, MD.  Patricia Conley is a 72 y.o. female who presents to [place] at [facility] today for left foot pain.  Patient was previously seen by Dr. Tamala Julian in 2022 for ganglion cyst on her finger.  Today, patient reports that she stepped down wrong,***.  Patient locates pain to ***  L foot swelling: Aggravates: Treatments tried:  Pertinent review of systems: ***  Relevant historical information: ***   Exam:  There were no vitals taken for this visit. General: Well Developed, well nourished, and in no acute distress.   MSK: ***    Lab and Radiology Results No results found for this or any previous visit (from the past 72 hour(s)). No results found.     Assessment and Plan: 72 y.o. female with ***   PDMP not reviewed this encounter. No orders of the defined types were placed in this encounter.  No orders of the defined types were placed in this encounter.    Discussed warning signs or symptoms. Please see discharge instructions. Patient expresses understanding.   ***

## 2022-06-13 ENCOUNTER — Ambulatory Visit (INDEPENDENT_AMBULATORY_CARE_PROVIDER_SITE_OTHER): Payer: Medicare Other | Admitting: Family Medicine

## 2022-06-13 ENCOUNTER — Encounter: Payer: Self-pay | Admitting: Family Medicine

## 2022-06-13 ENCOUNTER — Ambulatory Visit (INDEPENDENT_AMBULATORY_CARE_PROVIDER_SITE_OTHER): Payer: Medicare Other

## 2022-06-13 VITALS — BP 122/80 | HR 90 | Ht 67.0 in | Wt 145.2 lb

## 2022-06-13 DIAGNOSIS — M21619 Bunion of unspecified foot: Secondary | ICD-10-CM | POA: Diagnosis not present

## 2022-06-13 DIAGNOSIS — M79672 Pain in left foot: Secondary | ICD-10-CM | POA: Diagnosis not present

## 2022-06-13 NOTE — Patient Instructions (Addendum)
Thank you for coming in today.   Please complete the exercises that the athletic trainer went over with you:  View at [email or web address]-exercise-[email or web address] using code: [email or web address]  3/4 length arch insole  Check back in 1 month

## 2022-06-14 ENCOUNTER — Telehealth: Payer: Self-pay | Admitting: Gastroenterology

## 2022-06-14 NOTE — Telephone Encounter (Signed)
Hi Dr. Havery Moros,  Supervising Provider: 06/14/22-PM  We received a referral for patient to be evaluated for blood in stool. She does have GI history with Dr. Earlean Shawl and wishes to transfer her care. Records were obtained for you to review and advise on scheduling.    Thanks

## 2022-06-15 NOTE — Progress Notes (Signed)
Left foot x-ray shows no fractures.

## 2022-06-16 ENCOUNTER — Encounter: Payer: Self-pay | Admitting: Gastroenterology

## 2022-06-16 NOTE — Telephone Encounter (Signed)
Patient has ben scheduled

## 2022-06-23 ENCOUNTER — Telehealth (HOSPITAL_BASED_OUTPATIENT_CLINIC_OR_DEPARTMENT_OTHER): Payer: Self-pay | Admitting: Pulmonary Disease

## 2022-06-23 NOTE — Telephone Encounter (Signed)
Pt states she has not heard from anyone about scheduling a home sleep test after OV on 2/12 and would like an update of when she may hear about being scheduled. Please advise and call pt back.

## 2022-06-27 ENCOUNTER — Encounter: Payer: Self-pay | Admitting: Internal Medicine

## 2022-06-27 DIAGNOSIS — R208 Other disturbances of skin sensation: Secondary | ICD-10-CM

## 2022-06-27 MED ORDER — AMITRIPTYLINE HCL 10 MG PO TABS: 10.0000 mg | ORAL_TABLET | Freq: Every day | ORAL | 3 refills | Status: AC

## 2022-07-03 ENCOUNTER — Ambulatory Visit: Payer: Medicare Other

## 2022-07-03 DIAGNOSIS — G4733 Obstructive sleep apnea (adult) (pediatric): Secondary | ICD-10-CM

## 2022-07-11 ENCOUNTER — Telehealth: Payer: Self-pay | Admitting: Pulmonary Disease

## 2022-07-11 DIAGNOSIS — G4733 Obstructive sleep apnea (adult) (pediatric): Secondary | ICD-10-CM | POA: Diagnosis not present

## 2022-07-11 NOTE — Telephone Encounter (Signed)
HST 07/03/22 >> AHI 7.6, SpO2 low 88%   Please inform her that her sleep study shows mild obstructive sleep apnea.  Please arrange for ROV with me or NP to discuss treatment options.

## 2022-07-11 NOTE — Progress Notes (Unsigned)
   Shirlyn Goltz, PhD, LAT, ATC acting as a scribe for Lynne Leader, MD.  Patricia Conley is a 72 y.o. female who presents to [place] at [facility] today for 1 month follow-up left foot pain.  Patient was last seen by Dr. Georgina Snell on 06/13/2022 and was taught HEP and advised for her bunion the importance of arch support.  Today, patient reports***  Dx imaging: 06/13/2022 L foot XR   Pertinent review of systems: ***  Relevant historical information: ***   Exam:  There were no vitals taken for this visit. General: Well Developed, well nourished, and in no acute distress.   MSK: ***    Lab and Radiology Results No results found for this or any previous visit (from the past 72 hour(s)). No results found.     Assessment and Plan: 72 y.o. female with ***   PDMP not reviewed this encounter. No orders of the defined types were placed in this encounter.  No orders of the defined types were placed in this encounter.    Discussed warning signs or symptoms. Please see discharge instructions. Patient expresses understanding.   ***

## 2022-07-11 NOTE — Telephone Encounter (Signed)
Called and gave results to patient and scheduled her for an office visit on 08/07/22 in [place] office with Dr. Halford Chessman.  Also sent mychart message with results as requested by patient. Nothing further needed at this time,

## 2022-07-12 ENCOUNTER — Encounter: Payer: Self-pay | Admitting: Family Medicine

## 2022-07-12 ENCOUNTER — Ambulatory Visit (INDEPENDENT_AMBULATORY_CARE_PROVIDER_SITE_OTHER): Payer: Medicare Other | Admitting: Family Medicine

## 2022-07-12 ENCOUNTER — Other Ambulatory Visit: Payer: Self-pay

## 2022-07-12 VITALS — BP 110/76 | HR 86 | Ht 67.0 in | Wt 143.0 lb

## 2022-07-12 DIAGNOSIS — M79672 Pain in left foot: Secondary | ICD-10-CM

## 2022-07-12 NOTE — Patient Instructions (Addendum)
Thank you for coming in today.   I've referred you to Physical Therapy.  Let us know if you don't hear from them in one week.   Please use Voltaren gel (Generic Diclofenac Gel) up to 4x daily for pain as needed.  This is available over-the-counter as both the name brand Voltaren gel and the generic diclofenac gel.   Check back in 1 month

## 2022-07-19 ENCOUNTER — Ambulatory Visit: Payer: Medicare Other | Admitting: Physical Therapy

## 2022-08-07 ENCOUNTER — Encounter (HOSPITAL_BASED_OUTPATIENT_CLINIC_OR_DEPARTMENT_OTHER): Payer: Self-pay | Admitting: Pulmonary Disease

## 2022-08-07 ENCOUNTER — Ambulatory Visit (INDEPENDENT_AMBULATORY_CARE_PROVIDER_SITE_OTHER): Payer: Medicare Other | Admitting: Pulmonary Disease

## 2022-08-07 VITALS — BP 112/72 | HR 83 | Temp 98.2°F | Ht 66.5 in | Wt 144.0 lb

## 2022-08-07 DIAGNOSIS — G4733 Obstructive sleep apnea (adult) (pediatric): Secondary | ICD-10-CM | POA: Diagnosis not present

## 2022-08-07 DIAGNOSIS — Z7189 Other specified counseling: Secondary | ICD-10-CM | POA: Diagnosis not present

## 2022-08-07 NOTE — Patient Instructions (Signed)
Will arrange for new CPAP set up  Follow up in 4 months 

## 2022-08-07 NOTE — Progress Notes (Signed)
[place] [facility]  Chief Complaint  Patient presents with   Follow-up    Follow up. Patient has no complaints.     Past Surgical History:  She  has a past surgical history that includes Nasal sinus surgery (1995, 2002); Cystectomy (1999); Breast surgery (2002); Total hip arthroplasty (09/12/2011); Joint replacement; Knee arthroscopy (Left, 09/10/2013); and Hemorrhoid banding (07/2014).  Past Medical History:  OA, GERD, Back pain, Osteoporosis, Thyroid cancer with post treatment hypothyroidism  Constitutional:  BP 112/72 (BP Location: Left Arm, Patient Position: Sitting, Cuff Size: Normal)   Pulse 83   Temp 98.2 F (36.8 C) (Oral)   Ht 5' 6.5" (1.689 m)   Wt 144 lb (65.3 kg)   SpO2 95%   BMI 22.89 kg/m   Brief Summary:  Patricia Conley is a 72 y.o. female with obstructive sleep apnea. She has hx of TMJ.       Subjective:   Her sleep study showed mild sleep apnea.  She was seen by genetics and then neurosurgery at [facility].  Doesn't have vascular EDS.  She will need angiogram which is scheduled in June.  She is still snoring and has some sleep disruption.  She does still feel tired during the day at times.   Physical Exam:   Appearance - well kempt   ENMT - no sinus tenderness, no oral exudate, no LAN, Mallampati 3 airway, no stridor, over-bite, grinding with jaw opening at TMJ  Respiratory - equal breath sounds bilaterally, no wheezing or rales  CV - s1s2 regular rate and rhythm, no murmurs  Ext - no clubbing, no edema  Skin - no rashes  Psych - normal mood and affect   Sleep Tests:  PSG 12/26/15 >> AHI 0.7, SaO2 low 89%  HST 09/19/17 >> AHI 13.4, SpO2 low 86% HST 07/03/22 >> AHI 7.6, SpO2 low 88%   Cardiac Tests:  Echo 10/22/14 >> grade 2 DD, mod TR  Social History:  She  reports that she has never smoked. She has never used smokeless tobacco. She reports current alcohol use of about 1.0 standard drink of alcohol per  week. She reports that she does not use drugs.  Family History:  Her family history includes Aneurysm in her mother; Cancer in her mother; Depression in her father; Diabetes in her mother; Drug abuse in her father; Mental illness in her sister; Nephrolithiasis in her mother; Parkinson's disease in her brother; Parkinsonism in her father; Stroke in her mother.    Discussion:  She has history of obstructive sleep apnea and TMJ.  Her sleep symptoms seemed to have progressed since she had thyroid surgery.  She has gained weight since I saw her previously in 2019.  I am concerned she could still have significant obstructive sleep apnea.  Assessment/Plan:   Obstructive sleep apnea. - reviewed her sleep study - discussed how sleep apnea can impact her health - treatment options discussed - oral appliance wouldn't be an option due to TMJ - will arrange for auto CPAP set up  Metastatic papillary thyroid cancer with post-treatment hypothyroidism. - followed by endocrinology and oncology at [facility]  Multiple aneurysms of ICA, Rt P1, ascending aorta. - followed by neurosurgery at [facility]   Time Spent Involved in Patient Care on Day of Examination:  26 minutes  Follow up:   Patient Instructions  Will arrange for new CPAP set up  Follow up in 4 months  Medication List:   Allergies as of 08/07/2022  Reactions   Histamine Rash   Flushing        Medication List        Accurate as of August 07, 2022  4:29 PM. If you have any questions, ask your nurse or doctor.          STOP taking these medications    CVS VIT D 5000 HIGH-POTENCY PO Stopped by: Coralyn Helling, MD       TAKE these medications    amitriptyline 10 MG tablet Commonly known as: ELAVIL Take 1 tablet (10 mg total) by mouth at bedtime.   ascorbic acid 500 MG tablet Commonly known as: VITAMIN C Take 500 mg by mouth daily.   b complex vitamins capsule Take 1 capsule by mouth daily.   Biotin 1 MG Caps Take  by mouth.   levothyroxine 100 MCG tablet Commonly known as: SYNTHROID Take by mouth.   Magnesium 500 MG Caps Take 500 mg by mouth daily.   melatonin 3 MG Tabs tablet Take 3 mg by mouth at bedtime.   valACYclovir 500 MG tablet Commonly known as: Valtrex Take 1 tablet (500 mg total) by mouth daily.        Signature:  Coralyn Helling, MD [facility] Pager - [phone] 08/07/2022, 4:29 PM

## 2022-08-30 ENCOUNTER — Ambulatory Visit (INDEPENDENT_AMBULATORY_CARE_PROVIDER_SITE_OTHER): Payer: Medicare Other | Admitting: Surgery

## 2022-08-30 ENCOUNTER — Encounter: Payer: Self-pay | Admitting: Surgery

## 2022-08-30 VITALS — BP 122/76 | HR 84 | Resp 20 | Ht 66.0 in | Wt 140.0 lb

## 2022-08-30 DIAGNOSIS — I7121 Aneurysm of the ascending aorta, without rupture: Secondary | ICD-10-CM | POA: Diagnosis not present

## 2022-08-30 NOTE — Progress Notes (Signed)
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  Her daughter has Ehlers-Danlos syndrome and the patient has undergone genetic testing at [facility].  The etiology of her multiple aneurysms is still uncertain after genetic testing to date and she is considering undergoing more extensive genetic testing in the future.  She has been feeling well without chest pain or shortness of breath.  Current Outpatient Medications  Medication Sig Dispense Refill   amitriptyline (ELAVIL) 10 MG tablet Take 1 tablet (10 mg total) by mouth at bedtime. 90 tablet 3   b complex vitamins capsule Take 1 capsule by mouth daily.     Biotin 1 MG CAPS Take by mouth.     levothyroxine (SYNTHROID) 100 MCG tablet Take by mouth.     Magnesium 500 MG CAPS Take 500 mg by mouth daily.     melatonin 3 MG TABS tablet Take 3 mg by mouth at bedtime.     valACYclovir (VALTREX) 500 MG tablet Take 1 tablet (500 mg total) by mouth daily. 90 tablet 1   vitamin C (ASCORBIC ACID) 500 MG tablet Take 500 mg by mouth daily.     No current facility-administered medications for this visit.     Physical Exam: BP 122/76 (BP Location: Left Arm, Patient Position: Sitting)   Pulse 84   Resp 20   Ht 5\' 6"  (1.676 m)   Wt 140 lb (63.5 kg)   SpO2 96% Comment: RA  BMI 22.60 kg/m  She looks well. Cardiac exam shows a regular rate and rhythm with normal heart sounds.  There is no murmur. Lungs are clear. There is no peripheral edema.  Diagnostic Tests:  Ciro Backer, MD - 08/02/2022 Formatting of this note might be different from the original. EXAM:  CTA Chest, Abdomen, and Pelvis with contrast  INDICATION: Aortic aneurysm, preoperative planning.  TECHNIQUE: Serial axial images from the thoracic  inlet to the pubic symphysisfollowing the administration of intravenous contrast. Dose reduction was obtained with Automatic Exposure Control (AEC) or, if AEC could not be utilized, by manual adjustment of the mA and/or kV according to patient size.  CONTRAST DOSE: 100 mL of Isovue-370.  COMPARISON: 11/20/2012.  FINDINGS: Vascular: While somewhat difficult to measure accurately due to motion artifact, ascending aorta measures roughly 3.7 cm, within normal limits. Aortic arch is not aneurysmal, with bovine arch conformation. No significant stenoses of the great vessels, and with normal patency of the great vessels, where visualized. Descending aorta is not aneurysmal. Celiac axis normally patent. SMA normally patent. Bilateral renal arteries proximally patent, however notably there is irregularity/BPD appearance of left renal artery, which may be seen in setting of fibromuscular dysplasia, but overall unchanged from the 2014 examination. Aortoiliac and iliofemoral segments normally patent.  CHEST:  LUNG/PLEURA/AIRWAYS:  Minimal atelectatic changes of the bilateral lung bases. Consolidative airspace disease. No suspicious appearing pulmonary nodules or masses. No pleural effusions or pneumothorax. No endobronchial lesions.  MEDIASTINUM/GREAT VESSELS:  Thyroid gland is unremarkable. Heart size is normal. No pericardial effusion. No pathologic mediastinal or hilar adenopathy. Thoracic aorta and main pulmonary artery are of normal caliber.   ABDOMEN:  Liver/gallbladder: Wedge-shaped area of hypoperfusion in the posterior right hepatic dome, unchanged in appearance since 2014. Normal in attenuation. No discrete lesion seen. No intra  or extrahepatic biliary ductal dilatation.. Gallbladder is unremarkable. Portal vein is patent, with heterogeneity within likely related to contrast mixing  Pancreas: No peripancreatic fat stranding or ductal dilation.  Spleen:  Unremarkable.  Adrenal glands: Unremarkable.  Kidneys: No focal lesions or hydronephrosis.  Bowel: No evidence of bowel obstruction. No free intraperitoneal air or ascites. Appendix is normal caliber  Pelvic viscera: Unremarkable.  Other: No abdominal or pelvic lymphadenopathy.  BONES: Left total hip arthroplasty. No acute osseous abnormality.  Impression:  1. No CT evidence of acute intrathoracic or intra-abdominal pathology.  2. No evidence of acute aortic syndrome; no discrete aneurysm, dissection, or penetrating atherosclerotic ulcer. No significant atherosclerotic disease. Irregular/nodular appearance of the left renal artery, which may be seen in the setting of fibromuscular dysplasia, however this appearance is also unchanged from 2014  Electronically Signed by:  Ciro Backer, MD, [facility] Electronically Signed on:  08/02/2022 12:27 PM Exam End: 08/02/22 11:42   Specimen Collected: 08/02/22 11:01 Last Resulted: 08/02/22 12:27  Received From: [facility]  Result Received: 08/07/22 15:40     Impression:  She has some ectasia of the ascending aorta measured at 3.7 cm in maximum diameter on her recent CTA of the chest.  This is slightly smaller than on her previous CT scan of the chest in September 2023 when it was measured at 4.1 cm.  This is well below the surgical threshold of 5 to 5.5 cm in this patient with a trileaflet aortic valve.  There is a family history of connective tissue disorder in her daughter with Ehlers-Danlos syndrome and it is certainly possible that the patient may have some variant of that although it is uncertain from current genetic testing.  I reviewed the CT images with her and answered all of her questions.  I stressed the importance of continued good blood pressure control in preventing further enlargement and acute aortic dissection.  I advised her against doing any heavy lifting or strenuous physical activity that may require  a Valsalva maneuver and could suddenly raise her blood pressure to high levels.  Plan:  She is going to have CT scan follow-up of her carotid artery aneurysms next spring at [facility] and will have a CTA of the chest done at the same time.  I will plan to see her back after that to review the results with her.  I spent 15 minutes performing this established patient evaluation and > 50% of this time was spent face to face counseling and coordinating the care of this patient's aortic aneurysm.   Alleen Borne, MD [facility] [phone]

## 2022-09-01 ENCOUNTER — Encounter: Payer: Self-pay | Admitting: Gastroenterology

## 2022-09-01 ENCOUNTER — Ambulatory Visit (INDEPENDENT_AMBULATORY_CARE_PROVIDER_SITE_OTHER): Payer: Medicare Other | Admitting: Gastroenterology

## 2022-09-01 ENCOUNTER — Other Ambulatory Visit: Payer: Medicare Other

## 2022-09-01 VITALS — BP 110/74 | HR 78 | Ht 66.0 in | Wt 142.0 lb

## 2022-09-01 DIAGNOSIS — R32 Unspecified urinary incontinence: Secondary | ICD-10-CM | POA: Diagnosis not present

## 2022-09-01 DIAGNOSIS — K649 Unspecified hemorrhoids: Secondary | ICD-10-CM | POA: Diagnosis not present

## 2022-09-01 DIAGNOSIS — R194 Change in bowel habit: Secondary | ICD-10-CM

## 2022-09-01 DIAGNOSIS — R159 Full incontinence of feces: Secondary | ICD-10-CM | POA: Diagnosis not present

## 2022-09-01 NOTE — Patient Instructions (Addendum)
Your provider has requested that you go to the basement level for lab work before leaving today. Press "B" on the elevator. The lab is located at the first door on the left as you exit the elevator. ________________________________________________________  Please purchase the following medications over the counter and take as directed: Citrucel daily (as per box instructions) ________________________________________________________  We have placed a referral for pelvic floor therapy to help with fecal and urinary incontinence. If you do not hear from Pelvic floor therapy office within the next 2 weeks, please contact them directly as they have already been sent a referral.  [address] [place],  [place]  [address]   Main #: [phone] _______________________________________________________  Bonita Quin will be due for a recall colonoscopy in 07/2023. We will send you a reminder in the mail when it gets closer to that time.  _______________________________________________________  If your blood pressure at your visit was 140/90 or greater, please contact your primary care physician to follow up on this.  _______________________________________________________  If you are age 70 or older, your body mass index should be between 23-30. Your Body mass index is 22.92 kg/m. If this is out of the aforementioned range listed, please consider follow up with your Primary Care Provider.  If you are age 70 or younger, your body mass index should be between 19-25. Your Body mass index is 22.92 kg/m. If this is out of the aformentioned range listed, please consider follow up with your Primary Care Provider.   ________________________________________________________  The [facility] providers would like to encourage you to use [facility] to communicate with providers for non-urgent requests or questions.  Due to long hold times on the telephone, sending your provider a message by [facility] may be a  faster and more efficient way to get a response.  Please allow 48 business hours for a response.  Please remember that this is for non-urgent requests.  _______________________________________________________  Due to recent changes in healthcare laws, you may see the results of your imaging and laboratory studies on MyChart before your provider has had a chance to review them.  We understand that in some cases there may be results that are confusing or concerning to you. Not all laboratory results come back in the same time frame and the provider may be waiting for multiple results in order to interpret others.  Please give [place] 48 hours in order for your provider to thoroughly review all the results before contacting the office for clarification of your results.

## 2022-09-01 NOTE — Progress Notes (Signed)
HPI :  72 year old female with a history of papillary thyroid carcinoma, aortic aneurysm, hemorrhoids, self-referred here to establish her GI care.  She was previously followed by Dr. Kinnie Scales but he has since retired and she is looking to establish for routine GI care.  She reports she has had an issue seeing "mucus" in her stool for years.  When this started she had a colonoscopy with Dr. Kinnie Scales in April 2021, she had 1 polyp removed and otherwise the exam was normal without any concerning pathology.  No colitis.  She states the mucus comes and goes that she sees in her stool.  She states it looks like "phlegm".  She denies fat or grease in her stool.  She states she has a 1-2 bowel movements per morning, occasionally has hard stools with pellet form, that can come and go.  No blood in her stools that she sees routinely but can sometimes see a very scant amount of blood in the mucus.  She has a rare occasional urgency with her stool.  If she has an urgency and cannot get to the bathroom in time she can have fecal incontinence.  She also has been having similar urinary incontinence as well.  She has done some Kegel exercises at home which she states have helped a little bit.  She denies any abdominal pains.  She denies any medication changes that she thinks would be related to her symptoms at all.  She does take magnesium for sleep.  She has had symptomatic hemorrhoids in the past that bother her, she had hemorrhoid banding with Dr. Kinnie Scales for this and it worked well for her.  She really does not have any hemorrhoid symptoms that bother her currently.  She otherwise feels well today without complaints.  She has an aneurysm of her aorta followed by CT surgery and they are monitoring for now.  CTA 07/2012: Impression:   1. No CT evidence of acute intrathoracic or intra-abdominal pathology.   2. No evidence of acute aortic syndrome; no discrete aneurysm, dissection,  or penetrating atherosclerotic  ulcer. No significant atherosclerotic  disease. Irregular/nodular appearance of the left renal artery, which may  be seen in the setting of fibromuscular dysplasia, however this appearance  is also unchanged from 2014    Colonoscopy - Dr. Kinnie Scales - 08/21/2019 - 1 cecal polyp - adenoma- told to repeat in 5 years    Past Medical History:  Diagnosis Date   Acetabular labrum tear    right/ per pt history   Aneurysm of ascending aorta without rupture (HCC)    Arthritis    Bradycardia    Colon polyps    DJD (degenerative joint disease)    EDS (Ehlers-Danlos syndrome)    Gall bladder disease    per pt report   Ganglion cyst    GERD (gastroesophageal reflux disease)    Hemorrhoids    History of colon polyps 07/2019   tubular adenoma   Low back pain 08/20/2016   Menopause    OSA (obstructive sleep apnea)    Osteoporosis    Pseudoaneurysm of carotid artery (HCC)    Sleep apnea    Telogen effluvium    Thyroid cancer (HCC) 2023   Thyroid disease      Past Surgical History:  Procedure Laterality Date   BREAST SURGERY  04/24/2000   cyst on Left breast   CYSTECTOMY  04/24/1997   Right elbow   HEMORRHOID BANDING  07/24/2014   JOINT REPLACEMENT  KNEE ARTHROSCOPY Left 09/10/2013   Procedure: LEFT MEDIAL AND LATERAL KNEE ARTHROSCOPY WITH MENISCAL DEBRIDEMENT ;  Surgeon: Loanne Drilling, MD;  Location: [facility];  Service: Orthopedics;  Laterality: Left;   NASAL SINUS SURGERY  1995, 2002   THYROIDECTOMY  02/2022   thyroid cancer   TOTAL HIP ARTHROPLASTY  09/12/2011   Procedure: TOTAL HIP ARTHROPLASTY ANTERIOR APPROACH;  Surgeon: [name], MD;  Location: [facility];  Service: Orthopedics;  Laterality: Left;   Family History  Problem Relation Age of Onset   Stroke Mother    Nephrolithiasis Mother    Diabetes Mother    Aneurysm Mother        x2   Cancer Mother        lymphoma, breast cancer   Depression Father    Parkinsonism Father    Drug abuse Father    Mental illness  Sister        paranoia   Parkinson's disease Brother    Stomach cancer Neg Hx    Colon cancer Neg Hx    Esophageal cancer Neg Hx    Social History   Tobacco Use   Smoking status: Never   Smokeless tobacco: Never  Vaping Use   Vaping Use: Never used  Substance Use Topics   Alcohol use: Yes    Alcohol/week: 1.0 standard drink of alcohol    Types: 1 Standard drinks or equivalent per week    Comment: once weekly or 2-3 drinks per month   Drug use: No   Current Outpatient Medications  Medication Sig Dispense Refill   amitriptyline (ELAVIL) 10 MG tablet Take 1 tablet (10 mg total) by mouth at bedtime. 90 tablet 3   b complex vitamins capsule Take 1 capsule by mouth daily.     Biotin 1 MG CAPS Take by mouth.     levothyroxine (SYNTHROID) 100 MCG tablet Take by mouth.     Magnesium 500 MG CAPS Take 500 mg by mouth daily.     melatonin 3 MG TABS tablet Take 3 mg by mouth at bedtime.     valACYclovir (VALTREX) 500 MG tablet Take 1 tablet (500 mg total) by mouth daily. 90 tablet 1   vitamin C (ASCORBIC ACID) 500 MG tablet Take 500 mg by mouth daily.     No current facility-administered medications for this visit.   Allergies  Allergen Reactions   Histamine Rash    Flushing     Review of Systems: All systems reviewed and negative except where noted in HPI.   Lab Results  Component Value Date   WBC 4.9 01/25/2022   HGB 13.1 01/25/2022   HCT 39.6 01/25/2022   MCV 88.6 01/25/2022   PLT 213.0 01/25/2022    Lab Results  Component Value Date   CREATININE 0.74 01/25/2022   BUN 17 01/25/2022   NA 142 01/25/2022   K 4.6 01/25/2022   CL 107 01/25/2022   CO2 28 01/25/2022    Lab Results  Component Value Date   ALT 19 01/25/2022   AST 23 01/25/2022   ALKPHOS 54 01/25/2022   BILITOT 0.6 01/25/2022     Physical Exam: BP 110/74   Pulse 78   Ht 5\' 6"  (1.676 m)   Wt 142 lb (64.4 kg)   BMI 22.92 kg/m  Constitutional: Pleasant,well-developed, female in no acute  distress. HEENT: Normocephalic and atraumatic. Conjunctivae are normal. No scleral icterus. Neck supple.  Cardiovascular: Normal rate, regular rhythm.  Pulmonary/chest: Effort normal and breath sounds  normal. No wheezing, rales or rhonchi. Abdominal: Soft, nondistended, nontender. Bowel sounds active throughout. There are no masses palpable. No hepatomegaly. DRE / Anoscopy - [facility] Dottie Smith as standby - small skin tag, no fissure, small hemorrhoids. Mildly low resting tone and squeeze, normal decent. No polyps or mass lesions   Extremities: no edema Lymphadenopathy: No cervical adenopathy noted. Neurological: Alert and oriented to person place and time. Skin: Skin is warm and dry. No rashes noted. Psychiatric: Normal mood and affect. Behavior is normal.   ASSESSMENT: 72 y.o. female here for assessment of the following  1. Altered bowel habits   2. Incontinence of feces, unspecified fecal incontinence type   3. Urinary incontinence, unspecified type    As above, patient with intermittent constipation and passing hard stools, along with intermittently seeing "mucus in her stools".  Prior workup for colonoscopy of this issue was normal.  She does have occasional urgency and when she has this she has a hard time controlling the stool and has led to episodic fecal incontinence.  DRE and anoscopy done today, in light of scant bleeding symptoms.  She does have hemorrhoids which are likely the cause of this in the setting of occasional constipation.  Of note, pelvic floor muscles seem low and I suspect associated with her incontinence in the setting of urgency.  We discussed options.   I reassured her that given her colonoscopy was normal at the time of the symptoms I do not think we are missing anything concerning.  Colitis seems unlikely, mucus can be normal part of bowel secretions.  Discussed options, will get fecal calprotectin to ensure no inflammatory process in light of her occasional  urgency.  Otherwise, recommend Citrucel once daily to provide some regularity and treat mild constipation.  She is agreeable to try this.  I also recommend referral to pelvic floor PT for both her fecal and urinary incontinence based on her DRE and history today, I think this will help both of these issues.  She was appreciative of this and wants to proceed with this.  Her colonoscopy is up-to-date, Dr. Kinnie Scales told her initially due in April 2025, since that time guidelines have changed and she can likely go 7 years from her last exam, but can see her at that time to discuss how she wants to proceed.  Follow-up if symptoms persist despite these measures   PLAN: - start Citrucel once daily - lab for fecal calprotectin  - refer to pelvic floor PT - for fecal and urinary incontinence - holding off on colonoscopy for now - recall colon 07/2023 to 07/2025  Harlin Rain, MD [facility]  CC: Pincus Sanes, MD

## 2022-09-04 ENCOUNTER — Other Ambulatory Visit: Payer: Medicare Other

## 2022-09-04 DIAGNOSIS — R159 Full incontinence of feces: Secondary | ICD-10-CM

## 2022-09-04 DIAGNOSIS — R32 Unspecified urinary incontinence: Secondary | ICD-10-CM

## 2022-09-04 DIAGNOSIS — R194 Change in bowel habit: Secondary | ICD-10-CM

## 2022-09-09 LAB — CALPROTECTIN: Calprotectin: 5 mcg/g

## 2022-09-28 ENCOUNTER — Encounter (HOSPITAL_BASED_OUTPATIENT_CLINIC_OR_DEPARTMENT_OTHER): Payer: Self-pay | Admitting: Pulmonary Disease

## 2022-09-28 ENCOUNTER — Ambulatory Visit (INDEPENDENT_AMBULATORY_CARE_PROVIDER_SITE_OTHER): Payer: Medicare Other | Admitting: Pulmonary Disease

## 2022-09-28 VITALS — BP 116/64 | HR 77 | Ht 66.0 in | Wt 138.4 lb

## 2022-09-28 DIAGNOSIS — G4733 Obstructive sleep apnea (adult) (pediatric): Secondary | ICD-10-CM | POA: Diagnosis not present

## 2022-09-28 NOTE — Progress Notes (Signed)
[name]  Chief Complaint  Patient presents with   Follow-up    F/U for OSA. States she is still getting used to her mask.     Past Surgical History:  She  has a past surgical history that includes Nasal sinus surgery (1995, 2002); Cystectomy (04/24/1997); Breast surgery (04/24/2000); Total hip arthroplasty (09/12/2011); Joint replacement; Knee arthroscopy (Left, 09/10/2013); Hemorrhoid banding (07/24/2014); and Thyroidectomy (02/2022).  Past Medical History:  OA, GERD, Back pain, Osteoporosis, Thyroid cancer with post treatment hypothyroidism  Constitutional:  BP 116/64   Pulse 77   Ht 5\' 6"  (1.676 m)   Wt 138 lb 6.4 oz (62.8 kg)   SpO2 99% Comment: on RA  BMI 22.34 kg/m   Brief Summary:  Patricia Conley is a 72 y.o. female with obstructive sleep apnea. She has hx of TMJ.       Subjective:   She felt great when she first started using CPAP.  Things have leveled off since then.  She started with a nasal pillow mask, but not thinks she wants a bigger mask.  Her mask shifts a lot when she roles in bed.  She is concerned about using distilled water and risk of mold/bacteria.  She tried using her CPAP without running the humidifier, but then her nose got irritated.  She has been busy planning her daughters wedding which is happening in July.  As a result she wasn't able to use her CPAP when she travelled for a few days.    Physical Exam:   Appearance - well kempt   ENMT - no sinus tenderness, no oral exudate, no LAN, Mallampati 3 airway, no stridor, over-bite, grinding with jaw opening at TMJ  Respiratory - equal breath sounds bilaterally, no wheezing or rales  CV - s1s2 regular rate and rhythm, no murmurs  Ext - no clubbing, no edema  Skin - no rashes  Psych - normal mood and affect   Sleep Tests:  PSG 12/26/15 >> AHI 0.7, SaO2 low 89%  HST 09/19/17 >> AHI 13.4, SpO2 low 86% HST 07/03/22 >> AHI 7.6, SpO2 low 88%  Auto  CPAP 08/27/22 to 09/25/22 >> used on 20 of 30 nights with average 7 hrs 43 min.  Average AHI 2.5 with median CPAP 7 and 95 th percentile CPAP 9 cm H2O  Cardiac Tests:  Echo 10/22/14 >> grade 2 DD, mod TR  Social History:  She  reports that she has never smoked. She has never used smokeless tobacco. She reports current alcohol use of about 1.0 standard drink of alcohol per week. She reports that she does not use drugs.  Family History:  Her family history includes Aneurysm in her mother; Cancer in her mother; Depression in her father; Diabetes in her mother; Drug abuse in her father; Mental illness in her sister; Nephrolithiasis in her mother; Parkinson's disease in her brother; Parkinsonism in her father; Stroke in her mother.     Assessment/Plan:   Obstructive sleep apnea. 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 - followed by endocrinology and oncology at [facility]  Multiple aneurysms of ICA, Rt P1, ascending aorta. -  followed by neurosurgery at [facility]   Time Spent Involved in Patient Care on Day of Examination:  25 minutes  Follow up:   Patient Instructions  Will have [employer] refit your CPAP mask  Follow up in 1 year  Medication List:   Allergies as of 09/28/2022       Reactions   Histamine Rash   Flushing        Medication List        Accurate as of September 28, 2022 11:14 AM. If you have any questions, ask your nurse or doctor.          amitriptyline 10 MG tablet Commonly known as: ELAVIL Take 1 tablet (10 mg total) by mouth at bedtime.   ascorbic acid 500 MG tablet Commonly known as: VITAMIN C Take 500 mg by mouth  daily.   b complex vitamins capsule Take 1 capsule by mouth daily.   Biotin 1 MG Caps Take by mouth.   levothyroxine 100 MCG tablet Commonly known as: SYNTHROID Take by mouth.   Magnesium 500 MG Caps Take 500 mg by mouth daily.   melatonin 3 MG Tabs tablet Take 3 mg by mouth at bedtime.   valACYclovir 500 MG tablet Commonly known as: Valtrex Take 1 tablet (500 mg total) by mouth daily.        Signature:  Coralyn Helling, MD [facility] Pager - [phone] 09/28/2022, 11:14 AM

## 2022-09-28 NOTE — Patient Instructions (Signed)
Will have [employer] refit your CPAP mask  Follow up in 1 year

## 2022-09-29 NOTE — Therapy (Unsigned)
OUTPATIENT PHYSICAL THERAPY FEMALE PELVIC EVALUATION   Patient Name: Patricia Conley MRN: [id number] DOB:1951-03-17, 72 y.o., female Today's Date: 09/29/2022  END OF SESSION:   Past Medical History:  Diagnosis Date   Acetabular labrum tear    right/ per pt history   Aneurysm of ascending aorta without rupture (HCC)    Arthritis    Bradycardia    Colon polyps    DJD (degenerative joint disease)    EDS (Ehlers-Danlos syndrome)    Gall bladder disease    per pt report   Ganglion cyst    GERD (gastroesophageal reflux disease)    Hemorrhoids    History of colon polyps 07/2019   tubular adenoma   Low back pain 08/20/2016   Menopause    OSA (obstructive sleep apnea)    Osteoporosis    Pseudoaneurysm of carotid artery (HCC)    Sleep apnea    Telogen effluvium    Thyroid cancer (HCC) 2023   Thyroid disease    Past Surgical History:  Procedure Laterality Date   BREAST SURGERY  04/24/2000   cyst on Left breast   CYSTECTOMY  04/24/1997   Right elbow   HEMORRHOID BANDING  07/24/2014   JOINT REPLACEMENT     KNEE ARTHROSCOPY Left 09/10/2013   Procedure: LEFT MEDIAL AND LATERAL KNEE ARTHROSCOPY WITH MENISCAL DEBRIDEMENT ;  Surgeon: Loanne Drilling, MD;  Location: [facility];  Service: Orthopedics;  Laterality: Left;   NASAL SINUS SURGERY  1995, 2002   THYROIDECTOMY  02/2022   thyroid cancer   TOTAL HIP ARTHROPLASTY  09/12/2011   Procedure: TOTAL HIP ARTHROPLASTY ANTERIOR APPROACH;  Surgeon: [name], MD;  Location: [facility];  Service: Orthopedics;  Laterality: Left;   Patient Active Problem List   Diagnosis Date Noted   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   EDS (Ehlers-Danlos syndrome) 01/26/2022   Carotid aneurysm, 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   Papillary thyroid carcinoma (HCC) 12/15/2021   Aneurysm of ascending aorta without rupture (HCC) 12/15/2021   Prediabetes 04/12/2021   Epidermoid cyst of finger of left hand 03/24/2021   Urinary frequency 10/05/2020   Myalgia  10/05/2020   Arthralgia 10/05/2020   Fatigue 10/04/2020   Cervical lymphadenopathy 04/16/2019   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   Burning sensation of skin, scalp 05/15/2017   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   Xerostomia 10/18/2016   Allergic rhinitis caused by mold 10/18/2016   Ganglion cyst of finger of right hand 08/08/2016   Loss of transverse plantar arch 06/12/2016   Mass of joint of finger 04/16/2016   Pap smear abnormality of cervix with ASCUS favoring benign 02/08/2016   OSA (obstructive sleep apnea) 12/28/2015   Palpitations 08/05/2015   Genital herpes 07/27/2015   Osteoporosis 03/25/2015   Dry eye 03/25/2015   Hair loss 09/18/2014   Bunion of great toe 11/15/2012   DJD (degenerative joint disease) 11/11/2012   History of gout 11/11/2012   S/P left THA, AA 09/12/2011   Hemorrhoids, external without complications 08/18/2011   Osteoarthritis of hip 03/14/2011   Renal cyst 12/15/2010   Adrenal nodule (HCC) 12/15/2010   Liver hemangioma 12/15/2010   Gall bladder disease     PCP: Pincus Sanes, MD  REFERRING PROVIDER: Benancio Deeds, MD   REFERRING DIAG:  R19.4 (ICD-10-CM) - Altered bowel habits  R15.9 (ICD-10-CM) - Incontinence of feces, unspecified fecal incontinence type  R32 (ICD-10-CM) - Urinary incontinence, unspecified type    THERAPY DIAG:  No diagnosis found.  Rationale for Evaluation and Treatment: Rehabilitation  ONSET DATE: ***  SUBJECTIVE:                                                                                                                                                                                           SUBJECTIVE STATEMENT: *** Fluid intake: {Yes/No:304960894}   PAIN:  Are you having pain? {yes/no:20286} NPRS scale: ***/10 Pain location: {pelvic pain location:27098}  Pain type: {type:313116} Pain description: {PAIN DESCRIPTION:21022940}   Aggravating factors: *** Relieving factors: ***  PRECAUTIONS: Other:  cancer  WEIGHT BEARING RESTRICTIONS: No  FALLS:  Has patient fallen in last 6 months? {fallsyesno:27318}  LIVING ENVIRONMENT: Lives with: {[facility] lives with:25569::"lives with their family"} Lives in: {Lives in:25570} Stairs: {opstairs:27293} Has following equipment at home: {Assistive devices:23999}  OCCUPATION: ***  PLOF: {PLOF:24004}  PATIENT GOALS: ***  PERTINENT HISTORY:  papillary thyroid carcinoma  Sexual abuse: {Yes/No:304960894}  BOWEL MOVEMENT: Pain with bowel movement: {yes/no:20286} Type of bowel movement:{PT BM type:27100} Fully empty rectum: {Yes/No:304960894} Leakage: {Yes/No:304960894} Pads: {Yes/No:304960894} Fiber supplement: {Yes/No:304960894}  URINATION: Pain with urination: {yes/no:20286} Fully empty bladder: {Yes/No:304960894} Stream: {PT urination:27102} Urgency: {Yes/No:304960894} Frequency: *** Leakage: {PT leakage:27103} Pads: {Yes/No:304960894}  INTERCOURSE: Pain with intercourse: {pain with intercourse PA:27099} Ability to have vaginal penetration:  {Yes/No:304960894} Climax: *** Marinoff Scale: ***/3  PREGNANCY: Vaginal deliveries *** Tearing {Yes***/No:304960894} C-section deliveries *** Currently pregnant {Yes***/No:304960894}  PROLAPSE: {PT prolapse:27101}   OBJECTIVE:   DIAGNOSTIC FINDINGS:  ***  PATIENT SURVEYS:  {rehab surveys:24030}  PFIQ-7 ***  COGNITION: Overall cognitive status: {cognition:24006}     SENSATION: Light touch: {intact/deficits:24005} Proprioception: {intact/deficits:24005}  MUSCLE LENGTH: Hamstrings: Right *** deg; Left *** deg Thomas test: Right *** deg; Left *** deg  LUMBAR SPECIAL TESTS:  {lumbar special test:25242}  FUNCTIONAL TESTS:  {Functional tests:24029}  GAIT: Distance walked: *** Assistive device utilized: {Assistive devices:23999} Level of assistance: {Levels of assistance:24026} Comments: ***  POSTURE: {posture:25561}  PELVIC  ALIGNMENT:  LUMBARAROM/PROM:  A/PROM A/PROM  eval  Flexion   Extension   Right lateral flexion   Left lateral flexion   Right rotation   Left rotation    (Blank rows = not tested)  LOWER EXTREMITY ROM:  {AROM/PROM:27142} ROM Right eval Left eval  Hip flexion    Hip extension    Hip abduction    Hip adduction    Hip internal rotation    Hip external rotation    Knee flexion    Knee extension    Ankle dorsiflexion    Ankle plantarflexion    Ankle inversion    Ankle eversion     (Blank rows = not tested)  LOWER EXTREMITY MMT:  MMT Right eval Left eval  Hip flexion    Hip extension    Hip abduction    Hip adduction    Hip internal rotation    Hip external rotation    Knee flexion    Knee extension    Ankle dorsiflexion    Ankle plantarflexion    Ankle inversion    Ankle eversion     PALPATION:   General  ***                External Perineal Exam ***                             Internal Pelvic Floor ***  Patient confirms identification and approves PT to assess internal pelvic floor and treatment {yes/no:20286}  PELVIC MMT:   MMT eval  Vaginal   Internal Anal Sphincter   External Anal Sphincter   Puborectalis   Diastasis Recti   (Blank rows = not tested)        TONE: ***  PROLAPSE: ***  TODAY'S TREATMENT:                                                                                                                              DATE: ***  EVAL ***   PATIENT EDUCATION:  Education details: *** Person educated: {Person educated:25204} Education method: {Education Method:25205} Education comprehension: {Education Comprehension:25206}  HOME EXERCISE PROGRAM: ***  ASSESSMENT:  CLINICAL IMPRESSION: Patient is a *** y.o. *** who was seen today for physical therapy evaluation and treatment for ***.   OBJECTIVE IMPAIRMENTS: {opptimpairments:25111}.   ACTIVITY LIMITATIONS: {activitylimitations:27494}  PARTICIPATION LIMITATIONS:  {participationrestrictions:25113}  PERSONAL FACTORS: {Personal factors:25162} are also affecting patient's functional outcome.   REHAB POTENTIAL: {rehabpotential:25112}  CLINICAL DECISION MAKING: {clinical decision making:25114}  EVALUATION COMPLEXITY: {Evaluation complexity:25115}   GOALS: Goals reviewed with patient? {yes/no:20286}  [place] TERM GOALS: Target date: ***  *** Baseline: Goal status: {GOALSTATUS:25110}  2.  *** Baseline:  Goal status: {GOALSTATUS:25110}  3.  *** Baseline:  Goal status: {GOALSTATUS:25110}  4.  *** Baseline:  Goal status: {GOALSTATUS:25110}  5.  *** Baseline:  Goal status: {GOALSTATUS:25110}  6.  *** Baseline:  Goal status: {GOALSTATUS:25110}  LONG TERM GOALS: Target date: ***  *** Baseline:  Goal status: {GOALSTATUS:25110}  2.  *** Baseline:  Goal status: {GOALSTATUS:25110}  3.  *** Baseline:  Goal status: {GOALSTATUS:25110}  4.  *** Baseline:  Goal status: {GOALSTATUS:25110}  5.  *** Baseline:  Goal status: {GOALSTATUS:25110}  6.  *** Baseline:  Goal status: {GOALSTATUS:25110}  PLAN:  PT FREQUENCY: {rehab frequency:25116}  PT DURATION: {rehab duration:25117}  PLANNED INTERVENTIONS: {rehab planned interventions:25118::"Therapeutic exercises","Therapeutic activity","Neuromuscular re-education","Balance training","Gait training","Patient/Family education","Self Care","Joint mobilization"}  PLAN FOR NEXT SESSION: ***   Maclin Guerrette, PT 09/29/2022, 12:25 PM

## 2022-10-02 ENCOUNTER — Ambulatory Visit: Payer: Medicare Other | Attending: Gastroenterology | Admitting: Physical Therapy

## 2022-10-02 ENCOUNTER — Encounter: Payer: Self-pay | Admitting: Physical Therapy

## 2022-10-02 ENCOUNTER — Other Ambulatory Visit: Payer: Self-pay

## 2022-10-02 DIAGNOSIS — M6281 Muscle weakness (generalized): Secondary | ICD-10-CM | POA: Insufficient documentation

## 2022-10-02 DIAGNOSIS — R278 Other lack of coordination: Secondary | ICD-10-CM | POA: Insufficient documentation

## 2022-10-02 NOTE — Patient Instructions (Signed)
Moisturizers They are used in the vagina to hydrate the mucous membrane that make up the vaginal canal. Designed to keep a more normal acid balance (ph) Once placed in the vagina, it will last between two to three days.  Use 2-3 times per week at bedtime  Ingredients to avoid is glycerin and fragrance, can increase chance of infection Should not be used just before sex due to causing irritation Most are gels administered either in a tampon-shaped applicator or as a vaginal suppository. They are non-hormonal.   Types of Moisturizers(internal use)  Vitamin E vaginal suppositories- Whole foods, Amazon Moist Again Coconut oil- can break down condoms Julva- (Do no use if on Tamoxifen) amazon Yes moisturizer- amazon NeuEve Silk , NeuEve Silver for menopausal or over 65 (if have severe vaginal atrophy or cancer treatments use NeuEve Silk for  1 month than move to NeuEve Silver)- Amazon, Neuve.com Olive and Bee intimate cream- [email or web address].com.au Mae vaginal moisturizer- Amazon Aloe Good Clean Love Hyaluronic acid Hyalofemme replens   Creams to use externally on the Vulva area Desert Harvest Releveum (good for for cancer patients that had radiation to the area)- amazon or www.desertharvest.com V-magic cream - amazon Julva-amazon Vital "V Wild Yam salve ( help moisturize and help with thinning vulvar area, does have Beeswax MoodMaid Botanical Pro-Meno Wild Yam Cream- Amazon Desert Harvest Gele Cleo by Damiva labial moisturizer (Amazon,  Coconut or olive oil aloe Good Clean Love Enchanted Rose by intimate rose  Things to avoid in the vaginal area Do not use things to irritate the vulvar area No lotions just specialized creams for the vulva area- Neogyn, V-magic, No soaps; can use Aveeno or Calendula cleanser if needed. Must be gentle No deodorants No douches Good to sleep without underwear to let the vaginal area to air out No scrubbing: spread the lips to let warm water rinse  over labias and pat dry  [facility] [address], [address] [place], [place] [address] Phone # [phone] Fax [phone]  

## 2022-10-09 ENCOUNTER — Ambulatory Visit: Payer: Medicare Other | Admitting: Physical Therapy

## 2022-10-09 ENCOUNTER — Encounter: Payer: Self-pay | Admitting: Physical Therapy

## 2022-10-09 DIAGNOSIS — M6281 Muscle weakness (generalized): Secondary | ICD-10-CM

## 2022-10-09 DIAGNOSIS — R278 Other lack of coordination: Secondary | ICD-10-CM

## 2022-10-09 NOTE — Therapy (Signed)
OUTPATIENT PHYSICAL THERAPY FEMALE PELVIC TREATMENT   Patient Name: Patricia Conley MRN: [id number] DOB:Aug 28, 1950, 72 y.o., female Today's Date: 10/09/2022  END OF SESSION:  PT End of Session - 10/09/22 1233     Visit Number 2    Date for PT Re-Evaluation 12/25/22    Authorization Type Medicare    Authorization - Visit Number 2    Authorization - Number of Visits 10    PT Start Time 1230    PT Stop Time 1310    PT Time Calculation (min) 40 min    Activity Tolerance Patient tolerated treatment well    Behavior During Therapy WFL for tasks assessed/performed             Past Medical History:  Diagnosis Date   Acetabular labrum tear    right/ per pt history   Aneurysm of ascending aorta without rupture (HCC)    Arthritis    Bradycardia    Colon polyps    DJD (degenerative joint disease)    EDS (Ehlers-Danlos syndrome)    Gall bladder disease    per pt report   Ganglion cyst    GERD (gastroesophageal reflux disease)    Hemorrhoids    History of colon polyps 07/2019   tubular adenoma   Low back pain 08/20/2016   Menopause    OSA (obstructive sleep apnea)    Osteoporosis    Pseudoaneurysm of carotid artery (HCC)    Sleep apnea    Telogen effluvium    Thyroid cancer (HCC) 2023   Thyroid disease    Past Surgical History:  Procedure Laterality Date   BREAST SURGERY  04/24/2000   cyst on Left breast   CYSTECTOMY  04/24/1997   Right elbow   HEMORRHOID BANDING  07/24/2014   JOINT REPLACEMENT     KNEE ARTHROSCOPY Left 09/10/2013   Procedure: LEFT MEDIAL AND LATERAL KNEE ARTHROSCOPY WITH MENISCAL DEBRIDEMENT ;  Surgeon: Loanne Drilling, MD;  Location: [facility];  Service: Orthopedics;  Laterality: Left;   NASAL SINUS SURGERY  1995, 2002   THYROIDECTOMY  02/2022   thyroid cancer   TOTAL HIP ARTHROPLASTY  09/12/2011   Procedure: TOTAL HIP ARTHROPLASTY ANTERIOR APPROACH;  Surgeon: [name], MD;  Location: [facility];  Service: Orthopedics;  Laterality: Left;    Patient Active Problem List   Diagnosis Date Noted   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   EDS (Ehlers-Danlos syndrome) 01/26/2022   Carotid aneurysm, 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   Papillary thyroid carcinoma (HCC) 12/15/2021   Aneurysm of ascending aorta without rupture (HCC) 12/15/2021   Prediabetes 04/12/2021   Epidermoid cyst of finger of left hand 03/24/2021   Urinary frequency 10/05/2020   Myalgia 10/05/2020   Arthralgia 10/05/2020   Fatigue 10/04/2020   Cervical lymphadenopathy 04/16/2019   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   Burning sensation of skin, scalp 05/15/2017   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   Xerostomia 10/18/2016   Allergic rhinitis caused by mold 10/18/2016   Ganglion cyst of finger of right hand 08/08/2016   Loss of transverse plantar arch 06/12/2016   Mass of joint of finger 04/16/2016   Pap smear abnormality of cervix with ASCUS favoring benign 02/08/2016   OSA (obstructive sleep apnea) 12/28/2015   Palpitations 08/05/2015   Genital herpes 07/27/2015   Osteoporosis 03/25/2015   Dry eye 03/25/2015   Hair loss 09/18/2014   Bunion of great toe 11/15/2012   DJD (degenerative joint disease) 11/11/2012   History of gout 11/11/2012   S/P left THA,  AA 09/12/2011   Hemorrhoids, external without complications 08/18/2011   Osteoarthritis of hip 03/14/2011   Renal cyst 12/15/2010   Adrenal nodule (HCC) 12/15/2010   Liver hemangioma 12/15/2010   Gall bladder disease     PCP: Pincus Sanes, MD  REFERRING PROVIDER: Benancio Deeds, MD   REFERRING DIAG:  R19.4 (ICD-10-CM) - Altered bowel habits  R15.9 (ICD-10-CM) - Incontinence of feces, unspecified fecal incontinence type  R32 (ICD-10-CM) - Urinary incontinence, unspecified type    THERAPY DIAG:  Muscle weakness (generalized)  Other lack of coordination  Rationale for Evaluation and Treatment: Rehabilitation  ONSET DATE: 2020  SUBJECTIVE:                                                                                                                                                                                            SUBJECTIVE STATEMENT: Doing well.    PAIN:  Are you having pain? No   PRECAUTIONS: Other: cancer  WEIGHT BEARING RESTRICTIONS: No  FALLS:  Has patient fallen in last 6 months? No  LIVING ENVIRONMENT: Lives with: lives alone  OCCUPATION: retired, Airline pilot  PLOF: Independent  PATIENT GOALS: get the leakage under control  PERTINENT HISTORY:  papillary thyroid carcinoma , IC, anterior hip replacement on left   BOWEL MOVEMENT: Pain with bowel movement: No Type of bowel movement:Type (Bristol Stool Scale) type 1 and type 2 combo or Type 4, Frequency daily, Strain No, and Splinting no Fully empty rectum: No, stool size is not as big as it used to Leakage: Yes: mucous will leak with gas being released,  Fiber supplement: No  URINATION: Pain with urination: No Fully empty bladder: Yes:   Stream: Strong Urgency: Yes: uses the pelvic floor contraction to delay Frequency: Nighttime voids 0-1; daytime voids every few hours Leakage: Walking to the bathroom and Bending forward Pads: Yes: pantyliner  INTERCOURSE: not active  PREGNANCY: Vaginal deliveries 1 Tearing Yes: episiotomy    OBJECTIVE:   DIAGNOSTIC FINDINGS:  none   COGNITION: Overall cognitive status: Within functional limits for tasks assessed     SENSATION: Light touch: Appears intact Proprioception: Appears intact   POSTURE: No Significant postural limitations  PELVIC ALIGNMENT: Pelvic alignment is correct  LUMBARAROM/PROM: Lumbar ROM is full    LOWER EXTREMITY ROM: bilateral hip ROM is full   LOWER EXTREMITY MMT:  MMT Right eval Left eval  Hip abduction 4/5 4/5  Hip adduction 4/5 4/5   PALPATION:   General  Difficulty with fully engaging her lower abdominal.                 External Perineal Exam  dryness                              Internal Pelvic Floor  tightness on the sides of the introitus and anterior of the anal canal.   Patient confirms identification and approves PT to assess internal pelvic floor and treatment Yes  PELVIC MMT:   MMT eval 10/09/22  Vaginal 3/5 anterior and posterior, 2/5 on the sides 3/5 with circular contraction   Internal Anal Sphincter 3/5 except for anterior is 2/5   External Anal Sphincter 3/5 except for anterior is 2/5   Puborectalis 3/5   (Blank rows = not tested)        TONE: average  PROLAPSE: none  TODAY'S TREATMENT:     10/09/22 Manual: Myofascial release: Urogenital diaphragm release going through the different layers of fascia Internal pelvic floor techniques: No emotional/communication barriers or cognitive limitation. Patient is motivated to learn. Patient understands and agrees with treatment goals and plan. PT explains patient will be examined in standing, sitting, and lying down to see how their muscles and joints work. When they are ready, they will be asked to remove their underwear so PT can examine their perineum. The patient is also given the option of providing their own chaperone as one is not provided in our facility. The patient also has the right and is explained the right to defer or refuse any part of the evaluation or treatment including the internal exam. With the patient's consent, PT will use one gloved finger to gently assess the muscles of the pelvic floor, seeing how well it contracts and relaxes and if there is muscle symmetry. After, the patient will get dressed and PT and patient will discuss exam findings and plan of care. PT and patient discuss plan of care, schedule, attendance policy and HEP activities.  Going through the vagina working on the pubovaginalis and puborectalis and sides of the introitus to improve circular contraction Neuromuscular re-education: Pelvic floor contraction training: Therapist finger in the vaginal canal working on pelvic floor contraction with  a lift Exercises: Strengthening: Bridge with ball squeeze with pelvic floor contraction 10x  Supine press ball into knees with pelvic floor and abdominal contraction 10 x each leg Dead bug 10 x each side then placed a red band around the knees and held 2 # doing it 15 x each side Sitting pelvic floor contraction holding 10 sec 10 x and quick flicks   PATIENT EDUCATION:  10/09/22 Education details: Access Code: [email or web address], educated patient on vaginal moisturizers and how to apply Person educated: Patient Education method: Programmer, multimedia, Demonstration, Tactile cues, Verbal cues, and Handouts Education comprehension: verbalized understanding, returned demonstration, verbal cues required, tactile cues required, and needs further education  HOME EXERCISE PROGRAM: 10/09/22 Access Code: [email or web address] URL: [email or web address] Date: 10/09/2022 Prepared by: Eulis Foster  Exercises - Hooklying Transversus Abdominis Palpation  - 1 x daily - 7 x weekly - 1 sets - 10 reps - Hooklying Isometric Hip Flexion  - 1 x daily - 3 x weekly - 2 sets - 10 reps - 2 sec hold - Dead Bug  - 1 x daily - 3 x weekly - 2 sets - 10 reps - Seated Pelvic Floor Contraction  - 3 x daily - 7 x weekly - 1 sets - 10 reps - 10 sec hold - Seated Quick Flick Pelvic Floor Contractions  - 3 x daily - 7 x weekly - 1 sets - 15  reps   ASSESSMENT:  CLINICAL IMPRESSION: Patient is a 72 y.o. female who was seen today for physical therapy treatment for urinary and fecal incontinence. Pelvic floor strength increased to 35 with good hug of therapist finger. She is able to contract her lower abdominals better. Patient will benefit from skilled therapy to improve pelvic floor strength and coordination to reduce leakage.   OBJECTIVE IMPAIRMENTS: decreased coordination, decreased strength, and increased fascial restrictions.   ACTIVITY LIMITATIONS: bending, continence, toileting, and locomotion level  PARTICIPATION LIMITATIONS:  community activity  PERSONAL FACTORS: Age and 3+ comorbidities: papillary thyroid carcinoma , IC, anterior hip replacement on left  are also affecting patient's functional outcome.   REHAB POTENTIAL: Excellent  CLINICAL DECISION MAKING: Stable/uncomplicated  EVALUATION COMPLEXITY: Low   GOALS: Goals reviewed with patient? Yes  [place] TERM GOALS: Target date: 10/29/22  Patient independent with initial pelvic floor and core exercises.  Baseline: Goal status: INITIAL  2.  Patient educated on vaginal moisturizers and how to apply correctly.  Baseline:  Goal status: INITIAL   LONG TERM GOALS: Target date: 12/25/22  Patient independent with advanced HEP to improve pelvic floor and core strength to reduce urinary leakage.  Baseline:  Goal status: INITIAL  2.  Patient reports the fecal/mucous leakage has decreased >/= 50% due to improved circular contraction and strength >/= 4/5.  Baseline:  Goal status: INITIAL  3.  Patient is able to bend forward and walk to the bathroom without urinary leakage due to pelvic floor strength >/= 3/5 holding 10 sec.  Baseline:  Goal status: INITIAL   PLAN:  PT FREQUENCY: 1x/week  PT DURATION: 12 weeks  PLANNED INTERVENTIONS: Therapeutic exercises, Therapeutic activity, Neuromuscular re-education, Patient/Family education, Dry Needling, Electrical stimulation, Taping, Biofeedback, and Manual therapy  PLAN FOR NEXT SESSION: work  anterior rectal wall work on, lower abdominal contraction, moisturizers   Eulis Foster, PT 10/09/22 12:34 PM

## 2022-10-10 NOTE — Progress Notes (Unsigned)
    Subjective:    Patient ID: Patricia Conley, female    DOB: March 01, 1951, 72 y.o.   MRN: [id number]      HPI Kaile is here for No chief complaint on file.    Migraines headaches -     Medications and allergies reviewed with patient and updated if appropriate.  Current Outpatient Medications on File Prior to Visit  Medication Sig Dispense Refill   amitriptyline (ELAVIL) 10 MG tablet Take 1 tablet (10 mg total) by mouth at bedtime. 90 tablet 3   b complex vitamins capsule Take 1 capsule by mouth daily.     Biotin 1 MG CAPS Take by mouth.     levothyroxine (SYNTHROID) 100 MCG tablet Take by mouth.     Magnesium 500 MG CAPS Take 500 mg by mouth daily.     melatonin 3 MG TABS tablet Take 3 mg by mouth at bedtime.     valACYclovir (VALTREX) 500 MG tablet Take 1 tablet (500 mg total) by mouth daily. 90 tablet 1   vitamin C (ASCORBIC ACID) 500 MG tablet Take 500 mg by mouth daily.     No current facility-administered medications on file prior to visit.    Review of Systems     Objective:  There were no vitals filed for this visit. BP Readings from Last 3 Encounters:  09/28/22 116/64  09/01/22 110/74  08/30/22 122/76   Wt Readings from Last 3 Encounters:  09/28/22 138 lb 6.4 oz (62.8 kg)  09/01/22 142 lb (64.4 kg)  08/30/22 140 lb (63.5 kg)   There is no height or weight on file to calculate BMI.    Physical Exam         Assessment & Plan:    See Problem List for Assessment and Plan of chronic medical problems.

## 2022-10-11 ENCOUNTER — Ambulatory Visit (INDEPENDENT_AMBULATORY_CARE_PROVIDER_SITE_OTHER): Payer: Medicare Other

## 2022-10-11 ENCOUNTER — Encounter: Payer: Self-pay | Admitting: Internal Medicine

## 2022-10-11 ENCOUNTER — Ambulatory Visit (INDEPENDENT_AMBULATORY_CARE_PROVIDER_SITE_OTHER): Payer: Medicare Other | Admitting: Internal Medicine

## 2022-10-11 VITALS — BP 104/70 | HR 87 | Temp 98.4°F | Ht 66.0 in | Wt 139.1 lb

## 2022-10-11 DIAGNOSIS — J3489 Other specified disorders of nose and nasal sinuses: Secondary | ICD-10-CM

## 2022-10-11 DIAGNOSIS — R519 Headache, unspecified: Secondary | ICD-10-CM | POA: Diagnosis not present

## 2022-10-11 MED ORDER — AMOXICILLIN-POT CLAVULANATE 875-125 MG PO TABS: 1.0000 | ORAL_TABLET | Freq: Two times a day (BID) | ORAL | 0 refills | Status: AC

## 2022-10-11 MED ORDER — PREDNISONE 20 MG PO TABS: 40.0000 mg | ORAL_TABLET | Freq: Every day | ORAL | 0 refills | Status: AC

## 2022-10-11 NOTE — Assessment & Plan Note (Signed)
Acute Not a typical presentation for sinus infection, but several symptoms consistent with a possible sinus infection She has had sinus surgery in the past which might help explain the atypical presentation Will check sinus x-ray-discussed this is limited but is something that we can do today Start Augmentin 875-125 mg twice daily x 10 days Prednisone 40 mg daily x 5 days Call if no improvement

## 2022-10-11 NOTE — Patient Instructions (Addendum)
      Have an xray downstairs     Medications changes include :   Augmentin twice daily, prednisone      Return if symptoms worsen or fail to improve.

## 2022-10-11 NOTE — Assessment & Plan Note (Signed)
Acute Acute onset while exercising, but headache has lingered and has been intractable despite taking several over-the-counter medications Also with some sleepiness symptoms Possible sinus infection Most likely activity headache Prednisone 40 mg daily x 5 days Start Augmentin 875-125 mg twice daily x 10 days She will let me know if her symptoms or not improving

## 2022-10-12 ENCOUNTER — Encounter: Payer: Self-pay | Admitting: Internal Medicine

## 2022-10-12 DIAGNOSIS — R519 Headache, unspecified: Secondary | ICD-10-CM

## 2022-10-12 NOTE — Addendum Note (Signed)
Addended by: Pincus Sanes on: 10/12/2022 08:58 PM   Modules accepted: Orders

## 2022-10-14 ENCOUNTER — Encounter: Payer: Self-pay | Admitting: Internal Medicine

## 2022-10-14 DIAGNOSIS — R519 Headache, unspecified: Secondary | ICD-10-CM

## 2022-10-16 ENCOUNTER — Ambulatory Visit (HOSPITAL_BASED_OUTPATIENT_CLINIC_OR_DEPARTMENT_OTHER)
Admission: RE | Admit: 2022-10-16 | Discharge: 2022-10-16 | Disposition: A | Discharge status: Home / Self Care | Payer: Medicare Other | Source: Ambulatory Visit | Attending: Internal Medicine | Admitting: Internal Medicine

## 2022-10-16 ENCOUNTER — Encounter: Payer: Self-pay | Admitting: Internal Medicine

## 2022-10-16 DIAGNOSIS — R519 Headache, unspecified: Secondary | ICD-10-CM | POA: Insufficient documentation

## 2022-10-18 ENCOUNTER — Telehealth: Payer: Self-pay

## 2022-10-18 ENCOUNTER — Ambulatory Visit: Payer: Medicare Other | Admitting: Physical Therapy

## 2022-10-18 ENCOUNTER — Encounter: Payer: Self-pay | Admitting: Physical Therapy

## 2022-10-18 DIAGNOSIS — M6281 Muscle weakness (generalized): Secondary | ICD-10-CM | POA: Diagnosis not present

## 2022-10-18 DIAGNOSIS — R278 Other lack of coordination: Secondary | ICD-10-CM

## 2022-10-18 NOTE — Therapy (Signed)
OUTPATIENT PHYSICAL THERAPY FEMALE PELVIC TREATMENT   Patient Name: Patricia Conley MRN: [id number] DOB:Oct 26, 1950, 72 y.o., female Today's Date: 10/18/2022  END OF SESSION:  PT End of Session - 10/18/22 1104     Visit Number 3    Date for PT Re-Evaluation 12/25/22    Authorization Type Medicare    Authorization - Visit Number 3    Authorization - Number of Visits 10    PT Start Time 1100    PT Stop Time 1140    PT Time Calculation (min) 40 min    Activity Tolerance Patient tolerated treatment well    Behavior During Therapy WFL for tasks assessed/performed             Past Medical History:  Diagnosis Date   Acetabular labrum tear    right/ per pt history   Aneurysm of ascending aorta without rupture (HCC)    Arthritis    Bradycardia    Colon polyps    DJD (degenerative joint disease)    EDS (Ehlers-Danlos syndrome)    Gall bladder disease    per pt report   Ganglion cyst    GERD (gastroesophageal reflux disease)    Hemorrhoids    History of colon polyps 07/2019   tubular adenoma   Low back pain 08/20/2016   Menopause    OSA (obstructive sleep apnea)    Osteoporosis    Pseudoaneurysm of carotid artery (HCC)    Sleep apnea    Telogen effluvium    Thyroid cancer (HCC) 2023   Thyroid disease    Past Surgical History:  Procedure Laterality Date   BREAST SURGERY  04/24/2000   cyst on Left breast   CYSTECTOMY  04/24/1997   Right elbow   HEMORRHOID BANDING  07/24/2014   JOINT REPLACEMENT     KNEE ARTHROSCOPY Left 09/10/2013   Procedure: LEFT MEDIAL AND LATERAL KNEE ARTHROSCOPY WITH MENISCAL DEBRIDEMENT ;  Surgeon: Loanne Drilling, MD;  Location: [facility];  Service: Orthopedics;  Laterality: Left;   NASAL SINUS SURGERY  1995, 2002   THYROIDECTOMY  02/2022   thyroid cancer   TOTAL HIP ARTHROPLASTY  09/12/2011   Procedure: TOTAL HIP ARTHROPLASTY ANTERIOR APPROACH;  Surgeon: [name], MD;  Location: [facility];  Service: Orthopedics;  Laterality: Left;    Patient Active Problem List   Diagnosis Date Noted   Sinus pain 10/11/2022   Headache 10/11/2022   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   EDS (Ehlers-Danlos syndrome) 01/26/2022   Carotid aneurysm, 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   Papillary thyroid carcinoma (HCC) 12/15/2021   Aneurysm of ascending aorta without rupture (HCC) 12/15/2021   Prediabetes 04/12/2021   Epidermoid cyst of finger of left hand 03/24/2021   Urinary frequency 10/05/2020   Myalgia 10/05/2020   Arthralgia 10/05/2020   Fatigue 10/04/2020   Cervical lymphadenopathy 04/16/2019   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   Burning sensation of skin, scalp 05/15/2017   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   Xerostomia 10/18/2016   Allergic rhinitis caused by mold 10/18/2016   Ganglion cyst of finger of right hand 08/08/2016   Loss of transverse plantar arch 06/12/2016   Mass of joint of finger 04/16/2016   Pap smear abnormality of cervix with ASCUS favoring benign 02/08/2016   OSA (obstructive sleep apnea) 12/28/2015   Palpitations 08/05/2015   Genital herpes 07/27/2015   Osteoporosis 03/25/2015   Dry eye 03/25/2015   Hair loss 09/18/2014   Bunion of great toe 11/15/2012   DJD (degenerative joint disease) 11/11/2012  History of gout 11/11/2012   S/P left THA, AA 09/12/2011   Hemorrhoids, external without complications 08/18/2011   Osteoarthritis of hip 03/14/2011   Renal cyst 12/15/2010   Adrenal nodule (HCC) 12/15/2010   Liver hemangioma 12/15/2010   Gall bladder disease     PCP: Pincus Sanes, MD  REFERRING PROVIDER: Benancio Deeds, MD   REFERRING DIAG:  R19.4 (ICD-10-CM) - Altered bowel habits  R15.9 (ICD-10-CM) - Incontinence of feces, unspecified fecal incontinence type  R32 (ICD-10-CM) - Urinary incontinence, unspecified type    THERAPY DIAG:  Muscle weakness (generalized)  Other lack of coordination  Rationale for Evaluation and Treatment: Rehabilitation  ONSET DATE: 2020  SUBJECTIVE:                                                                                                                                                                                            SUBJECTIVE STATEMENT: Hard to do the exercises due to having a migraine for 12 days. One day I had urinary and fecal leakage and could be  the medication they gave me. I do not think if I fully eliminate my stool.   PAIN:  Are you having pain? No   PRECAUTIONS: Other: cancer  WEIGHT BEARING RESTRICTIONS: No  FALLS:  Has patient fallen in last 6 months? No  LIVING ENVIRONMENT: Lives with: lives alone  OCCUPATION: retired, Airline pilot  PLOF: Independent  PATIENT GOALS: get the leakage under control  PERTINENT HISTORY:  papillary thyroid carcinoma , IC, anterior hip replacement on left   BOWEL MOVEMENT: Pain with bowel movement: No Type of bowel movement:Type (Bristol Stool Scale) type 1 and type 2 combo or Type 4, Frequency daily, Strain No, and Splinting no Fully empty rectum: No, stool size is not as big as it used to Leakage: Yes: mucous will leak with gas being released,  Fiber supplement: No  URINATION: Pain with urination: No Fully empty bladder: Yes:   Stream: Strong Urgency: Yes: uses the pelvic floor contraction to delay Frequency: Nighttime voids 0-1; daytime voids every few hours Leakage: Walking to the bathroom and Bending forward Pads: Yes: pantyliner  INTERCOURSE: not active  PREGNANCY: Vaginal deliveries 1 Tearing Yes: episiotomy    OBJECTIVE:   DIAGNOSTIC FINDINGS:  none   COGNITION: Overall cognitive status: Within functional limits for tasks assessed     SENSATION: Light touch: Appears intact Proprioception: Appears intact   POSTURE: No Significant postural limitations  PELVIC ALIGNMENT: Pelvic alignment is correct  LUMBARAROM/PROM: Lumbar ROM is full    LOWER EXTREMITY ROM: bilateral hip ROM is full   LOWER EXTREMITY MMT:  MMT Right  eval Left eval   Hip abduction 4/5 4/5  Hip adduction 4/5 4/5   PALPATION:   General  Difficulty with fully engaging her lower abdominal.                 External Perineal Exam dryness                              Internal Pelvic Floor tightness on the sides of the introitus and anterior of the anal canal.   Patient confirms identification and approves PT to assess internal pelvic floor and treatment Yes  PELVIC MMT:   MMT eval 10/09/22 10/18/22  Vaginal 3/5 anterior and posterior, 2/5 on the sides 3/5 with circular contraction    Internal Anal Sphincter 3/5 except for anterior is 2/5  4/5  External Anal Sphincter 3/5 except for anterior is 2/5  4/5  Puborectalis 3/5  4/5  (Blank rows = not tested)        TONE: average  PROLAPSE: none  TODAY'S TREATMENT:     10/18/22 Manual: Soft tissue mobilization: Manual work to the perineal body and outside the rectum to release the tissue Internal pelvic floor techniques: No emotional/communication barriers or cognitive limitation. Patient is motivated to learn. Patient understands and agrees with treatment goals and plan. PT explains patient will be examined in standing, sitting, and lying down to see how their muscles and joints work. When they are ready, they will be asked to remove their underwear so PT can examine their perineum. The patient is also given the option of providing their own chaperone as one is not provided in our facility. The patient also has the right and is explained the right to defer or refuse any part of the evaluation or treatment including the internal exam. With the patient's consent, PT will use one gloved finger to gently assess the muscles of the pelvic floor, seeing how well it contracts and relaxes and if there is muscle symmetry. After, the patient will get dressed and PT and patient will discuss exam findings and plan of care. PT and patient discuss plan of care, schedule, attendance policy and HEP activities.  Going through  the rectum working on the puborectalis and obturator internist to elongate for contraction Neuromuscular re-education: Pelvic floor contraction training: Therapist finger in the rectum working on pelvic floor contraction rectally with lift Exercises: Strengthening: Sitting anal contraction holding 20 sec Sitting anal contraction with 5 quick flicks Attempted dead bug but increased her headache so stopped  10/09/22 Manual: Myofascial release: Urogenital diaphragm release going through the different layers of fascia Internal pelvic floor techniques: No emotional/communication barriers or cognitive limitation. Patient is motivated to learn. Patient understands and agrees with treatment goals and plan. PT explains patient will be examined in standing, sitting, and lying down to see how their muscles and joints work. When they are ready, they will be asked to remove their underwear so PT can examine their perineum. The patient is also given the option of providing their own chaperone as one is not provided in our facility. The patient also has the right and is explained the right to defer or refuse any part of the evaluation or treatment including the internal exam. With the patient's consent, PT will use one gloved finger to gently assess the muscles of the pelvic floor, seeing how well it contracts and relaxes and if there is muscle symmetry. After, the patient will get dressed and  PT and patient will discuss exam findings and plan of care. PT and patient discuss plan of care, schedule, attendance policy and HEP activities.  Going through the vagina working on the pubovaginalis and puborectalis and sides of the introitus to improve circular contraction Neuromuscular re-education: Pelvic floor contraction training: Therapist finger in the vaginal canal working on pelvic floor contraction with a lift Exercises: Strengthening: Bridge with ball squeeze with pelvic floor contraction 10x  Supine press  ball into knees with pelvic floor and abdominal contraction 10 x each leg Dead bug 10 x each side then placed a red band around the knees and held 2 # doing it 15 x each side Sitting pelvic floor contraction holding 10 sec 10 x and quick flicks   PATIENT EDUCATION:  10/18/22 Education details: Access Code: [email or web address], educated patient on vaginal moisturizers and how to apply Person educated: Patient Education method: Programmer, multimedia, Demonstration, Tactile cues, Verbal cues, and Handouts Education comprehension: verbalized understanding, returned demonstration, verbal cues required, tactile cues required, and needs further education  HOME EXERCISE PROGRAM: 10/18/22 Access Code: [email or web address] URL: [email or web address] Date: 10/18/2022 Prepared by: Eulis Foster  Exercises - Seated Pelvic Floor Contraction  - 3 x daily - 7 x weekly - 1 sets - 10 reps - 10 sec hold - Seated Quick Flick Pelvic Floor Contractions  - 3 x daily - 7 x weekly - 1 sets - 15 reps  ASSESSMENT:  CLINICAL IMPRESSION: Patient is a 72 y.o. female who was seen today for physical therapy treatment for urinary and fecal incontinence. Pelvic floor  strength for rectum increased to 4/5  with good lift and hug of therapist finger. Patient is able to feel the lift with the rectum. Patient will focus on the pelvic floor contraction and other exercises due to the exercises cause increase in headache. Patient will benefit from skilled therapy to improve pelvic floor strength and coordination to reduce leakage.   OBJECTIVE IMPAIRMENTS: decreased coordination, decreased strength, and increased fascial restrictions.   ACTIVITY LIMITATIONS: bending, continence, toileting, and locomotion level  PARTICIPATION LIMITATIONS: community activity  PERSONAL FACTORS: Age and 3+ comorbidities: papillary thyroid carcinoma , IC, anterior hip replacement on left  are also affecting patient's functional outcome.   REHAB POTENTIAL:  Excellent  CLINICAL DECISION MAKING: Stable/uncomplicated  EVALUATION COMPLEXITY: Low   GOALS: Goals reviewed with patient? Yes  [place] TERM GOALS: Target date: 10/29/22  Patient independent with initial pelvic floor and core exercises.  Baseline: Goal status: Met 10/18/22  2.  Patient educated on vaginal moisturizers and how to apply correctly.  Baseline:  Goal status: Met 10/18/22   LONG TERM GOALS: Target date: 12/25/22  Patient independent with advanced HEP to improve pelvic floor and core strength to reduce urinary leakage.  Baseline:  Goal status: INITIAL  2.  Patient reports the fecal/mucous leakage has decreased >/= 50% due to improved circular contraction and strength >/= 4/5.  Baseline:  Goal status: INITIAL  3.  Patient is able to bend forward and walk to the bathroom without urinary leakage due to pelvic floor strength >/= 3/5 holding 10 sec.  Baseline:  Goal status: INITIAL   PLAN:  PT FREQUENCY: 1x/week  PT DURATION: 12 weeks  PLANNED INTERVENTIONS: Therapeutic exercises, Therapeutic activity, Neuromuscular re-education, Patient/Family education, Dry Needling, Electrical stimulation, Taping, Biofeedback, and Manual therapy  PLAN FOR NEXT SESSION:  lower abdominal contraction and pelvic floor exercise being careful of not producing a headache.   Eulis Foster, PT 10/18/22 11:05 AM

## 2022-10-18 NOTE — Transitions of Care (Post Inpatient/ED Visit) (Signed)
   10/18/2022  Name: Patricia Conley MRN: [id number] DOB: [birth year]  Today's TOC FU Call Status: Today's TOC FU Call Status:: Unsuccessul Call (1st Attempt) Unsuccessful Call (1st Attempt) Date: 10/18/22  Attempted to reach the patient regarding the most recent Inpatient/ED visit.  Follow Up Plan: Additional outreach attempts will be made to reach the patient to complete the Transitions of Care (Post Inpatient/ED visit) call.   Signature Karena Addison, LPN [facility] Nurse Health Advisor Direct Dial [phone]

## 2022-10-19 ENCOUNTER — Encounter: Payer: Self-pay | Admitting: Internal Medicine

## 2022-10-20 ENCOUNTER — Ambulatory Visit (INDEPENDENT_AMBULATORY_CARE_PROVIDER_SITE_OTHER): Payer: Medicare Other | Admitting: Internal Medicine

## 2022-10-20 ENCOUNTER — Encounter: Payer: Self-pay | Admitting: Internal Medicine

## 2022-10-20 VITALS — BP 120/80 | HR 82 | Temp 98.5°F | Ht 66.0 in | Wt 136.0 lb

## 2022-10-20 DIAGNOSIS — R519 Headache, unspecified: Secondary | ICD-10-CM | POA: Diagnosis not present

## 2022-10-20 LAB — COMPREHENSIVE METABOLIC PANEL
ALT: 17 U/L (ref 0–35)
AST: 19 U/L (ref 0–37)
Albumin: 4.5 g/dL (ref 3.5–5.2)
Alkaline Phosphatase: 57 U/L (ref 39–117)
BUN: 18 mg/dL (ref 6–23)
CO2: 29 mEq/L (ref 19–32)
Calcium: 9.3 mg/dL (ref 8.4–10.5)
Chloride: 101 mEq/L (ref 96–112)
Creatinine, Ser: 0.77 mg/dL (ref 0.40–1.20)
GFR: 78.57 mL/min (ref >60.00)
Glucose, Bld: 100 mg/dL — ABNORMAL HIGH (ref 70–99)
Potassium: 4 mEq/L (ref 3.5–5.1)
Sodium: 138 mEq/L (ref 135–145)
Total Bilirubin: 0.8 mg/dL (ref 0.2–1.2)
Total Protein: 6.9 g/dL (ref 6.0–8.3)

## 2022-10-20 LAB — CBC WITH DIFFERENTIAL/PLATELET
Basophils Absolute: 0 10*3/uL (ref 0.0–0.1)
Basophils Relative: 0.5 % (ref 0.0–3.0)
Eosinophils Absolute: 0.1 10*3/uL (ref 0.0–0.7)
Eosinophils Relative: 1.2 % (ref 0.0–5.0)
HCT: 41.1 % (ref 36.0–46.0)
Hemoglobin: 13.3 g/dL (ref 12.0–15.0)
Lymphocytes Relative: 24.2 % (ref 12.0–46.0)
Lymphs Abs: 1.9 10*3/uL (ref 0.7–4.0)
MCHC: 32.4 g/dL (ref 30.0–36.0)
MCV: 88.7 fl (ref 78.0–100.0)
Monocytes Absolute: 0.4 10*3/uL (ref 0.1–1.0)
Monocytes Relative: 5.5 % (ref 3.0–12.0)
Neutro Abs: 5.3 10*3/uL (ref 1.4–7.7)
Neutrophils Relative %: 68.6 % (ref 43.0–77.0)
Platelets: 309 10*3/uL (ref 150.0–400.0)
RBC: 4.63 Mil/uL (ref 3.87–5.11)
RDW: 13.4 % (ref 11.5–15.5)
WBC: 7.7 10*3/uL (ref 4.0–10.5)

## 2022-10-20 LAB — C-REACTIVE PROTEIN: CRP: <1 mg/dL (ref 0.5–20.0)

## 2022-10-20 LAB — SEDIMENTATION RATE: Sed Rate: 5 mm/hr (ref 0–30)

## 2022-10-20 MED ORDER — DOXYCYCLINE HYCLATE 100 MG PO TABS: 100.0000 mg | ORAL_TABLET | Freq: Two times a day (BID) | ORAL | 0 refills | Status: AC

## 2022-10-20 NOTE — Assessment & Plan Note (Addendum)
Subacute Last week we treated her for possible sinus infection-she did have some sinus pressure and teeth pain along with a headache Headache was different type of headache and had a different type of onset-acute onset while exercising Ddx for headache include: sinus, TMJ, temporal arteritis, migraine Some improvement of her symptoms with prednisone, Augmentin.  Still has some yellow mucus coming out of her left nostril also could have a sinus infection still She will keep doing the sinus rinses and see how she does over the weekend If discolored mucus persistent headache does not resolve she will start doxycycline 100 mg twice daily x 7 days Will check CBC, CMP, CRP and ESR for temporal arteritis-temple is nontender and she has no other symptoms consistent with temporal arteritis, but will do this to be on the safe side Possible migraine She has a prescription for Imitrex that she can try and has an appointment in a couple weeks with neurology TMJ, clenching-this is very possible Advised that she restart CPAP as soon as possible which hopefully will help She will update me if no improvement

## 2022-10-20 NOTE — Patient Instructions (Addendum)
      Blood work was ordered.       Medications changes include :   doxycycline 100 mg twice daily for 7 days      Return if symptoms worsen or fail to improve.

## 2022-10-20 NOTE — Progress Notes (Signed)
Subjective:    Patient ID: Patricia Conley, female    DOB: 1951-02-24, 72 y.o.   MRN: [id number]      HPI Raiah is here for No chief complaint on file.   In [facility] ED - treated in ED for intractable migraine with cocktail of medication - headache was better after and did resolve for a while.    When she wakes up in the morning has yellow mucus from the left sinus.  She does sinus rinses.  She blows her nose initially and it is clear.  Later she will get dark yellow mucus from her left nostril.  It only comes out in the morning.  She has had sinus infection in the past that was severe and was similar to this, but it does not make a lot of sense.  The headache is pressure in the left temple and is mild. It is much better than last week.     She does have some migraine headaches.  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  She has not been using her CPAP recently with all of this infection.  The headache seems to be getting little bit worse since she was in the emergency room and had the migraine cocktail.  She does have an appointment to see neurology regarding her headaches.  Medications and allergies reviewed with patient and updated if appropriate.  Current Outpatient Medications on File Prior to Visit  Medication Sig Dispense Refill   amitriptyline (ELAVIL) 10 MG tablet Take 1 tablet (10 mg total) by mouth at bedtime. 90 tablet 3   b complex vitamins capsule Take 1 capsule by mouth daily.     Biotin 1 MG CAPS Take by mouth.     levothyroxine (SYNTHROID) 100 MCG tablet Take by mouth.     Magnesium 500 MG CAPS Take 500 mg by mouth daily.     melatonin 3 MG TABS tablet Take 3 mg by mouth at bedtime.     valACYclovir (VALTREX) 500 MG tablet Take 1 tablet (500 mg total) by mouth daily. 90 tablet 1   vitamin C (ASCORBIC ACID) 500 MG tablet Take 500 mg by mouth daily.     No current facility-administered medications on file prior to visit.    Review of Systems   Constitutional:  Positive for fatigue. Negative for appetite change, chills and fever.  HENT:  Negative for congestion, postnasal drip and sore throat.        Tooth pain and sinus pain has resolved  Eyes:  Negative for visual disturbance.  Respiratory:  Negative for cough, shortness of breath and wheezing.   Gastrointestinal:  Negative for nausea.  Neurological:  Positive for headaches (left temple).       Objective:   Vitals:   10/20/22 1438  BP: 120/80  Pulse: 82  Temp: 98.5 F (36.9 C)  SpO2: 97%   BP Readings from Last 3 Encounters:  10/20/22 120/80  10/11/22 104/70  09/28/22 116/64   Wt Readings from Last 3 Encounters:  10/20/22 136 lb (61.7 kg)  10/11/22 139 lb 2 oz (63.1 kg)  09/28/22 138 lb 6.4 oz (62.8 kg)   Body mass index is 21.95 kg/m.    Physical Exam Constitutional:      General: She is not in acute distress.    Appearance: Normal appearance. She is not ill-appearing.  HENT:     Head: Normocephalic and atraumatic.     Right Ear: Tympanic membrane, ear canal and  external ear normal.     Left Ear: Tympanic membrane, ear canal and external ear normal.     Mouth/Throat:     Mouth: Mucous membranes are moist.     Pharynx: No oropharyngeal exudate or posterior oropharyngeal erythema.  Eyes:     Conjunctiva/sclera: Conjunctivae normal.  Cardiovascular:     Rate and Rhythm: Normal rate and regular rhythm.  Pulmonary:     Effort: Pulmonary effort is normal. No respiratory distress.     Breath sounds: Normal breath sounds. No wheezing or rales.  Musculoskeletal:        General: No tenderness (No tenderness with palpation left temple-states it was under previously).     Cervical back: Neck supple. No tenderness.  Lymphadenopathy:     Cervical: No cervical adenopathy.  Skin:    General: Skin is warm and dry.  Neurological:     Mental Status: She is alert.            Assessment & Plan:    See Problem List for Assessment and Plan of chronic  medical problems.

## 2022-10-23 ENCOUNTER — Encounter (HOSPITAL_BASED_OUTPATIENT_CLINIC_OR_DEPARTMENT_OTHER): Payer: Self-pay | Admitting: Pulmonary Disease

## 2022-10-23 NOTE — Transitions of Care (Post Inpatient/ED Visit) (Signed)
   10/23/2022  Name: Patricia Conley MRN: [id number] DOB: 12-29-1950  Today's TOC FU Call Status: Today's TOC FU Call Status:: Successful TOC FU Call Competed Unsuccessful Call (1st Attempt) Date: 10/18/22 [facility] FU Call Complete Date: 10/23/22  Transition Care Management Follow-up Telephone Call Date of Discharge: 10/17/22 Discharge Facility: Other ([facility]) Name of Other ([facility]) Discharge Facility: [facility] Type of Discharge: Emergency Department Reason for ED Visit: Other: (headache) How have you been since you were released from the hospital?: Same Any questions or concerns?: No  Items Reviewed: Did you receive and understand the discharge instructions provided?: No Medications obtained,verified, and reconciled?: Yes (Medications Reviewed) Any new allergies since your discharge?: No Dietary orders reviewed?: Yes Do you have support at home?: No  Medications Reviewed Today: Medications Reviewed Today     Reviewed by Karena Addison, LPN (Licensed Practical Nurse) on 10/23/22 at 1641  Med List Status: <None>   Medication Order Taking? Sig Documenting Provider Last Dose Status Informant  amitriptyline (ELAVIL) 10 MG tablet [id number] No Take 1 tablet (10 mg total) by mouth at bedtime. Pincus Sanes, MD Taking Active   b complex vitamins capsule [id number] No Take 1 capsule by mouth daily. [provider] Taking Active Self  Biotin 1 MG CAPS [id number] No Take by mouth. [provider] Taking Active Self  doxycycline (VIBRA-TABS) 100 MG tablet [id number]  Take 1 tablet (100 mg total) by mouth 2 (two) times daily for 7 days. Pincus Sanes, MD  Active   levothyroxine (SYNTHROID) 100 MCG tablet [id number] No Take by mouth. [provider] Taking Active   Magnesium 500 MG CAPS [id number] No Take 500 mg by mouth daily. [provider] Taking Active Self  melatonin 3 MG TABS tablet [id number] No Take 3 mg by mouth at bedtime. [provider] Taking Active   valACYclovir (VALTREX) 500 MG tablet [id number] No Take 1 tablet (500 mg total) by mouth daily. Pincus Sanes, MD Taking Active   vitamin C (ASCORBIC ACID) 500 MG tablet [id number] No Take 500 mg by mouth daily. [provider] Taking Active Self            Home Care and Equipment/Supplies: Were Home Health Services Ordered?: NA Any new equipment or medical supplies ordered?: NA  Functional Questionnaire: Do you need assistance with bathing/showering or dressing?: No Do you need assistance with meal preparation?: No Do you need assistance with eating?: No Do you have difficulty maintaining continence: No Do you need assistance with getting out of bed/getting out of a chair/moving?: No Do you have difficulty managing or taking your medications?: No  Follow up appointments reviewed: PCP Follow-up appointment confirmed?: NA Specialist Hospital Follow-up appointment confirmed?: NA Do you need transportation to your follow-up appointment?: No Do you understand care options if your condition(s) worsen?: Yes-patient verbalized understanding    SIGNATURE Karena Addison, LPN [facility] Nurse Health Advisor Direct Dial [phone]

## 2022-11-02 NOTE — Telephone Encounter (Signed)
Received the following message from patient:   "Dr Craige Cotta,   I wanted to update you on my present use of the CPAP machine.  After my appointment with you on 6/6 I replaced my mask on 6/13.  All was good until I started an intense headache on 6/17 that was 24/7.  On 6/19 Dr Lawerance Bach prescribed prednisone and augmentin to cover the headache and possible sinus infection. Due to the headache becoming more severe and not knowing if I had a sinus infection I ceased using the CPAP around that time.   My [facility] neurosurgeon ordered a contrast CT Scan on 6/25 at [facility] to rule out a bleeding aneurysm plus I was given a cocktail of 6 items to break the headache.  As of Friday 6/28 Dr Lawerance Bach gave me doxycycline since I had signs of dark yellow discharge from my left nostril plus she ordered blood tests for temporal arteritis.  Tests are normal.  I continue to have the intense left temple headache.  Will complete the antibiotic this Friday and return to Dr Lawerance Bach if needed.  I plan to start back on the CPAP tonight and hope all goes well. My purpose of this message is that I wanted to keep you updated plus I know Medicare will not cover the CPAP unless I use 21 of 30 days.  My head and sinuses need to be in a good place so I hope all goes well.  Is there any thing I need to do to keep the CPAP without being penalized?      Thank you, Denice Bors"  Will route to Dr. Craige Cotta as Lorain Childes.

## 2022-11-10 ENCOUNTER — Encounter: Payer: Self-pay | Admitting: Physical Therapy

## 2022-11-10 ENCOUNTER — Ambulatory Visit: Payer: Medicare Other | Attending: Gastroenterology | Admitting: Physical Therapy

## 2022-11-10 DIAGNOSIS — M6281 Muscle weakness (generalized): Secondary | ICD-10-CM | POA: Diagnosis present

## 2022-11-10 DIAGNOSIS — R278 Other lack of coordination: Secondary | ICD-10-CM | POA: Insufficient documentation

## 2022-11-10 NOTE — Therapy (Signed)
OUTPATIENT PHYSICAL THERAPY FEMALE PELVIC TREATMENT   Patient Name: Patricia Conley MRN: [id number] DOB:1951/03/07, 72 y.o., female Today's Date: 11/10/2022  END OF SESSION:  PT End of Session - 11/10/22 1016     Visit Number 4    Date for PT Re-Evaluation 12/25/22    Authorization Type Medicare    Authorization - Visit Number 4    Authorization - Number of Visits 10    PT Start Time 1015    PT Stop Time 1055    PT Time Calculation (min) 40 min    Activity Tolerance Patient tolerated treatment well    Behavior During Therapy WFL for tasks assessed/performed             Past Medical History:  Diagnosis Date   Acetabular labrum tear    right/ per pt history   Aneurysm of ascending aorta without rupture (HCC)    Arthritis    Bradycardia    Colon polyps    DJD (degenerative joint disease)    EDS (Ehlers-Danlos syndrome)    Gall bladder disease    per pt report   Ganglion cyst    GERD (gastroesophageal reflux disease)    Hemorrhoids    History of colon polyps 07/2019   tubular adenoma   Low back pain 08/20/2016   Menopause    OSA (obstructive sleep apnea)    Osteoporosis    Pseudoaneurysm of carotid artery (HCC)    Sleep apnea    Telogen effluvium    Thyroid cancer (HCC) 2023   Thyroid disease    Past Surgical History:  Procedure Laterality Date   BREAST SURGERY  04/24/2000   cyst on Left breast   CYSTECTOMY  04/24/1997   Right elbow   HEMORRHOID BANDING  07/24/2014   JOINT REPLACEMENT     KNEE ARTHROSCOPY Left 09/10/2013   Procedure: LEFT MEDIAL AND LATERAL KNEE ARTHROSCOPY WITH MENISCAL DEBRIDEMENT ;  Surgeon: Loanne Drilling, MD;  Location: [facility];  Service: Orthopedics;  Laterality: Left;   NASAL SINUS SURGERY  1995, 2002   THYROIDECTOMY  02/2022   thyroid cancer   TOTAL HIP ARTHROPLASTY  09/12/2011   Procedure: TOTAL HIP ARTHROPLASTY ANTERIOR APPROACH;  Surgeon: [name], MD;  Location: [facility];  Service: Orthopedics;  Laterality: Left;    Patient Active Problem List   Diagnosis Date Noted   Sinus pain 10/11/2022   Headache 10/11/2022   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   EDS (Ehlers-Danlos syndrome) 01/26/2022   Carotid aneurysm, 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   Papillary thyroid carcinoma (HCC) 12/15/2021   Aneurysm of ascending aorta without rupture (HCC) 12/15/2021   Prediabetes 04/12/2021   Epidermoid cyst of finger of left hand 03/24/2021   Urinary frequency 10/05/2020   Myalgia 10/05/2020   Arthralgia 10/05/2020   Fatigue 10/04/2020   Cervical lymphadenopathy 04/16/2019   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   Burning sensation of skin, scalp 05/15/2017   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   Xerostomia 10/18/2016   Allergic rhinitis caused by mold 10/18/2016   Ganglion cyst of finger of right hand 08/08/2016   Loss of transverse plantar arch 06/12/2016   Mass of joint of finger 04/16/2016   Pap smear abnormality of cervix with ASCUS favoring benign 02/08/2016   OSA (obstructive sleep apnea) 12/28/2015   Palpitations 08/05/2015   Genital herpes 07/27/2015   Osteoporosis 03/25/2015   Dry eye 03/25/2015   Hair loss 09/18/2014   Bunion of great toe 11/15/2012   DJD (degenerative joint disease) 11/11/2012  History of gout 11/11/2012   S/P left THA, AA 09/12/2011   Hemorrhoids, external without complications 08/18/2011   Osteoarthritis of hip 03/14/2011   Renal cyst 12/15/2010   Adrenal nodule (HCC) 12/15/2010   Liver hemangioma 12/15/2010   Gall bladder disease     PCP: Pincus Sanes, MD  REFERRING PROVIDER: Benancio Deeds, MD   REFERRING DIAG:  R19.4 (ICD-10-CM) - Altered bowel habits  R15.9 (ICD-10-CM) - Incontinence of feces, unspecified fecal incontinence type  R32 (ICD-10-CM) - Urinary incontinence, unspecified type    THERAPY DIAG:  Muscle weakness (generalized)  Other lack of coordination  Rationale for Evaluation and Treatment: Rehabilitation  ONSET DATE: 2020  SUBJECTIVE:                                                                                                                                                                                            SUBJECTIVE STATEMENT: Last 10 days I have not been consistent with my exercises. I had one time of fecal leakage since last visit while I was in the shower. I feel droplets coming out but stay in the labia area.    PAIN:  Are you having pain? No   PRECAUTIONS: Other: cancer  WEIGHT BEARING RESTRICTIONS: No  FALLS:  Has patient fallen in last 6 months? No  LIVING ENVIRONMENT: Lives with: lives alone  OCCUPATION: retired, Airline pilot  PLOF: Independent  PATIENT GOALS: get the leakage under control  PERTINENT HISTORY:  papillary thyroid carcinoma , IC, anterior hip replacement on left   BOWEL MOVEMENT: Pain with bowel movement: No Type of bowel movement:Type (Bristol Stool Scale) type 1 and type 2 combo or Type 4, Frequency daily, Strain No, and Splinting no Fully empty rectum: No, stool size is not as big as it used to Leakage: Yes: mucous will leak with gas being released,  Fiber supplement: No  URINATION: Pain with urination: No Fully empty bladder: Yes:   Stream: Strong Urgency: Yes: uses the pelvic floor contraction to delay Frequency: Nighttime voids 0-1; daytime voids every few hours Leakage: walking to the bathroom Pads: Yes: pantyliner  INTERCOURSE: not active  PREGNANCY: Vaginal deliveries 1 Tearing Yes: episiotomy    OBJECTIVE:   DIAGNOSTIC FINDINGS:  none   COGNITION: Overall cognitive status: Within functional limits for tasks assessed     SENSATION: Light touch: Appears intact Proprioception: Appears intact   POSTURE: No Significant postural limitations  PELVIC ALIGNMENT: Pelvic alignment is correct  LUMBARAROM/PROM: Lumbar ROM is full    LOWER EXTREMITY ROM: bilateral hip ROM is full   LOWER EXTREMITY MMT:  MMT Right eval Left eval  Hip abduction 4/5  4/5  Hip adduction 4/5 4/5   PALPATION:   General  Difficulty with fully engaging her lower abdominal.                 External Perineal Exam dryness                              Internal Pelvic Floor tightness on the sides of the introitus and anterior of the anal canal.   Patient confirms identification and approves PT to assess internal pelvic floor and treatment Yes  PELVIC MMT:   MMT eval 10/09/22 10/18/22 11/10/22  Vaginal 3/5 anterior and posterior, 2/5 on the sides 3/5 with circular contraction   4/5  Internal Anal Sphincter 3/5 except for anterior is 2/5  4/5   External Anal Sphincter 3/5 except for anterior is 2/5  4/5   Puborectalis 3/5  4/5   (Blank rows = not tested)        TONE: average  PROLAPSE: none  TODAY'S TREATMENT:    11/10/22 Manual: Internal pelvic floor techniques: No emotional/communication barriers or cognitive limitation. Patient is motivated to learn. Patient understands and agrees with treatment goals and plan. PT explains patient will be examined in standing, sitting, and lying down to see how their muscles and joints work. When they are ready, they will be asked to remove their underwear so PT can examine their perineum. The patient is also given the option of providing their own chaperone as one is not provided in our facility. The patient also has the right and is explained the right to defer or refuse any part of the evaluation or treatment including the internal exam. With the patient's consent, PT will use one gloved finger to gently assess the muscles of the pelvic floor, seeing how well it contracts and relaxes and if there is muscle symmetry. After, the patient will get dressed and PT and patient will discuss exam findings and plan of care. PT and patient discuss plan of care, schedule, attendance policy and HEP activities.  Going through the vaginal canal working on the introitus, pubovaginalis, puborectalis to elongate for improved  contraction Dry needling: Neuromuscular re-education: Core retraining: Core facilitation: Form correction: Pelvic floor contraction training: Therapist finger in the vaginal canal working on the pelvic floor contraction  Down training: Exercises: Stretches/mobility: Strengthening: Pelvic floor contraction in sitting holding for 10 sec 10 x  Sitting contract the pelvic floor while pressing on the thighs to also engage the lower abdomen 15 x Therapeutic activities: Functional strengthening activities:     10/18/22 Manual: Soft tissue mobilization: Manual work to the perineal body and outside the rectum to release the tissue Internal pelvic floor techniques: No emotional/communication barriers or cognitive limitation. Patient is motivated to learn. Patient understands and agrees with treatment goals and plan. PT explains patient will be examined in standing, sitting, and lying down to see how their muscles and joints work. When they are ready, they will be asked to remove their underwear so PT can examine their perineum. The patient is also given the option of providing their own chaperone as one is not provided in our facility. The patient also has the right and is explained the right to defer or refuse any part of the evaluation or treatment including the internal exam. With the patient's consent, PT will use one gloved finger to gently assess the muscles of the pelvic floor, seeing how well it  contracts and relaxes and if there is muscle symmetry. After, the patient will get dressed and PT and patient will discuss exam findings and plan of care. PT and patient discuss plan of care, schedule, attendance policy and HEP activities.  Going through the rectum working on the puborectalis and obturator internist to elongate for contraction Neuromuscular re-education: Pelvic floor contraction training: Therapist finger in the rectum working on pelvic floor contraction rectally with  lift Exercises: Strengthening: Sitting anal contraction holding 20 sec Sitting anal contraction with 5 quick flicks Attempted dead bug but increased her headache so stopped  10/09/22 Manual: Myofascial release: Urogenital diaphragm release going through the different layers of fascia Internal pelvic floor techniques: No emotional/communication barriers or cognitive limitation. Patient is motivated to learn. Patient understands and agrees with treatment goals and plan. PT explains patient will be examined in standing, sitting, and lying down to see how their muscles and joints work. When they are ready, they will be asked to remove their underwear so PT can examine their perineum. The patient is also given the option of providing their own chaperone as one is not provided in our facility. The patient also has the right and is explained the right to defer or refuse any part of the evaluation or treatment including the internal exam. With the patient's consent, PT will use one gloved finger to gently assess the muscles of the pelvic floor, seeing how well it contracts and relaxes and if there is muscle symmetry. After, the patient will get dressed and PT and patient will discuss exam findings and plan of care. PT and patient discuss plan of care, schedule, attendance policy and HEP activities.  Going through the vagina working on the pubovaginalis and puborectalis and sides of the introitus to improve circular contraction Neuromuscular re-education: Pelvic floor contraction training: Therapist finger in the vaginal canal working on pelvic floor contraction with a lift Exercises: Strengthening: Bridge with ball squeeze with pelvic floor contraction 10x  Supine press ball into knees with pelvic floor and abdominal contraction 10 x each leg Dead bug 10 x each side then placed a red band around the knees and held 2 # doing it 15 x each side Sitting pelvic floor contraction holding 10 sec 10 x and quick  flicks   PATIENT EDUCATION:  10/18/22 Education details: Access Code: [email or web address], educated patient on vaginal moisturizers and how to apply Person educated: Patient Education method: Programmer, multimedia, Demonstration, Tactile cues, Verbal cues, and Handouts Education comprehension: verbalized understanding, returned demonstration, verbal cues required, tactile cues required, and needs further education  HOME EXERCISE PROGRAM: 10/18/22 Access Code: [email or web address] URL: [email or web address] Date: 10/18/2022 Prepared by: Eulis Foster  Exercises - Seated Pelvic Floor Contraction  - 3 x daily - 7 x weekly - 1 sets - 10 reps - 10 sec hold - Seated Quick Flick Pelvic Floor Contractions  - 3 x daily - 7 x weekly - 1 sets - 15 reps  ASSESSMENT:  CLINICAL IMPRESSION: Patient is a 72 y.o. female who was seen today for physical therapy treatment for urinary and fecal incontinence. Patient is not leaking urine when bending forward. Vaginal strength is 4/5 and she is able to feel the contraction herself.  She will leak a dribble now.  Patient is able to feel the lower abdomin and pelvic floor contraction at the same time now. Mucous leakage with gas decreased by 60%. Patient will benefit from skilled therapy to improve pelvic floor strength and coordination to reduce leakage.   OBJECTIVE IMPAIRMENTS:  decreased coordination, decreased strength, and increased fascial restrictions.   ACTIVITY LIMITATIONS: bending, continence, toileting, and locomotion level  PARTICIPATION LIMITATIONS: community activity  PERSONAL FACTORS: Age and 3+ comorbidities: papillary thyroid carcinoma , IC, anterior hip replacement on left  are also affecting patient's functional outcome.   REHAB POTENTIAL: Excellent  CLINICAL DECISION MAKING: Stable/uncomplicated  EVALUATION COMPLEXITY: Low   GOALS: Goals reviewed with patient? Yes  [place] TERM GOALS: Target date: 10/29/22  Patient independent with initial pelvic floor  and core exercises.  Baseline: Goal status: Met 10/18/22  2.  Patient educated on vaginal moisturizers and how to apply correctly.  Baseline:  Goal status: Met 10/18/22   LONG TERM GOALS: Target date: 12/25/22  Patient independent with advanced HEP to improve pelvic floor and core strength to reduce urinary leakage.  Baseline:  Goal status: ongoing 11/10/22  2.  Patient reports the fecal/mucous leakage has decreased >/= 50% due to improved circular contraction and strength >/= 4/5.  Baseline: 60% better Goal status: ongoing 11/10/22  3.  Patient is able to bend forward and walk to the bathroom without urinary leakage due to pelvic floor strength >/= 3/5 holding 10 sec.  Baseline:  Goal status: ongoing 11/10/22   PLAN:  PT FREQUENCY: 1x/week  PT DURATION: 12 weeks  PLANNED INTERVENTIONS: Therapeutic exercises, Therapeutic activity, Neuromuscular re-education, Patient/Family education, Dry Needling, Electrical stimulation, Taping, Biofeedback, and Manual therapy  PLAN FOR NEXT SESSION:  update HEP, possible discharge if doing well  Eulis Foster, PT 11/10/22 10:51 AM

## 2022-11-14 ENCOUNTER — Ambulatory Visit: Payer: Medicare Other | Admitting: Internal Medicine

## 2022-11-14 ENCOUNTER — Encounter: Payer: Self-pay | Admitting: Internal Medicine

## 2022-11-14 VITALS — BP 118/80 | HR 80 | Temp 98.3°F | Ht 66.0 in | Wt 137.0 lb

## 2022-11-14 DIAGNOSIS — R5383 Other fatigue: Secondary | ICD-10-CM

## 2022-11-14 DIAGNOSIS — M791 Myalgia, unspecified site: Secondary | ICD-10-CM

## 2022-11-14 NOTE — Progress Notes (Signed)
   Subjective:   Patient ID: Patricia Conley, female    DOB: 1951-03-20, 72 y.o.   MRN: [id number]  HPI The patient is a 72 YO female coming in for concerns about recent labial swelling. Started beginning of July. Went to see ob/gyn and they assessed however by the time the following week she could see them it was mostly resolved. They did not treat. It has not recurred. She is concerned because of a prior tick bite to nether regions years ago that this could have been a tick or bug bite.   Review of Systems  Constitutional:  Positive for fatigue.  HENT: Negative.    Eyes: Negative.   Respiratory:  Negative for cough, chest tightness and shortness of breath.   Cardiovascular:  Negative for chest pain, palpitations and leg swelling.  Gastrointestinal:  Negative for abdominal distention, abdominal pain, constipation, diarrhea, nausea and vomiting.  Musculoskeletal:  Positive for myalgias.  Skin: Negative.   Neurological: Negative.   Psychiatric/Behavioral: Negative.      Objective:  Physical Exam Constitutional:      Appearance: She is well-developed.  HENT:     Head: Normocephalic and atraumatic.  Cardiovascular:     Rate and Rhythm: Normal rate and regular rhythm.  Pulmonary:     Effort: Pulmonary effort is normal. No respiratory distress.     Breath sounds: Normal breath sounds. No wheezing or rales.  Abdominal:     General: Bowel sounds are normal. There is no distension.     Palpations: Abdomen is soft.     Tenderness: There is no abdominal tenderness. There is no rebound.  Musculoskeletal:     Cervical back: Normal range of motion.  Skin:    General: Skin is warm and dry.  Neurological:     Mental Status: She is alert and oriented to person, place, and time.     Coordination: Coordination normal.     Vitals:   11/14/22 1545  BP: 118/80  Pulse: 80  Temp: 98.3 F (36.8 C)  TempSrc: Oral  SpO2: 96%  Weight: 137 lb (62.1 kg)  Height: 5\' 6"  (1.676 m)     Assessment & Plan:  Visit time 18 minutes in face to face communication with patient and coordination of care, additional 5 minutes spent in record review, coordination or care, ordering tests, communicating/referring to other healthcare professionals, documenting in medical records all on the same day of the visit for total time 23 minutes spent on the visit.

## 2022-11-15 LAB — LYME DISEASE SEROLOGY W/REFLEX: Lyme Total Antibody EIA: NEGATIVE

## 2022-11-17 NOTE — Assessment & Plan Note (Signed)
She is concerned about tick bourne illness. Will check lyme and RMSF for new fatigue and muscle aches.

## 2022-11-17 NOTE — Assessment & Plan Note (Signed)
She is concerned given episode of swelling and prior tick exposure of tick bourne illness. Checking lyme and RMSF.

## 2022-11-22 ENCOUNTER — Encounter: Payer: Medicare Other | Admitting: Physical Therapy

## 2022-11-23 ENCOUNTER — Ambulatory Visit: Payer: BLUE CROSS/BLUE SHIELD | Admitting: Neurology

## 2022-11-28 ENCOUNTER — Ambulatory Visit (INDEPENDENT_AMBULATORY_CARE_PROVIDER_SITE_OTHER): Payer: Medicare Other | Admitting: Neurology

## 2022-11-28 ENCOUNTER — Telehealth: Payer: Self-pay | Admitting: Neurology

## 2022-11-28 ENCOUNTER — Encounter: Payer: Self-pay | Admitting: Neurology

## 2022-11-28 VITALS — BP 116/75 | HR 79 | Ht 66.5 in | Wt 139.0 lb

## 2022-11-28 DIAGNOSIS — H5789 Other specified disorders of eye and adnexa: Secondary | ICD-10-CM | POA: Diagnosis not present

## 2022-11-28 DIAGNOSIS — H9202 Otalgia, left ear: Secondary | ICD-10-CM | POA: Diagnosis not present

## 2022-11-28 DIAGNOSIS — J329 Chronic sinusitis, unspecified: Secondary | ICD-10-CM

## 2022-11-28 DIAGNOSIS — H9192 Unspecified hearing loss, left ear: Secondary | ICD-10-CM

## 2022-11-28 DIAGNOSIS — R519 Headache, unspecified: Secondary | ICD-10-CM

## 2022-11-28 NOTE — Progress Notes (Signed)
[facility]    Provider:  Dr Lucia Gaskins Requesting Provider: Pincus Sanes, MD Primary Care Provider:  Pincus Sanes, MD  CC:  headaches  HPI:  Patricia Conley is a 72 y.o. female here as requested by Pincus Sanes, MD for new onset headache. has Gall bladder disease; Renal cyst; Adrenal nodule (HCC); Liver hemangioma; Hemorrhoids, external without complications; S/P left THA, AA; DJD (degenerative joint disease); History of gout; Bunion of great toe; Hair loss; Osteoporosis; Dry eye; Genital herpes; Palpitations; OSA (obstructive sleep apnea); Pap smear abnormality of cervix with ASCUS favoring benign; Mass of joint of finger; Loss of transverse plantar arch; Ganglion cyst of finger of right hand; Telogen effluvium; Burning sensation of skin, scalp; Xerostomia; Osteoarthritis of hip; Allergic rhinitis caused by mold; 1st MTP arthritis; Cervical lymphadenopathy; Fatigue; Urinary frequency; Myalgia; Arthralgia; Epidermoid cyst of finger of left hand; Prediabetes; Carotid aneurysm, left (HCC); Papillary thyroid carcinoma (HCC); Aneurysm of ascending aorta without rupture (HCC); EDS (Ehlers-Danlos syndrome); Postoperative hypothyroidism; Sinus pain; and Headache on their problem list.  I reviewed Dr. Earley Abide notes, patient was having some sinus issues and had stopped using her CPAP, she does have some migrainous headache history remote, also clenching and TMJ, but she had not been using her CPAP with all of the infection, she went to the [facility] emergency room and got a migraine cocktail.  Patient is here alone and says she has a new onset headaches that started on June 14. She got the cpap in may. She felt great on the cpap. Starting having headache June 14th, no sinus problems at that time, she was eercising and had immediate severe xplosion on exertion,The roots of her teeth hurt, her face hurts, sinuses hurt and had severe left tenderness at left maxillary, and her whole head feels  like it would explode she has never had anything like this the headache, worst headache of life, after 3 days she woke up with nocturnal headache and she has to get up 3x a go for owel movement, by 4am had excruciating headache hot rod in her head and would subside for a few hours and the headaches she wa started on augmentin and prednisone and helped and the head ache improved but never went away lasted overall about 3 weeks but she has not been having headaches since then.  She has heaviness in her forehead and throbbing the left temple, stopped using her cpap because of brown mucous(showed me  picture) and dark green out of her sinuses this morning. 6/29 had course of doxy. Still getting yellow mucous from sinus and seeing ENT 7/5 and she did a scope and scope showed left maxillary issues, she started rinsing and she has a neurosurgeon for her aneurysms and a vascular surgeon.Headaches are better but still some left temporal pain.No fevers, vision changes, neck or hip pain, some fatigue. 3 weeks ago she flew and having popping in ears and wakes up with swollen periorbital area. Was feeling great but resurfaced after flying. No other focal neurologic deficits, associated symptoms, inciting events or modifiable factors.  Reviewed notes, labs and imaging from outside physicians, which showed:  10/27/2022: Endoscopy: Findings: Crusting noted along left maxillary sinus mucosa with no  purulence. Widely patent maxillary antrostomy. Normal ethmoid defect, no  evidence of infection. Right maxillary antrostomy widely patent with  normal mucosa.   CTA H&N 10/16/2022: VASCULATURE FINDINGS:  Aortic Arch: No aneurysmal dilation or dissection  Subclavian arteries: No significant stenosis  Common Carotid Arteries: Unchanged  fusiform dilation of the bilateral  carotid bulbs measuring 9 mm on the right and 11 mm on the left, unchanged.  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     - Left: The left distal ICA is dilated and measures up to 8 mm,  unchanged.  Vertebral arteries: No significant stenosis   Intracranial ICAs: No significant stenosis  MCAs: No significant stenosis  ACAs:  No significant stenosis   Basilar artery: No significant stenosis  PCAs: No significant stenosis  Posterior Comm Arteries: No significant stenosis  SCAs: No significant stenosis  AICAs: No significant stenosis  PICAs: No significant stenosis   BRAIN FINDINGS:  Brain Parenchyma: There is no hemorrhage, cerebral edema, mass, mass  effect, or midline shift.  Ventricles and Sulci: Normal for age.   Extra-Axial Spaces: No extra-axial fluid collection.  Basal Cisterns: Normal.  Other: Normal paranasal sinuses, orbits, and cranium.   NECK FINDINGS:  Lymph Nodes: No enlarged or abnormal appearing lymph nodes.  Suprahyoid Neck: normal  Infrahyoid Neck: normal  Salivary Glands: normal  Thyroid: Normal.  Visualized portions of thorax: The visualized lung apices and upper thorax  are unremarkable.  Cervical Spine: No high-grade spinal canal stenosis. Multilevel cervical  spondylosis.   IMPRESSION:  1. No evidence of intracranial hemorrhage. No intracranial aneurysm  identified.  2. 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 Unchanged dilated left distal  ICA which measures up to 8 mm.  3.  No hemodynamically significant arterial stenosis in the neck.    10/16/2022: CT head: CLINICAL DATA:  Left-sided headache for 10 days.   EXAM: CT HEAD WITHOUT CONTRAST   TECHNIQUE: Contiguous axial images were obtained from the base of the skull through the vertex without intravenous contrast.   RADIATION DOSE REDUCTION: This exam was performed according to the departmental dose-optimization program which includes automated exposure control, adjustment of the mA  and/or kV according to patient size and/or use of iterative reconstruction technique.   COMPARISON:  CT head 04/14/2016   FINDINGS: Brain: There is no acute intracranial hemorrhage, extra-axial fluid collection, or acute infarct.   Parenchymal volume is normal. The ventricles are normal in size. Gray-white differentiation is preserved   The pituitary and suprasellar region are normal. There is no mass lesion. There is no mass effect or midline shift.   Vascular: No hyperdense vessel or unexpected calcification.   Skull: Normal. Negative for fracture or focal lesion.   Sinuses/Orbits: Postsurgical changes are partially imaged in the paranasal sinuses. The imaged sinuses are clear. The imaged globes and orbits are unremarkable.   Other: The mastoid air cells and middle ear cavities are clear.   IMPRESSION: No acute intracranial pathology.  CT Sinuses : 6/19/024:  Narrative & Impression  CLINICAL DATA:  Sinus pain left thigh history of surgery headache   EXAM: PARANASAL SINUSES - COMPLETE 3 + VIEW   COMPARISON:  None Available.   FINDINGS: Frontal sinuses appear clear. Midline nasal septum. No acute fluid levels. Paranasal sinuses appear grossly clear.   IMPRESSION: No acute air-fluid levels. Paranasal sinuses appear grossly clear by radiography   Recent Results (from the past 2160 hour(s))  CALPROTECTIN     Status: None   Collection Time: 09/04/22 10:11 AM   Specimen: Stool  Result Value Ref Range   Calprotectin 5 mcg/g  Comment:                                       Reference Range:                                       <50     Normal                                       50-120  Borderline                                       >120    Elevated . Calprotectin in Crohn's disease and ulcerative colitis can be five to several thousand times above the reference population (50 mcg/g or less). Levels are usually 50 mcg/g or less in healthy patients and with  irritable bowel syndrome. Repeat testing in 4-6 weeks is suggested for borderline values.   Comprehensive metabolic panel     Status: Abnormal   Collection Time: 10/20/22  3:11 PM  Result Value Ref Range   Sodium 138 135 - 145 mEq/L   Potassium 4.0 3.5 - 5.1 mEq/L   Chloride 101 96 - 112 mEq/L   CO2 29 19 - 32 mEq/L   Glucose, Bld 100 (H) 70 - 99 mg/dL   BUN 18 6 - 23 mg/dL   Creatinine, Ser 0.93 0.40 - 1.20 mg/dL   Total Bilirubin 0.8 0.2 - 1.2 mg/dL   Alkaline Phosphatase 57 39 - 117 U/L   AST 19 0 - 37 U/L   ALT 17 0 - 35 U/L   Total Protein 6.9 6.0 - 8.3 g/dL   Albumin 4.5 3.5 - 5.2 g/dL   GFR 23.55 >73.22 mL/min    Comment: Calculated using the CKD-EPI Creatinine Equation (2021)   Calcium 9.3 8.4 - 10.5 mg/dL  CBC with Differential/Platelet     Status: None   Collection Time: 10/20/22  3:11 PM  Result Value Ref Range   WBC 7.7 4.0 - 10.5 K/uL   RBC 4.63 3.87 - 5.11 Mil/uL   Hemoglobin 13.3 12.0 - 15.0 g/dL   HCT 02.5 42.7 - 06.2 %   MCV 88.7 78.0 - 100.0 fl   MCHC 32.4 30.0 - 36.0 g/dL   RDW 37.6 28.3 - 15.1 %   Platelets 309.0 150.0 - 400.0 K/uL   Neutrophils Relative % 68.6 43.0 - 77.0 %   Lymphocytes Relative 24.2 12.0 - 46.0 %   Monocytes Relative 5.5 3.0 - 12.0 %   Eosinophils Relative 1.2 0.0 - 5.0 %   Basophils Relative 0.5 0.0 - 3.0 %   Neutro Abs 5.3 1.4 - 7.7 K/uL   Lymphs Abs 1.9 0.7 - 4.0 K/uL   Monocytes Absolute 0.4 0.1 - 1.0 K/uL   Eosinophils Absolute 0.1 0.0 - 0.7 K/uL   Basophils Absolute 0.0 0.0 - 0.1 K/uL  Sedimentation rate     Status: None   Collection Time: 10/20/22  3:11 PM  Result Value Ref Range   Sed Rate 5 0 - 30 mm/hr  C-reactive protein     Status: None   Collection Time: 10/20/22  3:11 PM  Result Value Ref Range   CRP <1.0 0.5 - 20.0 mg/dL  Lyme Disease Serology w/Reflex     Status: None   Collection Time: 11/14/22  4:17 PM  Result Value Ref Range   Lyme Total Antibody EIA Negative Negative    Comment: Lyme antibodies not  detected. Reflex testing is not indicated. No laboratory evidence of infection with B. burgdorferi (Lyme disease). Negative results may occur in patients recently infected (less than or equal to 14 days) with B. burgdorferi.  If recent infection is suspected, repeat testing on a new sample collected in 7 to 14 days is recommended.   Rocky mtn spotted fvr abs pnl(IgG+IgM)     Status: None   Collection Time: 11/14/22  4:17 PM  Result Value Ref Range   RMSF IgG Not Detected Not Detected   RMSF IgM Not Detected Not Detected    Recent Results (from the past 2160 hour(s))  CALPROTECTIN     Status: None   Collection Time: 09/04/22 10:11 AM   Specimen: Stool  Result Value Ref Range   Calprotectin 5 mcg/g    Comment:                                       Reference Range:                                       <50     Normal                                       50-120  Borderline                                       >120    Elevated . Calprotectin in Crohn's disease and ulcerative colitis can be five to several thousand times above the reference population (50 mcg/g or less). Levels are usually 50 mcg/g or less in healthy patients and with irritable bowel syndrome. Repeat testing in 4-6 weeks is suggested for borderline values.   Comprehensive metabolic panel     Status: Abnormal   Collection Time: 10/20/22  3:11 PM  Result Value Ref Range   Sodium 138 135 - 145 mEq/L   Potassium 4.0 3.5 - 5.1 mEq/L   Chloride 101 96 - 112 mEq/L   CO2 29 19 - 32 mEq/L   Glucose, Bld 100 (H) 70 - 99 mg/dL   BUN 18 6 - 23 mg/dL   Creatinine, Ser 1.61 0.40 - 1.20 mg/dL   Total Bilirubin 0.8 0.2 - 1.2 mg/dL   Alkaline Phosphatase 57 39 - 117 U/L   AST 19 0 - 37 U/L   ALT 17 0 - 35 U/L   Total Protein 6.9 6.0 - 8.3 g/dL   Albumin 4.5 3.5 - 5.2 g/dL   GFR 09.60 >45.40 mL/min    Comment: Calculated using the CKD-EPI Creatinine Equation (2021)   Calcium 9.3 8.4 - 10.5 mg/dL  CBC with  Differential/Platelet     Status: None   Collection Time: 10/20/22  3:11 PM  Result Value Ref Range   WBC 7.7 4.0 -  10.5 K/uL   RBC 4.63 3.87 - 5.11 Mil/uL   Hemoglobin 13.3 12.0 - 15.0 g/dL   HCT 02.7 25.3 - 66.4 %   MCV 88.7 78.0 - 100.0 fl   MCHC 32.4 30.0 - 36.0 g/dL   RDW 40.3 47.4 - 25.9 %   Platelets 309.0 150.0 - 400.0 K/uL   Neutrophils Relative % 68.6 43.0 - 77.0 %   Lymphocytes Relative 24.2 12.0 - 46.0 %   Monocytes Relative 5.5 3.0 - 12.0 %   Eosinophils Relative 1.2 0.0 - 5.0 %   Basophils Relative 0.5 0.0 - 3.0 %   Neutro Abs 5.3 1.4 - 7.7 K/uL   Lymphs Abs 1.9 0.7 - 4.0 K/uL   Monocytes Absolute 0.4 0.1 - 1.0 K/uL   Eosinophils Absolute 0.1 0.0 - 0.7 K/uL   Basophils Absolute 0.0 0.0 - 0.1 K/uL  Sedimentation rate     Status: None   Collection Time: 10/20/22  3:11 PM  Result Value Ref Range   Sed Rate 5 0 - 30 mm/hr  C-reactive protein     Status: None   Collection Time: 10/20/22  3:11 PM  Result Value Ref Range   CRP <1.0 0.5 - 20.0 mg/dL  Lyme Disease Serology w/Reflex     Status: None   Collection Time: 11/14/22  4:17 PM  Result Value Ref Range   Lyme Total Antibody EIA Negative Negative    Comment: Lyme antibodies not detected. Reflex testing is not indicated. No laboratory evidence of infection with B. burgdorferi (Lyme disease). Negative results may occur in patients recently infected (less than or equal to 14 days) with B. burgdorferi.  If recent infection is suspected, repeat testing on a new sample collected in 7 to 14 days is recommended.   Rocky mtn spotted fvr abs pnl(IgG+IgM)     Status: None   Collection Time: 11/14/22  4:17 PM  Result Value Ref Range   RMSF IgG Not Detected Not Detected   RMSF IgM Not Detected Not Detected     Review of Systems: Patient complains of symptoms per HPI as well as the following symptoms headaches. Pertinent negatives and positives per HPI. All others negative.   Social History   Socioeconomic History    Marital status: Divorced    Spouse name: Not on file   Number of children: 1   Years of education: Not on file   Highest education level: Not on file  Occupational History   Occupation: Airline pilot   Occupation: retired  Tobacco Use   Smoking status: Never   Smokeless tobacco: Never  Vaping Use   Vaping status: Never Used  Substance and Sexual Activity   Alcohol use: Yes    Alcohol/week: 1.0 standard drink of alcohol    Types: 1 Standard drinks or equivalent per week    Comment: once weekly or 2-3 drinks per month   Drug use: No   Sexual activity: Not Currently  Other Topics Concern   Not on file  Social History Narrative   Caffeine: 1-2 cups/day   Social Determinants of Health   Financial Resource Strain: Low Risk  (09/07/2022)   Received from [facility], [facility]   Overall Financial Resource Strain (CARDIA)    Difficulty of Paying Living Expenses: Not hard at all  Food Insecurity: Low Risk  (10/27/2022)   Received from [facility]   Food vital sign    Within the past 12 months, you worried that your food would  run out before you got money to buy more: Never true    Within the past 12 months, the food you bought just didn't last and you didn't have money to get more. : Never true  Transportation Needs: No Transportation Needs (09/07/2022)   Received from [facility], [facility]   [facility] - Transportation    In the past 12 months, has lack of transportation kept you from medical appointments or from getting medications?: No    Lack of Transportation (Non-Medical): No  Physical Activity: Not on file  Stress: Not on file  Social Connections: Not on file  Intimate Partner Violence: Not on file    Family History  Problem Relation Age of Onset   Stroke Mother    Nephrolithiasis Mother    Diabetes Mother    Aneurysm Mother        x2   Cancer Mother        lymphoma, breast cancer   Depression  Father    Parkinsonism Father    Mental illness Sister        paranoia   Parkinson's disease Brother    Migraines Daughter    Other Daughter        POTS, Dysautonomia   Ehlers-Danlos syndrome Daughter    Drug abuse Nephew    Stomach cancer Neg Hx    Colon cancer Neg Hx    Esophageal cancer Neg Hx     Past Medical History:  Diagnosis Date   Acetabular labrum tear    right/ per pt history   Aneurysm of ascending aorta without rupture (HCC)    4 in neck and 1 in heart being watched by [facility]   Arthritis    Bradycardia    Colon polyps    DJD (degenerative joint disease)    EDS (Ehlers-Danlos syndrome)    Gall bladder disease    per pt report   Ganglion cyst    GERD (gastroesophageal reflux disease)    Hemorrhoids    History of colon polyps 07/2019   tubular adenoma   Low back pain 08/20/2016   Menopause    OSA (obstructive sleep apnea)    Osteoporosis    Pseudoaneurysm of carotid artery (HCC)    Sleep apnea    Telogen effluvium    Thyroid cancer (HCC) 2023   Thyroid disease     Patient Active Problem List   Diagnosis Date Noted   Sinus pain 10/11/2022   Headache 10/11/2022   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   EDS (Ehlers-Danlos syndrome) 01/26/2022   Carotid aneurysm, 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   Papillary thyroid carcinoma (HCC) 12/15/2021   Aneurysm of ascending aorta without rupture (HCC) 12/15/2021   Prediabetes 04/12/2021   Epidermoid cyst of finger of left hand 03/24/2021   Urinary frequency 10/05/2020   Myalgia 10/05/2020   Arthralgia 10/05/2020   Fatigue 10/04/2020   Cervical lymphadenopathy 04/16/2019   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   Burning sensation of skin, scalp 05/15/2017   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   Xerostomia 10/18/2016   Allergic rhinitis caused by mold 10/18/2016   Ganglion cyst of finger of right hand 08/08/2016   Loss of transverse plantar arch 06/12/2016   Mass of joint of finger 04/16/2016   Pap smear abnormality of cervix with  ASCUS favoring benign 02/08/2016   OSA (obstructive sleep apnea) 12/28/2015   Palpitations 08/05/2015   Genital herpes 07/27/2015   Osteoporosis 03/25/2015   Dry eye 03/25/2015   Hair loss 09/18/2014  Bunion of great toe 11/15/2012   DJD (degenerative joint disease) 11/11/2012   History of gout 11/11/2012   S/P left THA, AA 09/12/2011   Hemorrhoids, external without complications 08/18/2011   Osteoarthritis of hip 03/14/2011   Renal cyst 12/15/2010   Adrenal nodule (HCC) 12/15/2010   Liver hemangioma 12/15/2010   Gall bladder disease     Past Surgical History:  Procedure Laterality Date   BREAST SURGERY  04/24/2000   cyst on Left breast   CYSTECTOMY  04/24/1997   Right elbow   HEMORRHOID BANDING  07/24/2014   JOINT REPLACEMENT     KNEE ARTHROSCOPY Left 09/10/2013   Procedure: LEFT MEDIAL AND LATERAL KNEE ARTHROSCOPY WITH MENISCAL DEBRIDEMENT ;  Surgeon: Loanne Drilling, MD;  Location: [facility];  Service: Orthopedics;  Laterality: Left;   KNEE ARTHROSCOPY Left    NASAL SINUS SURGERY  1995, 2002   THYROIDECTOMY  02/2022   thyroid cancer   TOTAL HIP ARTHROPLASTY  09/12/2011   Procedure: TOTAL HIP ARTHROPLASTY ANTERIOR APPROACH;  Surgeon: [name], MD;  Location: [facility];  Service: Orthopedics;  Laterality: Left;    Current Outpatient Medications  Medication Sig Dispense Refill   amitriptyline (ELAVIL) 10 MG tablet Take 1 tablet (10 mg total) by mouth at bedtime. (Patient taking differently: Take 10 mg by mouth at bedtime as needed.) 90 tablet 3   ASPIRIN 81 PO Take by mouth daily.     Biotin 1 MG CAPS Take by mouth.     levothyroxine (SYNTHROID) 100 MCG tablet Take 100 mcg by mouth daily before breakfast.     MAGNESIUM GLYCINATE PO Take 3 each by mouth at bedtime.     mupirocin ointment (BACTROBAN) 2 % Apply 1 Application topically 2 (two) times daily. Left maxillary sinus     triamcinolone cream (KENALOG) 0.1 %      valACYclovir (VALTREX) 500 MG tablet Take 1 tablet  (500 mg total) by mouth daily. (Patient taking differently: Take 500 mg by mouth daily as needed.) 90 tablet 1   No current facility-administered medications for this visit.    Allergies as of 11/28/2022 - Review Complete 11/28/2022  Allergen Reaction Noted   Histamine Rash 04/21/2015    Vitals: BP 116/75 (BP Location: Left Arm, Patient Position: Sitting)   Pulse 79   Ht 5' 6.5" (1.689 m)   Wt 139 lb (63 kg)   BMI 22.10 kg/m  Last Weight:  Wt Readings from Last 1 Encounters:  11/28/22 139 lb (63 kg)   Last Height:   Ht Readings from Last 1 Encounters:  11/28/22 5' 6.5" (1.689 m)     Physical exam: Exam: Gen: NAD, conversant, well nourised, well groomed                     CV: RRR, no MRG. No Carotid Bruits. No peripheral edema, warm, nontender Eyes: Conjunctivae clear without exudates or hemorrhage Left TM looks like fluid, left maxillary tendernes  Neuro: Detailed Neurologic Exam  Speech:    Speech is normal; fluent and spontaneous with normal comprehension.  Cognition:    The patient is oriented to person, place, and time;     recent and remote memory intact;     language fluent;     normal attention, concentration,     fund of knowledge Cranial Nerves:    The pupils are equal, round, and reactive to light. The fundi are normal and spontaneous venous pulsations are present. Visual fields are full to  finger confrontation. Extraocular movements are intact. Trigeminal sensation is intact and the muscles of mastication are normal. The face is symmetric. The palate elevates in the midline. Hearing intact. Voice is normal. Shoulder shrug is normal. The tongue has normal motion without fasciculations.   Coordination:    Normal finger to nose and heel to shin. Normal rapid alternating movements.   Gait:    Heel-toe and tandem gait are normal.   Motor Observation:    No asymmetry, no atrophy, and no involuntary movements noted. Tone:    Normal muscle tone.     Posture:    Posture is normal. normal erect    Strength:    Strength is V/V in the upper and lower limbs.      Sensation: intact to LT     Reflex Exam:  DTR's:    Deep tendon reflexes in the upper and lower extremities are normal bilaterally.   Toes:    The toes are downgoing bilaterally.   Clonus:    Clonus is absent.    Assessment/Plan:  Patient's headaches appear sinus related. Very unlikely GCA but will check SED and CRP again and if elevated get an ultrasound of carotid artery due to low suspicion. Still given concerning symptoms recommend MRI brain. Also she shows me a picture of a big glob of brown and green and blood samples from her sinuses in the last few days, she has left maxillary tenderness today and her left ear looks like fluid behind it and she feels pressure in the left ear -  and symptoms recurred after she flew  just recently (was better before then). I spoke to my sleep specialist, cpap should have nothing to do with an infection unless dirty, she has very puffy eyes have her call her sleep physician possible too high pressure? Still needs thorough evaluation.   MRI brain w/wo contrast for concerning symptoms of Left temporal headache, Left ear pain, Hearing loss of left ear, unspecified hearing loss type, New onset of headaches after age 46, and Eye swelling, left.  Contacted pcp and ENT will wait for response Ultrasound of left temporal artery if elevated sed/crp - unlikely Blood work today Tried to help with mask fitting today and gave her a new mask to try  MRI of the brain w/w contrast specifiaclly with thin cuts IAC protocol  Orders Placed This Encounter  Procedures   MR BRAIN W WO CONTRAST   Sedimentation rate   C-reactive protein   Basic Metabolic Panel    Cc: Pincus Sanes, MD,  Pincus Sanes, MD  Naomie Dean, MD  [facility] [address] [place], [place] [address]  Phone [phone] Fax  [phone]

## 2022-11-28 NOTE — Patient Instructions (Signed)
MRI brain w/wo contrast Call pcp and ENT Ultrasound of left temporal artery Blood work

## 2022-11-28 NOTE — Telephone Encounter (Signed)
Pod 4: Patient can't reach ENT, has called multiple times, can we call and see if we can get her an appt? Patient's headaches appear sinus related; she shows me a picture of a big (disturbing) glob of brown and green and blood samples from her sinuses in the last few days, she has left maxillary tenderness today and her left ear looks like fluid behind it and she feels pressure in the left ear -  and symptoms recurred after she flew  just recently (was better before then).    Patricia Ann Skotnicki, DO - can we call office? I let Dr. Lawerance Bach know maybe she can treat her again (patient was headache free after being treated by Dr. Lawerance Bach with prednisone and antibiotics but the symtpoms and headaches recuured after she flew...)   Dr. Marene Lenz or Dr. Lawerance Bach - if you get this can you weigh in? I really don;t think this in the neurology realm. thanks

## 2022-11-29 ENCOUNTER — Telehealth: Payer: Self-pay | Admitting: Neurology

## 2022-11-29 ENCOUNTER — Telehealth: Payer: Self-pay | Admitting: Internal Medicine

## 2022-11-29 NOTE — Telephone Encounter (Signed)
medicare/[employer] sup NPR sent to GI [phone] 

## 2022-11-29 NOTE — Telephone Encounter (Signed)
Spoke with patient today.  She will call us back if she wants something sent in.   She was going to reach out to ENT and see about getting in or if they would send her in something.

## 2022-11-29 NOTE — Addendum Note (Signed)
Addended by: Pincus Sanes on: 11/29/2022 09:09 AM   Modules accepted: Orders

## 2022-11-29 NOTE — Telephone Encounter (Signed)
Thank you, you are the best.

## 2022-11-29 NOTE — Telephone Encounter (Signed)
Please call her -- Dr Lucia Gaskins reached out to [place]  and let us know she is having sinus infection symptoms.    Referral ordered for ENT   I can go ahead and start treatment again with abx and steroid if needed if she agrees - let  me know and will send in

## 2022-11-29 NOTE — Telephone Encounter (Addendum)
Retracted referral for ENT due to [facility] neurologist is not the referring provider. Contact [facility] ENT (Dr. Marene Lenz) to retract the referral. Informed them a referral was in [employer] to sent referral for ENT. Dr. Anders Simmonds referral coordinator said she would notify the provider and referral would have to be sent from their office.

## 2022-11-29 NOTE — Telephone Encounter (Signed)
Noted, thanks!

## 2022-11-29 NOTE — Telephone Encounter (Signed)
Referral ordered for Dr Marene Lenz  I will go ahead and reach out to her and start treatment now.

## 2022-11-30 ENCOUNTER — Telehealth (HOSPITAL_BASED_OUTPATIENT_CLINIC_OR_DEPARTMENT_OTHER): Payer: Self-pay | Admitting: Pulmonary Disease

## 2022-11-30 DIAGNOSIS — G4733 Obstructive sleep apnea (adult) (pediatric): Secondary | ICD-10-CM

## 2022-11-30 NOTE — Telephone Encounter (Signed)
Patient states having CPAP issues and- feels that the pressure incorrect and fluid filling under eye after wearing.  Schedule is booked until 9/19 and apps do not have anything until 9/11. Please advise if we need to double book.

## 2022-12-01 NOTE — Telephone Encounter (Signed)
Please get a copy of her CPAP download and have this scanned in for my review.  Can then adjust CPAP setting as needed.

## 2022-12-06 ENCOUNTER — Encounter: Payer: Self-pay | Admitting: Physical Therapy

## 2022-12-06 ENCOUNTER — Ambulatory Visit: Payer: Medicare Other | Attending: Gastroenterology | Admitting: Physical Therapy

## 2022-12-06 DIAGNOSIS — M6281 Muscle weakness (generalized): Secondary | ICD-10-CM | POA: Diagnosis present

## 2022-12-06 DIAGNOSIS — R278 Other lack of coordination: Secondary | ICD-10-CM | POA: Diagnosis present

## 2022-12-06 NOTE — Therapy (Signed)
OUTPATIENT PHYSICAL THERAPY FEMALE PELVIC TREATMENT   Patient Name: Patricia Conley MRN: [id number] DOB:[birth year], 72 y.o., female Today's Date: 12/06/2022  END OF SESSION:  PT End of Session - 12/06/22 1149     Visit Number 5    Date for PT Re-Evaluation 12/25/22    Authorization Type Medicare    Authorization - Visit Number 5    Authorization - Number of Visits 10    PT Start Time 1145    PT Stop Time 1225    PT Time Calculation (min) 40 min    Activity Tolerance Patient tolerated treatment well    Behavior During Therapy WFL for tasks assessed/performed             Past Medical History:  Diagnosis Date   Acetabular labrum tear    right/ per pt history   Aneurysm of ascending aorta without rupture (HCC)    4 in neck and 1 in heart being watched by [facility]   Arthritis    Bradycardia    Colon polyps    DJD (degenerative joint disease)    EDS (Ehlers-Danlos syndrome)    Gall bladder disease    per pt report   Ganglion cyst    GERD (gastroesophageal reflux disease)    Hemorrhoids    History of colon polyps 07/2019   tubular adenoma   Low back pain 08/20/2016   Menopause    OSA (obstructive sleep apnea)    Osteoporosis    Pseudoaneurysm of carotid artery (HCC)    Sleep apnea    Telogen effluvium    Thyroid cancer (HCC) 2023   Thyroid disease    Past Surgical History:  Procedure Laterality Date   BREAST SURGERY  04/24/2000   cyst on Left breast   CYSTECTOMY  04/24/1997   Right elbow   HEMORRHOID BANDING  07/24/2014   JOINT REPLACEMENT     KNEE ARTHROSCOPY Left 09/10/2013   Procedure: LEFT MEDIAL AND LATERAL KNEE ARTHROSCOPY WITH MENISCAL DEBRIDEMENT ;  Surgeon: Loanne Drilling, MD;  Location: [facility];  Service: Orthopedics;  Laterality: Left;   KNEE ARTHROSCOPY Left    NASAL SINUS SURGERY  1995, 2002   THYROIDECTOMY  02/2022   thyroid cancer   TOTAL HIP ARTHROPLASTY  09/12/2011   Procedure: TOTAL HIP ARTHROPLASTY ANTERIOR APPROACH;  Surgeon: [name], MD;  Location: [facility];  Service: Orthopedics;  Laterality: Left;   Patient Active Problem List   Diagnosis Date Noted   Sinus pain 10/11/2022   Headache 10/11/2022   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   EDS (Ehlers-Danlos syndrome) 01/26/2022   Carotid aneurysm, 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   Papillary thyroid carcinoma (HCC) 12/15/2021   Aneurysm of ascending aorta without rupture (HCC) 12/15/2021   Prediabetes 04/12/2021   Epidermoid cyst of finger of left hand 03/24/2021   Urinary frequency 10/05/2020   Myalgia 10/05/2020   Arthralgia 10/05/2020   Fatigue 10/04/2020   Cervical lymphadenopathy 04/16/2019   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   Burning sensation of skin, scalp 05/15/2017   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   Xerostomia 10/18/2016   Allergic rhinitis caused by mold 10/18/2016   Ganglion cyst of finger of right hand 08/08/2016   Loss of transverse plantar arch 06/12/2016   Mass of joint of finger 04/16/2016   Pap smear abnormality of cervix with ASCUS favoring benign 02/08/2016   OSA (obstructive sleep apnea) 12/28/2015   Palpitations 08/05/2015   Genital herpes 07/27/2015   Osteoporosis 03/25/2015   Dry eye 03/25/2015  Hair loss 09/18/2014   Bunion of great toe 11/15/2012   DJD (degenerative joint disease) 11/11/2012   History of gout 11/11/2012   S/P left THA, AA 09/12/2011   Hemorrhoids, external without complications 08/18/2011   Osteoarthritis of hip 03/14/2011   Renal cyst 12/15/2010   Adrenal nodule (HCC) 12/15/2010   Liver hemangioma 12/15/2010   Gall bladder disease     PCP: Pincus Sanes, MD  REFERRING PROVIDER: Benancio Deeds, MD   REFERRING DIAG:  R19.4 (ICD-10-CM) - Altered bowel habits  R15.9 (ICD-10-CM) - Incontinence of feces, unspecified fecal incontinence type  R32 (ICD-10-CM) - Urinary incontinence, unspecified type    THERAPY DIAG:  Muscle weakness (generalized)  Other lack of coordination  Rationale for Evaluation and  Treatment: Rehabilitation  ONSET DATE: 2020  SUBJECTIVE:                                                                                                                                                                                           SUBJECTIVE STATEMENT: I have had a trickle of urine the past few. I have had 2 times of fecal leakage in the past 3 weeks. I feel like I am going to have gas and a small amount of stool comes out that is firm.    PAIN:  Are you having pain? No   PRECAUTIONS: Other: cancer  WEIGHT BEARING RESTRICTIONS: No  FALLS:  Has patient fallen in last 6 months? No  LIVING ENVIRONMENT: Lives with: lives alone  OCCUPATION: retired, Airline pilot  PLOF: Independent  PATIENT GOALS: get the leakage under control  PERTINENT HISTORY:  papillary thyroid carcinoma , IC, anterior hip replacement on left   BOWEL MOVEMENT: Pain with bowel movement: No Type of bowel movement:Type (Bristol Stool Scale) type 1 and type 2 combo or Type 4, Frequency daily, Strain No, and Splinting no Fully empty rectum: No, stool size is not as big as it used to Leakage: Yes: mucous will leak with gas being released,  Fiber supplement: No  URINATION: Pain with urination: No Fully empty bladder: Yes:   Stream: Strong Urgency: Yes: uses the pelvic floor contraction to delay Frequency: Nighttime voids 0-1; daytime voids every few hours Leakage: walking to the bathroom Pads: Yes: pantyliner  INTERCOURSE: not active  PREGNANCY: Vaginal deliveries 1 Tearing Yes: episiotomy    OBJECTIVE:   DIAGNOSTIC FINDINGS:  none   COGNITION: Overall cognitive status: Within functional limits for tasks assessed     SENSATION: Light touch: Appears intact Proprioception: Appears intact   POSTURE: No Significant postural limitations  PELVIC ALIGNMENT: Pelvic alignment is correct  LUMBARAROM/PROM: Lumbar ROM is  full    LOWER EXTREMITY ROM: bilateral hip ROM is  full   LOWER EXTREMITY MMT:  MMT Right eval Left eval  Hip abduction 4/5 4/5  Hip adduction 4/5 4/5   PALPATION:   General  Difficulty with fully engaging her lower abdominal.                 External Perineal Exam dryness                              Internal Pelvic Floor tightness on the sides of the introitus and anterior of the anal canal.   Patient confirms identification and approves PT to assess internal pelvic floor and treatment Yes  PELVIC MMT:   MMT eval 10/09/22 10/18/22 11/10/22 12/06/22  Vaginal 3/5 anterior and posterior, 2/5 on the sides 3/5 with circular contraction   4/5 4/5 with good hug after manual work  Internal Anal Sphincter 3/5 except for anterior is 2/5  4/5    External Anal Sphincter 3/5 except for anterior is 2/5  4/5    Puborectalis 3/5  4/5    (Blank rows = not tested)        TONE: average  PROLAPSE: none  TODAY'S TREATMENT:    12/06/22 Manual: Internal pelvic floor techniques: No emotional/communication barriers or cognitive limitation. Patient is motivated to learn. Patient understands and agrees with treatment goals and plan. PT explains patient will be examined in standing, sitting, and lying down to see how their muscles and joints work. When they are ready, they will be asked to remove their underwear so PT can examine their perineum. The patient is also given the option of providing their own chaperone as one is not provided in our facility. The patient also has the right and is explained the right to defer or refuse any part of the evaluation or treatment including the internal exam. With the patient's consent, PT will use one gloved finger to gently assess the muscles of the pelvic floor, seeing how well it contracts and relaxes and if there is muscle symmetry. After, the patient will get dressed and PT and patient will discuss exam findings and plan of care. PT and patient discuss plan of care, schedule, attendance policy and HEP activities.   Going through the vaginal canal working on the levator ani, along the perineal body, along the introitus, pubovaginalis Exercises: Strengthening: Therapist finger in the vaginal canal giving tactile cues for a contraction and it kept improving as the therapist did the manual work.  Reviewed HEP verbally and for her to keep on doing her HEP Educated patient on vaginal wand to work on the pelvic floor muscle tightness    11/10/22 Manual: Internal pelvic floor techniques: No emotional/communication barriers or cognitive limitation. Patient is motivated to learn. Patient understands and agrees with treatment goals and plan. PT explains patient will be examined in standing, sitting, and lying down to see how their muscles and joints work. When they are ready, they will be asked to remove their underwear so PT can examine their perineum. The patient is also given the option of providing their own chaperone as one is not provided in our facility. The patient also has the right and is explained the right to defer or refuse any part of the evaluation or treatment including the internal exam. With the patient's consent, PT will use one gloved finger to gently assess the muscles of the pelvic floor, seeing  how well it contracts and relaxes and if there is muscle symmetry. After, the patient will get dressed and PT and patient will discuss exam findings and plan of care. PT and patient discuss plan of care, schedule, attendance policy and HEP activities.  Going through the vaginal canal working on the introitus, pubovaginalis, puborectalis to elongate for improved contraction Neuromuscular re-education: Pelvic floor contraction training: Therapist finger in the vaginal canal working on the pelvic floor contraction  Exercises: Strengthening: Pelvic floor contraction in sitting holding for 10 sec 10 x  Sitting contract the pelvic floor while pressing on the thighs to also engage the lower abdomen 15 x      10/18/22 Manual: Soft tissue mobilization: Manual work to the perineal body and outside the rectum to release the tissue Internal pelvic floor techniques: No emotional/communication barriers or cognitive limitation. Patient is motivated to learn. Patient understands and agrees with treatment goals and plan. PT explains patient will be examined in standing, sitting, and lying down to see how their muscles and joints work. When they are ready, they will be asked to remove their underwear so PT can examine their perineum. The patient is also given the option of providing their own chaperone as one is not provided in our facility. The patient also has the right and is explained the right to defer or refuse any part of the evaluation or treatment including the internal exam. With the patient's consent, PT will use one gloved finger to gently assess the muscles of the pelvic floor, seeing how well it contracts and relaxes and if there is muscle symmetry. After, the patient will get dressed and PT and patient will discuss exam findings and plan of care. PT and patient discuss plan of care, schedule, attendance policy and HEP activities.  Going through the rectum working on the puborectalis and obturator internist to elongate for contraction Neuromuscular re-education: Pelvic floor contraction training: Therapist finger in the rectum working on pelvic floor contraction rectally with lift Exercises: Strengthening: Sitting anal contraction holding 20 sec Sitting anal contraction with 5 quick flicks Attempted dead bug but increased her headache so stopped   PATIENT EDUCATION:  10/18/22 Education details: Access Code: [email or web address], educated patient on vaginal moisturizers and how to apply Person educated: Patient Education method: Programmer, multimedia, Demonstration, Tactile cues, Verbal cues, and Handouts Education comprehension: verbalized understanding, returned demonstration, verbal cues required, tactile cues  required, and needs further education  HOME EXERCISE PROGRAM: 10/18/22 Access Code: [email or web address] URL: [email or web address] Date: 10/18/2022 Prepared by: Eulis Foster  Exercises - Seated Pelvic Floor Contraction  - 3 x daily - 7 x weekly - 1 sets - 10 reps - 10 sec hold - Seated Quick Flick Pelvic Floor Contractions  - 3 x daily - 7 x weekly - 1 sets - 15 reps  ASSESSMENT:  CLINICAL IMPRESSION: Patient is a 72 y.o. female who was seen today for physical therapy treatment for urinary and fecal incontinence. Patient is not leaking urine when bending forward. Vaginal strength is 4/5 and she is able to feel the contraction herself.  She will leak a dribble now. Patient has had a constant urinary dribble at times in the past few days. She has not been able to do her exercises the past few weeks due to having other health issues to deal with. . Patient will benefit from skilled therapy to improve pelvic floor strength and coordination to reduce leakage.   OBJECTIVE IMPAIRMENTS: decreased coordination, decreased strength, and increased fascial restrictions.   ACTIVITY LIMITATIONS:  bending, continence, toileting, and locomotion level  PARTICIPATION LIMITATIONS: community activity  PERSONAL FACTORS: Age and 3+ comorbidities: papillary thyroid carcinoma , IC, anterior hip replacement on left  are also affecting patient's functional outcome.   REHAB POTENTIAL: Excellent  CLINICAL DECISION MAKING: Stable/uncomplicated  EVALUATION COMPLEXITY: Low   GOALS: Goals reviewed with patient? Yes  [place] TERM GOALS: Target date: 10/29/22  Patient independent with initial pelvic floor and core exercises.  Baseline: Goal status: Met 10/18/22  2.  Patient educated on vaginal moisturizers and how to apply correctly.  Baseline:  Goal status: Met 10/18/22   LONG TERM GOALS: Target date: 12/25/22  Patient independent with advanced HEP to improve pelvic floor and core strength to reduce urinary  leakage.  Baseline:  Goal status: ongoing 11/10/22  2.  Patient reports the fecal/mucous leakage has decreased >/= 50% due to improved circular contraction and strength >/= 4/5.  Baseline: 60% better Goal status: ongoing 11/10/22  3.  Patient is able to bend forward and walk to the bathroom without urinary leakage due to pelvic floor strength >/= 3/5 holding 10 sec.  Baseline:  Goal status: ongoing 11/10/22   PLAN:  PT FREQUENCY: 1x/week  PT DURATION: 12 weeks  PLANNED INTERVENTIONS: Therapeutic exercises, Therapeutic activity, Neuromuscular re-education, Patient/Family education, Dry Needling, Electrical stimulation, Taping, Biofeedback, and Manual therapy  PLAN FOR NEXT SESSION:  update HEP, see about the vaginal wand  Eulis Foster, PT 12/06/22 12:29 PM

## 2022-12-13 ENCOUNTER — Ambulatory Visit: Payer: Medicare Other | Admitting: Physical Therapy

## 2022-12-13 ENCOUNTER — Encounter: Payer: Self-pay | Admitting: Physical Therapy

## 2022-12-13 DIAGNOSIS — M6281 Muscle weakness (generalized): Secondary | ICD-10-CM

## 2022-12-13 DIAGNOSIS — R278 Other lack of coordination: Secondary | ICD-10-CM

## 2022-12-13 NOTE — Therapy (Signed)
OUTPATIENT PHYSICAL THERAPY FEMALE PELVIC TREATMENT   Patient Name: Patricia Conley MRN: [id number] DOB:04-06-1951, 72 y.o., female Today's Date: 12/13/2022  END OF SESSION:  PT End of Session - 12/13/22 1150     Visit Number 6    Date for PT Re-Evaluation 12/25/22    Authorization Type Medicare    Authorization - Visit Number 6    Authorization - Number of Visits 10    PT Start Time 1145    PT Stop Time 1225    PT Time Calculation (min) 40 min    Activity Tolerance Patient tolerated treatment well    Behavior During Therapy WFL for tasks assessed/performed             Past Medical History:  Diagnosis Date   Acetabular labrum tear    right/ per pt history   Aneurysm of ascending aorta without rupture (HCC)    4 in neck and 1 in heart being watched by [facility]   Arthritis    Bradycardia    Colon polyps    DJD (degenerative joint disease)    EDS (Ehlers-Danlos syndrome)    Gall bladder disease    per pt report   Ganglion cyst    GERD (gastroesophageal reflux disease)    Hemorrhoids    History of colon polyps 07/2019   tubular adenoma   Low back pain 08/20/2016   Menopause    OSA (obstructive sleep apnea)    Osteoporosis    Pseudoaneurysm of carotid artery (HCC)    Sleep apnea    Telogen effluvium    Thyroid cancer (HCC) 2023   Thyroid disease    Past Surgical History:  Procedure Laterality Date   BREAST SURGERY  04/24/2000   cyst on Left breast   CYSTECTOMY  04/24/1997   Right elbow   HEMORRHOID BANDING  07/24/2014   JOINT REPLACEMENT     KNEE ARTHROSCOPY Left 09/10/2013   Procedure: LEFT MEDIAL AND LATERAL KNEE ARTHROSCOPY WITH MENISCAL DEBRIDEMENT ;  Surgeon: Loanne Drilling, MD;  Location: [facility];  Service: Orthopedics;  Laterality: Left;   KNEE ARTHROSCOPY Left    NASAL SINUS SURGERY  1995, 2002   THYROIDECTOMY  02/2022   thyroid cancer   TOTAL HIP ARTHROPLASTY  09/12/2011   Procedure: TOTAL HIP ARTHROPLASTY ANTERIOR APPROACH;  Surgeon: [name], MD;  Location: [facility];  Service: Orthopedics;  Laterality: Left;   Patient Active Problem List   Diagnosis Date Noted   Sinus pain 10/11/2022   Headache 10/11/2022   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   EDS (Ehlers-Danlos syndrome) 01/26/2022   Carotid aneurysm, 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   Papillary thyroid carcinoma (HCC) 12/15/2021   Aneurysm of ascending aorta without rupture (HCC) 12/15/2021   Prediabetes 04/12/2021   Epidermoid cyst of finger of left hand 03/24/2021   Urinary frequency 10/05/2020   Myalgia 10/05/2020   Arthralgia 10/05/2020   Fatigue 10/04/2020   Cervical lymphadenopathy 04/16/2019   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   Burning sensation of skin, scalp 05/15/2017   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   Xerostomia 10/18/2016   Allergic rhinitis caused by mold 10/18/2016   Ganglion cyst of finger of right hand 08/08/2016   Loss of transverse plantar arch 06/12/2016   Mass of joint of finger 04/16/2016   Pap smear abnormality of cervix with ASCUS favoring benign 02/08/2016   OSA (obstructive sleep apnea) 12/28/2015   Palpitations 08/05/2015   Genital herpes 07/27/2015   Osteoporosis 03/25/2015   Dry eye 03/25/2015  Hair loss 09/18/2014   Bunion of great toe 11/15/2012   DJD (degenerative joint disease) 11/11/2012   History of gout 11/11/2012   S/P left THA, AA 09/12/2011   Hemorrhoids, external without complications 08/18/2011   Osteoarthritis of hip 03/14/2011   Renal cyst 12/15/2010   Adrenal nodule (HCC) 12/15/2010   Liver hemangioma 12/15/2010   Gall bladder disease     PCP: Pincus Sanes, MD  REFERRING PROVIDER: Benancio Deeds, MD   REFERRING DIAG:  R19.4 (ICD-10-CM) - Altered bowel habits  R15.9 (ICD-10-CM) - Incontinence of feces, unspecified fecal incontinence type  R32 (ICD-10-CM) - Urinary incontinence, unspecified type    THERAPY DIAG:  Muscle weakness (generalized)  Other lack of coordination  Rationale for Evaluation and  Treatment: Rehabilitation  ONSET DATE: 2020  SUBJECTIVE:                                                                                                                                                                                           SUBJECTIVE STATEMENT: I feel a trickle of urine instead of a burst of urine leakage now. I have been consistent with my exercises.     PAIN:  Are you having pain? No   PRECAUTIONS: Other: cancer  WEIGHT BEARING RESTRICTIONS: No  FALLS:  Has patient fallen in last 6 months? No  LIVING ENVIRONMENT: Lives with: lives alone  OCCUPATION: retired, Airline pilot  PLOF: Independent  PATIENT GOALS: get the leakage under control  PERTINENT HISTORY:  papillary thyroid carcinoma , IC, anterior hip replacement on left   BOWEL MOVEMENT: Pain with bowel movement: No Type of bowel movement:Type (Bristol Stool Scale) type 1 and type 2 combo or Type 4, Frequency daily, Strain No, and Splinting no Fully empty rectum: No, stool size is not as big as it used to Leakage: Yes: mucous will leak with gas being released,  Fiber supplement: No  URINATION: Pain with urination: No Fully empty bladder: Yes:   Stream: Strong Urgency: Yes: uses the pelvic floor contraction to delay Frequency: Nighttime voids 0-1; daytime voids every few hours Leakage: walking to the bathroom Pads: Yes: pantyliner  INTERCOURSE: not active  PREGNANCY: Vaginal deliveries 1 Tearing Yes: episiotomy    OBJECTIVE:   DIAGNOSTIC FINDINGS:  none   COGNITION: Overall cognitive status: Within functional limits for tasks assessed     SENSATION: Light touch: Appears intact Proprioception: Appears intact   POSTURE: No Significant postural limitations  PELVIC ALIGNMENT: Pelvic alignment is correct  LUMBARAROM/PROM: Lumbar ROM is full    LOWER EXTREMITY ROM: bilateral hip ROM is full   LOWER EXTREMITY MMT:  MMT Right eval  Left eval  Hip abduction 4/5 4/5   Hip adduction 4/5 4/5   PALPATION:   General  Difficulty with fully engaging her lower abdominal.                 External Perineal Exam dryness                              Internal Pelvic Floor tightness on the sides of the introitus and anterior of the anal canal.   Patient confirms identification and approves PT to assess internal pelvic floor and treatment Yes  PELVIC MMT:   MMT eval 10/09/22 10/18/22 11/10/22 12/06/22 12/13/22  Vaginal 3/5 anterior and posterior, 2/5 on the sides 3/5 with circular contraction   4/5 4/5 with good hug after manual work 4/5  Internal Anal Sphincter 3/5 except for anterior is 2/5  4/5     External Anal Sphincter 3/5 except for anterior is 2/5  4/5     Puborectalis 3/5  4/5     (Blank rows = not tested)        TONE: average  PROLAPSE: none  TODAY'S TREATMENT:    12/13/22 Manual: Myofascial release: Urogenital release going through the restrictions Internal pelvic floor techniques: No emotional/communication barriers or cognitive limitation. Patient is motivated to learn. Patient understands and agrees with treatment goals and plan. PT explains patient will be examined in standing, sitting, and lying down to see how their muscles and joints work. When they are ready, they will be asked to remove their underwear so PT can examine their perineum. The patient is also given the option of providing their own chaperone as one is not provided in our facility. The patient also has the right and is explained the right to defer or refuse any part of the evaluation or treatment including the internal exam. With the patient's consent, PT will use one gloved finger to gently assess the muscles of the pelvic floor, seeing how well it contracts and relaxes and if there is muscle symmetry. After, the patient will get dressed and PT and patient will discuss exam findings and plan of care. PT and patient discuss plan of care, schedule, attendance policy and HEP  activities.  Going through the vaginal canal working on the sides of the introitus, along the levator ani and perineal body Neuromuscular re-education: Pelvic floor contraction training: Therapist finger in the vaginal canal working on pelvic floor contraction  12/06/22 Manual: Internal pelvic floor techniques: No emotional/communication barriers or cognitive limitation. Patient is motivated to learn. Patient understands and agrees with treatment goals and plan. PT explains patient will be examined in standing, sitting, and lying down to see how their muscles and joints work. When they are ready, they will be asked to remove their underwear so PT can examine their perineum. The patient is also given the option of providing their own chaperone as one is not provided in our facility. The patient also has the right and is explained the right to defer or refuse any part of the evaluation or treatment including the internal exam. With the patient's consent, PT will use one gloved finger to gently assess the muscles of the pelvic floor, seeing how well it contracts and relaxes and if there is muscle symmetry. After, the patient will get dressed and PT and patient will discuss exam findings and plan of care. PT and patient discuss plan of care, schedule, attendance policy and HEP activities.  Going through the vaginal canal working on the levator ani, along the perineal body, along the introitus, pubovaginalis Exercises: Strengthening: Therapist finger in the vaginal canal giving tactile cues for a contraction and it kept improving as the therapist did the manual work.  Reviewed HEP verbally and for her to keep on doing her HEP Educated patient on vaginal wand to work on the pelvic floor muscle tightness    11/10/22 Manual: Internal pelvic floor techniques: No emotional/communication barriers or cognitive limitation. Patient is motivated to learn. Patient understands and agrees with treatment goals and  plan. PT explains patient will be examined in standing, sitting, and lying down to see how their muscles and joints work. When they are ready, they will be asked to remove their underwear so PT can examine their perineum. The patient is also given the option of providing their own chaperone as one is not provided in our facility. The patient also has the right and is explained the right to defer or refuse any part of the evaluation or treatment including the internal exam. With the patient's consent, PT will use one gloved finger to gently assess the muscles of the pelvic floor, seeing how well it contracts and relaxes and if there is muscle symmetry. After, the patient will get dressed and PT and patient will discuss exam findings and plan of care. PT and patient discuss plan of care, schedule, attendance policy and HEP activities.  Going through the vaginal canal working on the introitus, pubovaginalis, puborectalis to elongate for improved contraction Neuromuscular re-education: Pelvic floor contraction training: Therapist finger in the vaginal canal working on the pelvic floor contraction  Exercises: Strengthening: Pelvic floor contraction in sitting holding for 10 sec 10 x  Sitting contract the pelvic floor while pressing on the thighs to also engage the lower abdomen 15 x     PATIENT EDUCATION:  10/18/22 Education details: Access Code: [email or web address], educated patient on vaginal moisturizers and how to apply Person educated: Patient Education method: Programmer, multimedia, Demonstration, Tactile cues, Verbal cues, and Handouts Education comprehension: verbalized understanding, returned demonstration, verbal cues required, tactile cues required, and needs further education  HOME EXERCISE PROGRAM: 10/18/22 Access Code: [email or web address] URL: [email or web address] Date: 10/18/2022 Prepared by: Eulis Foster  Exercises - Seated Pelvic Floor Contraction  - 3 x daily - 7 x weekly - 1 sets - 10 reps - 10  sec hold - Seated Quick Flick Pelvic Floor Contractions  - 3 x daily - 7 x weekly - 1 sets - 15 reps  ASSESSMENT:  CLINICAL IMPRESSION: Patient is a 72 y.o. female who was seen today for physical therapy treatment for urinary and fecal incontinence. Pelvic floor strength is 4/5. She has not had fecal leakage since last visit. She will have a trickle of urine instead of a gush when she leaks as she is walking to the bathroom. Patient has not had fecal leakage since last visit.  Patient will benefit from skilled therapy to improve pelvic floor strength and coordination to reduce leakage.   OBJECTIVE IMPAIRMENTS: decreased coordination, decreased strength, and increased fascial restrictions.   ACTIVITY LIMITATIONS: bending, continence, toileting, and locomotion level  PARTICIPATION LIMITATIONS: community activity  PERSONAL FACTORS: Age and 3+ comorbidities: papillary thyroid carcinoma , IC, anterior hip replacement on left  are also affecting patient's functional outcome.   REHAB POTENTIAL: Excellent  CLINICAL DECISION MAKING: Stable/uncomplicated  EVALUATION COMPLEXITY: Low   GOALS: Goals reviewed with patient? Yes  [place] TERM GOALS: Target date: 10/29/22  Patient independent with initial  pelvic floor and core exercises.  Baseline: Goal status: Met 10/18/22  2.  Patient educated on vaginal moisturizers and how to apply correctly.  Baseline:  Goal status: Met 10/18/22   LONG TERM GOALS: Target date: 12/25/22  Patient independent with advanced HEP to improve pelvic floor and core strength to reduce urinary leakage.  Baseline:  Goal status: ongoing 11/10/22  2.  Patient reports the fecal/mucous leakage has decreased >/= 50% due to improved circular contraction and strength >/= 4/5.  Baseline: 60% better Goal status: ongoing 11/10/22  3.  Patient is able to bend forward and walk to the bathroom without urinary leakage due to pelvic floor strength >/= 3/5 holding 10 sec.  Baseline:   Goal status: ongoing 11/10/22   PLAN:  PT FREQUENCY: 1x/week  PT DURATION: 12 weeks  PLANNED INTERVENTIONS: Therapeutic exercises, Therapeutic activity, Neuromuscular re-education, Patient/Family education, Dry Needling, Electrical stimulation, Taping, Biofeedback, and Manual therapy  PLAN FOR NEXT SESSION:  update HEP, if doing well then discharge  Eulis Foster, PT 12/13/22 12:26 PM

## 2022-12-14 ENCOUNTER — Encounter (HOSPITAL_BASED_OUTPATIENT_CLINIC_OR_DEPARTMENT_OTHER): Payer: Self-pay

## 2022-12-14 NOTE — Telephone Encounter (Signed)
Please let her know I have reviewed the download and have sent an order to have her auto CPAP setting change to 5 - 8 cm water pressure.  She should call back if she continues to have trouble while using CPAP.

## 2022-12-19 NOTE — Progress Notes (Unsigned)
    Subjective:    Patient ID: Patricia Conley, female    DOB: 07-10-1950, 72 y.o.   MRN: [id number]      HPI [facility] is here for No chief complaint on file.   ? Hernia -      Medications and allergies reviewed with patient and updated if appropriate.  Current Outpatient Medications on File Prior to Visit  Medication Sig Dispense Refill   amitriptyline (ELAVIL) 10 MG tablet Take 1 tablet (10 mg total) by mouth at bedtime. (Patient taking differently: Take 10 mg by mouth at bedtime as needed.) 90 tablet 3   ASPIRIN 81 PO Take by mouth daily.     Biotin 1 MG CAPS Take by mouth.     levothyroxine (SYNTHROID) 100 MCG tablet Take 100 mcg by mouth daily before breakfast.     MAGNESIUM GLYCINATE PO Take 3 each by mouth at bedtime.     mupirocin ointment (BACTROBAN) 2 % Apply 1 Application topically 2 (two) times daily. Left maxillary sinus     triamcinolone cream (KENALOG) 0.1 %      valACYclovir (VALTREX) 500 MG tablet Take 1 tablet (500 mg total) by mouth daily. (Patient taking differently: Take 500 mg by mouth daily as needed.) 90 tablet 1   No current facility-administered medications on file prior to visit.    Review of Systems     Objective:  There were no vitals filed for this visit. BP Readings from Last 3 Encounters:  11/28/22 116/75  11/14/22 118/80  10/20/22 120/80   Wt Readings from Last 3 Encounters:  11/28/22 139 lb (63 kg)  11/14/22 137 lb (62.1 kg)  10/20/22 136 lb (61.7 kg)   There is no height or weight on file to calculate BMI.    Physical Exam         Assessment & Plan:    See Problem List for Assessment and Plan of chronic medical problems.

## 2022-12-20 ENCOUNTER — Encounter: Payer: Self-pay | Admitting: Physical Therapy

## 2022-12-20 ENCOUNTER — Ambulatory Visit: Payer: Medicare Other | Admitting: Internal Medicine

## 2022-12-20 ENCOUNTER — Ambulatory Visit: Payer: Medicare Other | Admitting: Physical Therapy

## 2022-12-20 VITALS — BP 108/72 | HR 70 | Temp 98.0°F | Ht 66.5 in | Wt 140.0 lb

## 2022-12-20 DIAGNOSIS — M6281 Muscle weakness (generalized): Secondary | ICD-10-CM

## 2022-12-20 DIAGNOSIS — R1031 Right lower quadrant pain: Secondary | ICD-10-CM | POA: Diagnosis not present

## 2022-12-20 DIAGNOSIS — R278 Other lack of coordination: Secondary | ICD-10-CM

## 2022-12-20 NOTE — Therapy (Signed)
OUTPATIENT PHYSICAL THERAPY FEMALE PELVIC TREATMENT   Patient Name: Patricia Conley MRN: [id number] DOB:09/22/1950, 72 y.o., female Today's Date: 12/20/2022  END OF SESSION:  PT End of Session - 12/20/22 1150     Visit Number 7    Date for PT Re-Evaluation 02/19/23    Authorization Type Medicare    Authorization - Visit Number 7    Authorization - Number of Visits 10    PT Start Time 1145    PT Stop Time 1225    PT Time Calculation (min) 40 min    Activity Tolerance Patient tolerated treatment well    Behavior During Therapy WFL for tasks assessed/performed             Past Medical History:  Diagnosis Date   Acetabular labrum tear    right/ per pt history   Aneurysm of ascending aorta without rupture (HCC)    4 in neck and 1 in heart being watched by [facility]   Arthritis    Bradycardia    Colon polyps    DJD (degenerative joint disease)    EDS (Ehlers-Danlos syndrome)    Gall bladder disease    per pt report   Ganglion cyst    GERD (gastroesophageal reflux disease)    Hemorrhoids    History of colon polyps 07/2019   tubular adenoma   Low back pain 08/20/2016   Menopause    OSA (obstructive sleep apnea)    Osteoporosis    Pseudoaneurysm of carotid artery (HCC)    Sleep apnea    Telogen effluvium    Thyroid cancer (HCC) 2023   Thyroid disease    Past Surgical History:  Procedure Laterality Date   BREAST SURGERY  04/24/2000   cyst on Left breast   CYSTECTOMY  04/24/1997   Right elbow   HEMORRHOID BANDING  07/24/2014   JOINT REPLACEMENT     KNEE ARTHROSCOPY Left 09/10/2013   Procedure: LEFT MEDIAL AND LATERAL KNEE ARTHROSCOPY WITH MENISCAL DEBRIDEMENT ;  Surgeon: Loanne Drilling, MD;  Location: [facility];  Service: Orthopedics;  Laterality: Left;   KNEE ARTHROSCOPY Left    NASAL SINUS SURGERY  1995, 2002   THYROIDECTOMY  02/2022   thyroid cancer   TOTAL HIP ARTHROPLASTY  09/12/2011   Procedure: TOTAL HIP ARTHROPLASTY ANTERIOR APPROACH;  Surgeon: [name], MD;  Location: [facility];  Service: Orthopedics;  Laterality: Left;   Patient Active Problem List   Diagnosis Date Noted   Right groin pain 12/20/2022   Sinus pain 10/11/2022   Headache 10/11/2022   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   EDS (Ehlers-Danlos syndrome) 01/26/2022   Carotid aneurysm, 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   Papillary thyroid carcinoma (HCC) 12/15/2021   Aneurysm of ascending aorta without rupture (HCC) 12/15/2021   Prediabetes 04/12/2021   Epidermoid cyst of finger of left hand 03/24/2021   Urinary frequency 10/05/2020   Myalgia 10/05/2020   Arthralgia 10/05/2020   Fatigue 10/04/2020   Cervical lymphadenopathy 04/16/2019   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   Burning sensation of skin, scalp 05/15/2017   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   Xerostomia 10/18/2016   Allergic rhinitis caused by mold 10/18/2016   Ganglion cyst of finger of right hand 08/08/2016   Loss of transverse plantar arch 06/12/2016   Mass of joint of finger 04/16/2016   Pap smear abnormality of cervix with ASCUS favoring benign 02/08/2016   OSA (obstructive sleep apnea) 12/28/2015   Palpitations 08/05/2015   Genital herpes 07/27/2015   Osteoporosis 03/25/2015  Dry eye 03/25/2015   Hair loss 09/18/2014   Bunion of great toe 11/15/2012   DJD (degenerative joint disease) 11/11/2012   History of gout 11/11/2012   S/P left THA, AA 09/12/2011   Hemorrhoids, external without complications 08/18/2011   Osteoarthritis of hip 03/14/2011   Renal cyst 12/15/2010   Adrenal nodule (HCC) 12/15/2010   Liver hemangioma 12/15/2010   Gall bladder disease     PCP: Pincus Sanes, MD  REFERRING PROVIDER: Benancio Deeds, MD   REFERRING DIAG:  R19.4 (ICD-10-CM) - Altered bowel habits  R15.9 (ICD-10-CM) - Incontinence of feces, unspecified fecal incontinence type  R32 (ICD-10-CM) - Urinary incontinence, unspecified type    THERAPY DIAG:  Muscle weakness (generalized)  Other lack of  coordination  Rationale for Evaluation and Treatment: Rehabilitation  ONSET DATE: 2020  SUBJECTIVE:                                                                                                                                                                                           SUBJECTIVE STATEMENT: This week I leaked urine and stool.   PAIN:  Are you having pain? No   PRECAUTIONS: Other: cancer  WEIGHT BEARING RESTRICTIONS: No  FALLS:  Has patient fallen in last 6 months? No  LIVING ENVIRONMENT: Lives with: lives alone  OCCUPATION: retired, Airline pilot  PLOF: Independent  PATIENT GOALS: get the leakage under control  PERTINENT HISTORY:  papillary thyroid carcinoma , IC, anterior hip replacement on left   BOWEL MOVEMENT: Pain with bowel movement: No Type of bowel movement:Type (Bristol Stool Scale) type 1 and type 2 combo or Type 4, Frequency daily, Strain No, and Splinting no Fully empty rectum: No, stool size is not as big as it used to Leakage: Yes: inch of stool when  gas being released,  Fiber supplement: No  URINATION: Pain with urination: No Fully empty bladder: Yes:   Stream: Strong Urgency: Yes: uses the pelvic floor contraction to delay Frequency: Nighttime voids 0-1; daytime voids every few hours Leakage: walking  Pads: Yes: pantyliner  INTERCOURSE: not active  PREGNANCY: Vaginal deliveries 1 Tearing Yes: episiotomy    OBJECTIVE:   DIAGNOSTIC FINDINGS:  none   COGNITION: Overall cognitive status: Within functional limits for tasks assessed     SENSATION: Light touch: Appears intact Proprioception: Appears intact   POSTURE: No Significant postural limitations  PELVIC ALIGNMENT: Pelvic alignment is correct  LUMBARAROM/PROM: Lumbar ROM is full    LOWER EXTREMITY ROM: bilateral hip ROM is full   LOWER EXTREMITY MMT:  MMT Right eval Left eval  Hip abduction 4/5 4/5  Hip adduction 4/5 4/5  PALPATION:   General   Difficulty with fully engaging her lower abdominal.                 External Perineal Exam dryness                              Internal Pelvic Floor tightness on the sides of the introitus and anterior of the anal canal.   Patient confirms identification and approves PT to assess internal pelvic floor and treatment Yes  PELVIC MMT:   MMT eval 10/09/22 10/18/22 11/10/22 12/06/22 12/13/22 12/20/22  Vaginal 3/5 anterior and posterior, 2/5 on the sides 3/5 with circular contraction   4/5 4/5 with good hug after manual work 4/5 4/5 with good hug of therapist finger for 10 sec  Internal Anal Sphincter 3/5 except for anterior is 2/5  4/5    4/5 40 sec  External Anal Sphincter 3/5 except for anterior is 2/5  4/5    4/5 40 sec  Puborectalis 3/5  4/5    4/5 40 sec  (Blank rows = not tested)        TONE: average  PROLAPSE: none  TODAY'S TREATMENT:  12/20/22 Manual: Internal pelvic floor techniques: No emotional/communication barriers or cognitive limitation. Patient is motivated to learn. Patient understands and agrees with treatment goals and plan. PT explains patient will be examined in standing, sitting, and lying down to see how their muscles and joints work. When they are ready, they will be asked to remove their underwear so PT can examine their perineum. The patient is also given the option of providing their own chaperone as one is not provided in our facility. The patient also has the right and is explained the right to defer or refuse any part of the evaluation or treatment including the internal exam. With the patient's consent, PT will use one gloved finger to gently assess the muscles of the pelvic floor, seeing how well it contracts and relaxes and if there is muscle symmetry. After, the patient will get dressed and PT and patient will discuss exam findings and plan of care. PT and patient discuss plan of care, schedule, attendance policy and HEP activities. Therapist finger in the vaginal  canal working on the sides of the introitus, ischiocavernosus, urogenital diaphram Therapist finger in the rectum working on the perineal body, along the puborectalis and anococcygeal ligament, and sphincter muscles  Neuromuscular re-education: Pelvic floor contraction training: Therapist finger in the vaginal canal working on circular contraction with a good hug and giving tactile cues to contract for 10 sec Therapist finger in the rectum working on circular hug of therapist finger and holding for 40 sec      12/13/22 Manual: Myofascial release: Urogenital release going through the restrictions Internal pelvic floor techniques: No emotional/communication barriers or cognitive limitation. Patient is motivated to learn. Patient understands and agrees with treatment goals and plan. PT explains patient will be examined in standing, sitting, and lying down to see how their muscles and joints work. When they are ready, they will be asked to remove their underwear so PT can examine their perineum. The patient is also given the option of providing their own chaperone as one is not provided in our facility. The patient also has the right and is explained the right to defer or refuse any part of the evaluation or treatment including the internal exam. With the patient's consent, PT will use one gloved finger  to gently assess the muscles of the pelvic floor, seeing how well it contracts and relaxes and if there is muscle symmetry. After, the patient will get dressed and PT and patient will discuss exam findings and plan of care. PT and patient discuss plan of care, schedule, attendance policy and HEP activities.  Going through the vaginal canal working on the sides of the introitus, along the levator ani and perineal body Neuromuscular re-education: Pelvic floor contraction training: Therapist finger in the vaginal canal working on pelvic floor contraction  12/06/22 Manual: Internal pelvic floor  techniques: No emotional/communication barriers or cognitive limitation. Patient is motivated to learn. Patient understands and agrees with treatment goals and plan. PT explains patient will be examined in standing, sitting, and lying down to see how their muscles and joints work. When they are ready, they will be asked to remove their underwear so PT can examine their perineum. The patient is also given the option of providing their own chaperone as one is not provided in our facility. The patient also has the right and is explained the right to defer or refuse any part of the evaluation or treatment including the internal exam. With the patient's consent, PT will use one gloved finger to gently assess the muscles of the pelvic floor, seeing how well it contracts and relaxes and if there is muscle symmetry. After, the patient will get dressed and PT and patient will discuss exam findings and plan of care. PT and patient discuss plan of care, schedule, attendance policy and HEP activities.  Going through the vaginal canal working on the levator ani, along the perineal body, along the introitus, pubovaginalis Exercises: Strengthening: Therapist finger in the vaginal canal giving tactile cues for a contraction and it kept improving as the therapist did the manual work.  Reviewed HEP verbally and for her to keep on doing her HEP Educated patient on vaginal wand to work on the pelvic floor muscle tightness      PATIENT EDUCATION:  10/18/22 Education details: Access Code: [email or web address], educated patient on vaginal moisturizers and how to apply Person educated: Patient Education method: Explanation, Demonstration, Tactile cues, Verbal cues, and Handouts Education comprehension: verbalized understanding, returned demonstration, verbal cues required, tactile cues required, and needs further education  HOME EXERCISE PROGRAM: 10/18/22 Access Code: [email or web address] URL: [email or web address] Date:  10/18/2022 Prepared by: Eulis Foster  Exercises - Seated Pelvic Floor Contraction  - 3 x daily - 7 x weekly - 1 sets - 10 reps - 10 sec hold - Seated Quick Flick Pelvic Floor Contractions  - 3 x daily - 7 x weekly - 1 sets - 15 reps  ASSESSMENT:  CLINICAL IMPRESSION: Patient is a 72 y.o. female who was seen today for physical therapy treatment for urinary and fecal incontinence. Stool leaked stool with gas yesterday and does not leak other times.  Patient does not leak urine with bending forward. She does leak urine when she walks. Vaginal strength is 4/5 after manual work because initially it was 3/5. Rectal strength is 4/5 but initially was weak anteriorly. Manual work assisted with increased in both contractions.  Patient will benefit from skilled therapy to improve pelvic floor strength and coordination to reduce leakage.   OBJECTIVE IMPAIRMENTS: decreased coordination, decreased strength, and increased fascial restrictions.   ACTIVITY LIMITATIONS: bending, continence, toileting, and locomotion level  PARTICIPATION LIMITATIONS: community activity  PERSONAL FACTORS: Age and 3+ comorbidities: papillary thyroid carcinoma , IC, anterior hip replacement on left  are also affecting patient's functional  outcome.   REHAB POTENTIAL: Excellent  CLINICAL DECISION MAKING: Stable/uncomplicated  EVALUATION COMPLEXITY: Low   GOALS: Goals reviewed with patient? Yes  [place] TERM GOALS: Target date: 10/29/22  Patient independent with initial pelvic floor and core exercises.  Baseline: Goal status: Met 10/18/22  2.  Patient educated on vaginal moisturizers and how to apply correctly.  Baseline:  Goal status: Met 10/18/22   LONG TERM GOALS: Target date: 02/19/23  Patient independent with advanced HEP to improve pelvic floor and core strength to reduce urinary leakage.  Baseline:  Goal status: ongoing 12/20/22  2.  Patient reports the fecal/mucous leakage has decreased >/= 50% due to improved  circular contraction and strength >/= 4/5.  Baseline: only leaks when passes gas Goal status: ongoing 12/20/22  3.  Patient is able to bend forward and walk to the bathroom without urinary leakage due to pelvic floor strength >/= 3/5 holding 10 sec.  Baseline: no leakage with bending forward, walking will leak urine Goal status: ongoing 12/20/22   PLAN:  PT FREQUENCY: 1x/week  PT DURATION: 12 weeks  PLANNED INTERVENTIONS: Therapeutic exercises, Therapeutic activity, Neuromuscular re-education, Patient/Family education, Dry Needling, Electrical stimulation, Taping, Biofeedback, and Manual therapy  PLAN FOR NEXT SESSION:  update HEP  Eulis Foster, PT 12/20/22 1:20 PM

## 2022-12-20 NOTE — Assessment & Plan Note (Signed)
Acute Has pain in her right groin and some pain in her right lower abdomen Started during yoga and a pose in which her body was somewhat contorted Pain in groin likely flare of her labral tear I think the pain in the lower abdomen is also related No obvious hernia on exam Pain had improved some with rest, but worsened after picking something up and hurts with certain activities She will monitor for a couple more weeks and if it does not get better/resolve she will schedule with her orthopedic

## 2022-12-20 NOTE — Patient Instructions (Addendum)
     [facility] [phone]   Kerby Moors - clinical social worker/therapist -  [phone]  [facility] [address] [phone]  [facility]  [address] [phone]  [facility] [address] [phone] Office  [facility]            [address] [phone]

## 2022-12-21 ENCOUNTER — Ambulatory Visit
Admission: RE | Admit: 2022-12-21 | Discharge: 2022-12-21 | Disposition: A | Discharge status: Home / Self Care | Payer: Medicare Other | Source: Ambulatory Visit | Attending: Neurology | Admitting: Neurology

## 2022-12-21 DIAGNOSIS — H9202 Otalgia, left ear: Secondary | ICD-10-CM | POA: Diagnosis not present

## 2022-12-21 DIAGNOSIS — H5789 Other specified disorders of eye and adnexa: Secondary | ICD-10-CM | POA: Diagnosis not present

## 2022-12-21 DIAGNOSIS — R519 Headache, unspecified: Secondary | ICD-10-CM

## 2022-12-21 DIAGNOSIS — H9192 Unspecified hearing loss, left ear: Secondary | ICD-10-CM

## 2022-12-21 MED ORDER — GADOPICLENOL 0.5 MMOL/ML IV SOLN
6.0000 mL | Freq: Once | INTRAVENOUS | Status: AC | PRN
  Administered 2022-12-21: 6 mL via INTRAVENOUS

## 2023-01-11 ENCOUNTER — Encounter (HOSPITAL_BASED_OUTPATIENT_CLINIC_OR_DEPARTMENT_OTHER): Payer: Self-pay | Admitting: Pulmonary Disease

## 2023-01-11 ENCOUNTER — Ambulatory Visit (INDEPENDENT_AMBULATORY_CARE_PROVIDER_SITE_OTHER): Payer: Medicare Other

## 2023-01-11 ENCOUNTER — Ambulatory Visit (INDEPENDENT_AMBULATORY_CARE_PROVIDER_SITE_OTHER): Payer: Medicare Other | Admitting: Pulmonary Disease

## 2023-01-11 VITALS — BP 122/68 | HR 72 | Ht 66.5 in | Wt 141.0 lb

## 2023-01-11 VITALS — BP 110/78 | HR 72 | Ht 66.0 in | Wt 141.8 lb

## 2023-01-11 DIAGNOSIS — G4733 Obstructive sleep apnea (adult) (pediatric): Secondary | ICD-10-CM | POA: Diagnosis not present

## 2023-01-11 DIAGNOSIS — Z0000 Encounter for general adult medical examination without abnormal findings: Secondary | ICD-10-CM | POA: Diagnosis not present

## 2023-01-11 NOTE — Patient Instructions (Signed)
Will change your auto CPAP setting to 6 - 10 cm water pressure  Follow up in 1 year

## 2023-01-11 NOTE — Progress Notes (Signed)
Subjective:   Patricia Conley is a 72 y.o. female who presents for an Initial Medicare Annual Wellness Visit.  Visit Complete: In person  Patient Medicare AWV questionnaire was completed by the patient on 01/10/2023; I have confirmed that all information answered by patient is correct and no changes since this date.  Cardiac Risk Factors include: advanced age (>6men, >66 women);Other (see comment), Risk factor comments: Liver hemangioma, Carotid aneurysm, OSA, Osteoporosis, EDS     Objective:    Today's Vitals   01/10/23 1950 01/11/23 1300  BP: 110/78   Pulse: 72   Weight: 141 lb 12.8 oz (64.3 kg) 141 lb 12.8 oz (64.3 kg)  Height: 5\' 6"  (1.676 m) 5\' 6"  (1.676 m)  PainSc: 5     Body mass index is 22.89 kg/m.     01/11/2023    1:12 PM 10/02/2022   10:19 AM 04/13/2016    9:38 PM 01/18/2016    5:29 PM 12/26/2015    8:37 PM 09/26/2014    2:36 PM 09/04/2013   10:39 AM  Advanced Directives  Does Patient Have a Medical Advance Directive? Yes Yes Yes;No Yes Yes No Patient has advance directive, copy not in chart  Type of Advance Directive Healthcare Power of [place];Living will Healthcare Power of [place];Living will Healthcare Power of [place];Living will Healthcare Power of [place];Living will Living will  Healthcare Power of [place];Living will  Does patient want to make changes to medical advance directive?  No - Patient declined   No - Patient declined    Copy of Healthcare Power of Attorney in Chart? No - copy requested No - copy requested   No - copy requested  Copy requested from family    Current Medications (verified) Outpatient Encounter Medications as of 01/11/2023  Medication Sig   amitriptyline (ELAVIL) 10 MG tablet Take 1 tablet (10 mg total) by mouth at bedtime. (Patient taking differently: Take 10 mg by mouth at bedtime as needed.)   ASPIRIN 81 PO Take by mouth daily.   Biotin 1 MG CAPS Take by mouth.   levothyroxine (SYNTHROID) 100 MCG tablet Take 100 mcg by  mouth daily before breakfast.   MAGNESIUM GLYCINATE PO Take 3 each by mouth at bedtime.   mupirocin ointment (BACTROBAN) 2 % Apply 1 Application topically 2 (two) times daily. Left maxillary sinus   triamcinolone cream (KENALOG) 0.1 %    valACYclovir (VALTREX) 500 MG tablet Take 1 tablet (500 mg total) by mouth daily. (Patient taking differently: Take 500 mg by mouth daily as needed.)   No facility-administered encounter medications on file as of 01/11/2023.    Allergies (verified) Histamine   History: Past Medical History:  Diagnosis Date   Acetabular labrum tear    right/ per pt history   Aneurysm of ascending aorta without rupture (HCC)    4 in neck and 1 in heart being watched by [facility]   Arthritis    Bradycardia    Colon polyps    DJD (degenerative joint disease)    EDS (Ehlers-Danlos syndrome)    Gall bladder disease    per pt report   Ganglion cyst    GERD (gastroesophageal reflux disease)    Hemorrhoids    History of colon polyps 07/2019   tubular adenoma   Low back pain 08/20/2016   Menopause    OSA (obstructive sleep apnea)    Osteoporosis    Pseudoaneurysm of carotid artery (HCC)    Sleep apnea    Telogen effluvium  Thyroid cancer (HCC) 2023   Thyroid disease    Past Surgical History:  Procedure Laterality Date   BREAST SURGERY  04/24/2000   cyst on Left breast   CYSTECTOMY  04/24/1997   Right elbow   HEMORRHOID BANDING  07/24/2014   JOINT REPLACEMENT     KNEE ARTHROSCOPY Left 09/10/2013   Procedure: LEFT MEDIAL AND LATERAL KNEE ARTHROSCOPY WITH MENISCAL DEBRIDEMENT ;  Surgeon: Loanne Drilling, MD;  Location: [facility];  Service: Orthopedics;  Laterality: Left;   KNEE ARTHROSCOPY Left    NASAL SINUS SURGERY  1995, 2002   THYROIDECTOMY  02/2022   thyroid cancer   TOTAL HIP ARTHROPLASTY  09/12/2011   Procedure: TOTAL HIP ARTHROPLASTY ANTERIOR APPROACH;  Surgeon: [name], MD;  Location: [facility];  Service: Orthopedics;  Laterality: Left;   Family  History  Problem Relation Age of Onset   Stroke Mother    Nephrolithiasis Mother    Diabetes Mother    Aneurysm Mother        x2   Cancer Mother        lymphoma, breast cancer   Depression Father    Parkinsonism Father    Mental illness Sister        paranoia   Parkinson's disease Brother    Migraines Daughter    Other Daughter        POTS, Dysautonomia   Ehlers-Danlos syndrome Daughter    Drug abuse Nephew    Stomach cancer Neg Hx    Colon cancer Neg Hx    Esophageal cancer Neg Hx    Social History   Socioeconomic History   Marital status: Divorced    Spouse name: Not on file   Number of children: 1   Years of education: Not on file   Highest education level: Bachelor's degree (e.g., BA, AB, BS)  Occupational History   Occupation: Airline pilot   Occupation: retired  Tobacco Use   Smoking status: Never   Smokeless tobacco: Never  Vaping Use   Vaping status: Never Used  Substance and Sexual Activity   Alcohol use: Yes    Alcohol/week: 1.0 standard drink of alcohol    Types: 1 Standard drinks or equivalent per week    Comment: once weekly or 2-3 drinks per month   Drug use: No   Sexual activity: Not Currently  Other Topics Concern   Not on file  Social History Narrative   Caffeine: 1-2 cups/day   Social Determinants of Health   Financial Resource Strain: Low Risk  (01/10/2023)   Overall Financial Resource Strain (CARDIA)    Difficulty of Paying Living Expenses: Not hard at all  Food Insecurity: No Food Insecurity (01/10/2023)   Hunger Vital Sign    Worried About Running Out of Food in the Last Year: Never true    Ran Out of Food in the Last Year: Never true  Transportation Needs: No Transportation Needs (01/10/2023)   PRAPARE - Administrator, Civil Service (Medical): No    Lack of Transportation (Non-Medical): No  Physical Activity: Sufficiently Active (01/10/2023)   Exercise Vital Sign    Days of Exercise per Week: 4 days    Minutes of Exercise  per Session: 60 min  Stress: No Stress Concern Present (01/10/2023)   [employer] of Occupational Health - Occupational Stress Questionnaire    Feeling of Stress : Only a little  Recent Concern: Stress - Stress Concern Present (12/19/2022)   [employer] of Occupational Health -  Occupational Stress Questionnaire    Feeling of Stress : To some extent  Social Connections: Unknown (01/10/2023)   Social Connection and Isolation Panel [NHANES]    Frequency of Communication with Friends and Family: More than three times a week    Frequency of Social Gatherings with Friends and Family: Twice a week    Attends Religious Services: Patient declined    Database administrator or Organizations: No    Attends Engineer, structural: Never    Marital Status: Divorced    Tobacco Counseling Counseling given: Not Answered   Clinical Intake:  Pre-visit preparation completed: Yes  Pain : 0-10 Pain Score: 5  Pain Type: Acute pain Pain Location: Hip Pain Orientation: Right Pain Descriptors / Indicators: Other (Comment) (Inflammation of joint socket) Pain Onset: In the past 7 days Pain Frequency: Constant Pain Relieving Factors: Ibprofren Effect of Pain on Daily Activities: When she is sitting  Pain Relieving Factors: Ibprofren  BMI - recorded: 22.89 Nutritional Status: BMI <19  Underweight Nutritional Risks: None Diabetes: No  How often do you need to have someone help you when you read instructions, pamphlets, or other written materials from your doctor or pharmacy?: 1 - Never  Interpreter Needed?: No  Information entered by :: Issaiah Seabrooks, RMA   Activities of Daily Living    01/10/2023    7:50 PM  In your present state of health, do you have any difficulty performing the following activities:  Hearing? 0  Vision? 0  Difficulty concentrating or making decisions? 0  Walking or climbing stairs? 0  Dressing or bathing? 0  Doing errands, shopping? 0  Preparing Food  and eating ? N  Using the Toilet? N  In the past six months, have you accidently leaked urine? Y  Do you have problems with loss of bowel control? Y  Managing your Medications? N  Managing your Finances? N  Housekeeping or managing your Housekeeping? N    Patient Care Team: Pincus Sanes, MD as PCP - General (Internal Medicine) Antony Contras, MD as Consulting Physician (Ophthalmology)  Indicate any recent Medical Services you may have received from other than [facility] providers in the past year (date may be approximate).     Assessment:   This is a routine wellness examination for [place].  Hearing/Vision screen Hearing Screening - Comments:: Denies hearing difficulties   Vision Screening - Comments:: Wears eyeglasses   Goals Addressed               This Visit's Progress     Patient Stated (pt-stated)        Would like to get back exercising at the [facility] everyday.  Would like to walk 3 miles everyday      Depression Screen    01/11/2023    1:15 PM 10/11/2022    2:47 PM 03/23/2022    9:44 AM 12/16/2021   11:17 AM 04/12/2021    8:35 AM 02/16/2020    8:19 AM 02/13/2019    8:09 AM  PHQ 2/9 Scores  PHQ - 2 Score 1 0 0 0 1 0 0  PHQ- 9 Score 1  1 1 2       Fall Risk    01/10/2023    7:50 PM 11/14/2022    3:50 PM 10/11/2022    2:47 PM 03/23/2022    9:44 AM 12/16/2021   11:17 AM  Fall Risk   Falls in the past year? 0 0 0 0 0  Number falls in past  yr: 0 0 0 0 0  Injury with Fall? 0 0 0 0 0  Risk for fall due to :   No Fall Risks No Fall Risks No Fall Risks  Follow up Falls evaluation completed;Falls prevention discussed  Falls evaluation completed Falls evaluation completed Falls evaluation completed    MEDICARE RISK AT HOME: Medicare Risk at Home Any stairs in or around the home?: Yes If so, are there any without handrails?: No Home free of loose throw rugs in walkways, pet beds, electrical cords, etc?: Yes Adequate lighting in your home to reduce risk of falls?:  Yes Life alert?: No Use of a cane, walker or w/c?: No Grab bars in the bathroom?: No Shower chair or bench in shower?: No Elevated toilet seat or a handicapped toilet?: No  TIMED UP AND GO:  Was the test performed? Yes  Length of time to ambulate 10 feet: 10 sec Gait steady and fast without use of assistive device    Cognitive Function:        01/11/2023    1:12 PM  6CIT Screen  What Year? 0 points  What month? 0 points  What time? 0 points  Count back from 20 0 points  Months in reverse 0 points  Repeat phrase 0 points  Total Score 0 points    Immunizations Immunization History  Administered Date(s) Administered   Fluad Quad(high Dose 65+) 02/13/2019, 02/10/2020, 03/04/2021, 03/28/2022   Influenza Inj Mdck Quad Pf 04/07/2011   Influenza, Seasonal, Injecte, Preservative Fre 04/07/2011, 03/10/2016, 02/05/2017   Influenza,inj,Quad PF,6+ Mos 03/10/2016, 02/11/2018   Influenza,inj,quad, With Preservative 01/22/2018   Pneumococcal Conjugate-13 01/17/2019   Pneumococcal Polysaccharide-23 02/16/2020   Tdap 09/18/2017    TDAP status: Up to date  Flu Vaccine status: Due, Education has been provided regarding the importance of this vaccine. Advised may receive this vaccine at local pharmacy or Health Dept. Aware to provide a copy of the vaccination record if obtained from local pharmacy or Health Dept. Verbalized acceptance and understanding.  Pneumococcal vaccine status: Up to date  Covid-19 vaccine status: Information provided on how to obtain vaccines.   Qualifies for Shingles Vaccine? Yes   Zostavax completed No   Shingrix Completed?: No.    Education has been provided regarding the importance of this vaccine. Patient has been advised to call insurance company to determine out of pocket expense if they have not yet received this vaccine. Advised may also receive vaccine at local pharmacy or Health Dept. Verbalized acceptance and understanding.  Screening Tests Health  Maintenance  Topic Date Due   COVID-19 Vaccine (1) Never done   Zoster Vaccines- Shingrix (1 of 2) Never done   INFLUENZA VACCINE  11/23/2022   MAMMOGRAM  05/18/2023   DEXA SCAN  11/16/2023   Medicare Annual Wellness (AWV)  01/11/2024   Colonoscopy  06/15/2024   DTaP/Tdap/Td (2 - Td or Tdap) 09/19/2027   Pneumonia Vaccine 35+ Years old  Completed   Hepatitis C Screening  Completed   HPV VACCINES  Aged Out    Health Maintenance  Health Maintenance Due  Topic Date Due   COVID-19 Vaccine (1) Never done   Zoster Vaccines- Shingrix (1 of 2) Never done   INFLUENZA VACCINE  11/23/2022    Colorectal cancer screening: Type of screening: Colonoscopy. Completed 2021. Repeat every 5 years  Mammogram status: Completed 05/17/2022. Repeat every year  Bone Density status: Completed 11/16/2023. Results reflect: Bone density results: OSTEOPOROSIS. Repeat every 2 years.  Lung Cancer Screening: (Low  Dose CT Chest recommended if Age 29-80 years, 20 pack-year currently smoking OR have quit w/in 15years.) does not qualify.   Lung Cancer Screening Referral: N/A  Additional Screening:  Hepatitis C Screening: does qualify; Completed 09/20/2016  Vision Screening: Recommended annual ophthalmology exams for early detection of glaucoma and other disorders of the eye. Is the patient up to date with their annual eye exam?  Yes  Who is the provider or what is the name of the office in which the patient attends annual eye exams? Dr. Randon Goldsmith If pt is not established with a provider, would they like to be referred to a provider to establish care? No .   Dental Screening: Recommended annual dental exams for proper oral hygiene  Community Resource Referral / Chronic Care Management: CRR required this visit?  No   CCM required this visit?  No     Plan:     I have personally reviewed and noted the following in the patient's chart:   Medical and social history Use of alcohol, tobacco or illicit drugs   Current medications and supplements including opioid prescriptions. Patient is not currently taking opioid prescriptions. Functional ability and status Nutritional status Physical activity Advanced directives List of other physicians Hospitalizations, surgeries, and ER visits in previous 12 months Vitals Screenings to include cognitive, depression, and falls Referrals and appointments  In addition, I have reviewed and discussed with patient certain preventive protocols, quality metrics, and best practice recommendations. A written personalized care plan for preventive services as well as general preventive health recommendations were provided to patient.     Saryna Kneeland L Jaeley Wiker, CMA   01/11/2023   After Visit Summary: (MyChart) Due to this being a telephonic visit, the after visit summary with patients personalized plan was offered to patient via MyChart   Nurse Notes: Patient is due for Flu, Covid and Shingrix vaccines.  She will getting them soon.  Patient is up to date on all other health maintenance.  She has no other concerns to address today.

## 2023-01-11 NOTE — Progress Notes (Signed)
Pulmonary, Critical Care, and Sleep Medicine  Chief Complaint  Patient presents with   Sleep Apnea    Past Surgical History:  She  has a past surgical history that includes Nasal sinus surgery (1995, 2002); Cystectomy (04/24/1997); Breast surgery (04/24/2000); Total hip arthroplasty (09/12/2011); Joint replacement; Knee arthroscopy (Left, 09/10/2013); Hemorrhoid banding (07/24/2014); Thyroidectomy (02/2022); and Knee arthroscopy (Left).  Past Medical History:  OA, GERD, Back pain, Osteoporosis, Thyroid cancer with post treatment hypothyroidism  Constitutional:  BP 122/68   Pulse 72   Ht 5' 6.5" (1.689 m)   Wt 141 lb (64 kg)   SpO2 99%   BMI 22.42 kg/m   Brief Summary:  Patricia Conley is a 72 y.o. female with obstructive sleep apnea. She has hx of TMJ.       Subjective:   She had more trouble with sinus congestion and pressure.  Headache improved after therapy.  Still has trouble with drainage.  Has follow up with ENT.    Physical Exam:   Appearance - well kempt   ENMT - no sinus tenderness, no oral exudate, no LAN, Mallampati 3 airway, no stridor, over-bite, grinding with jaw opening at TMJ  Respiratory - equal breath sounds bilaterally, no wheezing or rales  CV - s1s2 regular rate and rhythm, no murmurs  Ext - no clubbing, no edema  Skin - no rashes  Psych - normal mood and affect   Sleep Tests:  PSG 12/26/15 >> AHI 0.7, SaO2 low 89%  HST 09/19/17 >> AHI 13.4, SpO2 low 86% HST 07/03/22 >> AHI 7.6, SpO2 low 88%  Auto CPAP 08/27/22 to 09/25/22 >> used on 20 of 30 nights with average 7 hrs 43 min.  Average AHI 2.5 with median CPAP 7 and 95 th percentile CPAP 9 cm H2O  Cardiac Tests:  Echo 10/22/14 >> grade 2 DD, mod TR  Social History:  She  reports that she has never smoked. She has never used smokeless tobacco. She reports current alcohol use of about 1.0 standard drink of alcohol per week. She reports that she does not use drugs.  Family  History:  Her family history includes Aneurysm in her mother; Cancer in her mother; Depression in her father; Diabetes in her mother; Drug abuse in her nephew; Ehlers-Danlos syndrome in her daughter; Mental illness in her sister; Migraines in her daughter; Nephrolithiasis in her mother; Other in her daughter; Parkinson's disease in her brother; Parkinsonism in her father; Stroke in her mother.     Assessment/Plan:   Obstructive sleep apnea. - she is compliant with CPAP and reports benefit from therapy - she uses [employer] for her DME - current CPAP ordered April 2024 - she will resume using CPAP after her sinus issues have improved - discussed options for mask fit and cleaning equipment - will change to auto CPAP 6 - 10 cm H2O  Allergic rhinitis with facial swelling. - followed by Dr. Scot Jun with ENT  Metastatic papillary thyroid cancer with post-treatment hypothyroidism. - followed by [facility]ocrin[facility] and [facility] at [facility]  Multiple aneurysms of ICA, Rt P1, ascending aorta. - followed by neurosurgery at [facility]   Time Spent Involved in Patient Care on Day of Examination:  26 minutes  Follow up:   Patient Instructions  Will change your auto CPAP setting to 6 - 10 cm water pressure  Follow up in 1 year  Medication List:   Allergies as of 01/11/2023       Reactions   Histamine Rash  Flushing        Medication List        Accurate as of January 11, 2023 12:08 PM. If you have any questions, ask your nurse or doctor.          amitriptyline 10 MG tablet Commonly known as: ELAVIL Take 1 tablet (10 mg total) by mouth at bedtime. What changed:  when to take this reasons to take this   ASPIRIN 81 PO Take by mouth daily.   Biotin 1 MG Caps Take by mouth.   levothyroxine 100 MCG tablet Commonly known as: SYNTHROID Take 100 mcg by mouth daily before breakfast.   MAGNESIUM GLYCINATE PO Take 3 each by mouth at bedtime.   mupirocin ointment 2 % Commonly  known as: BACTROBAN Apply 1 Application topically 2 (two) times daily. Left maxillary sinus   triamcinolone cream 0.1 % Commonly known as: KENALOG   valACYclovir 500 MG tablet Commonly known as: Valtrex Take 1 tablet (500 mg total) by mouth daily. What changed:  when to take this reasons to take this        Signature:  Coralyn Helling, MD [facility] Pulmonary/Critical Care Pager - [phone] 01/11/2023, 12:08 PM

## 2023-01-11 NOTE — Patient Instructions (Signed)
Ms. Patricia Conley , Thank you for taking time to come for your Medicare Wellness Visit. I appreciate your ongoing commitment to your health goals. Please review the following plan we discussed and let me know if I can assist you in the future.   Referrals/Orders/Follow-Ups/Clinician Recommendations: You are due for a Flu, Covid and a Shingles vaccine.  It was nice to meet you today.  Keep up the good work.  This is a list of the screening recommended for you and due dates:  Health Maintenance  Topic Date Due   COVID-19 Vaccine (1) Never done   Zoster (Shingles) Vaccine (1 of 2) Never done   Flu Shot  11/23/2022   Mammogram  05/18/2023   DEXA scan (bone density measurement)  11/16/2023   Medicare Annual Wellness Visit  01/11/2024   Colon Cancer Screening  06/15/2024   DTaP/Tdap/Td vaccine (2 - Td or Tdap) 09/19/2027   Pneumonia Vaccine  Completed   Hepatitis C Screening  Completed   HPV Vaccine  Aged Out    Advanced directives: (Provided) Advance directive discussed with you today. I have provided a copy for you to complete at home and have notarized. Once this is complete, please bring a copy in to our office so we can scan it into your chart.   Next Medicare Annual Wellness Visit scheduled for next year: No

## 2023-01-23 ENCOUNTER — Ambulatory Visit (INDEPENDENT_AMBULATORY_CARE_PROVIDER_SITE_OTHER): Payer: Medicare Other

## 2023-01-23 DIAGNOSIS — Z23 Encounter for immunization: Secondary | ICD-10-CM

## 2023-01-23 NOTE — Progress Notes (Signed)
Patient presented in office today for HD Flu Vaccine. HD Flu Vaccine was administered into Left Deltoid Muscle. Patient tolerated injection well and injection site looked fine. Patient was advised to report to the office immediately if she notices any adverse reactions.

## 2023-01-31 ENCOUNTER — Encounter: Payer: Self-pay | Admitting: Physical Therapy

## 2023-01-31 ENCOUNTER — Ambulatory Visit: Payer: Medicare Other | Attending: Gastroenterology | Admitting: Physical Therapy

## 2023-01-31 DIAGNOSIS — R278 Other lack of coordination: Secondary | ICD-10-CM | POA: Diagnosis present

## 2023-01-31 DIAGNOSIS — M6281 Muscle weakness (generalized): Secondary | ICD-10-CM | POA: Diagnosis present

## 2023-01-31 NOTE — Therapy (Signed)
OUTPATIENT PHYSICAL THERAPY FEMALE PELVIC TREATMENT   Patient Name: Patricia Conley MRN: [id number] DOB:Jun 30, 1950, 72 y.o., female Today's Date: 01/31/2023  END OF SESSION:  PT End of Session - 01/31/23 1101     Visit Number 8    Date for PT Re-Evaluation 04/16/23    Authorization Type Medicare    Authorization - Visit Number 8    Authorization - Number of Visits 10    PT Start Time 1100    PT Stop Time 1140    PT Time Calculation (min) 40 min    Activity Tolerance Patient tolerated treatment well    Behavior During Therapy WFL for tasks assessed/performed             Past Medical History:  Diagnosis Date   Acetabular labrum tear    right/ per pt history   Aneurysm of ascending aorta without rupture (HCC)    4 in neck and 1 in heart being watched by [facility]   Arthritis    Bradycardia    Colon polyps    DJD (degenerative joint disease)    EDS (Ehlers-Danlos syndrome)    Gall bladder disease    per pt report   Ganglion cyst    GERD (gastroesophageal reflux disease)    Hemorrhoids    History of colon polyps 07/2019   tubular adenoma   Low back pain 08/20/2016   Menopause    OSA (obstructive sleep apnea)    Osteoporosis    Pseudoaneurysm of carotid artery (HCC)    Sleep apnea    Telogen effluvium    Thyroid cancer (HCC) 2023   Thyroid disease    Past Surgical History:  Procedure Laterality Date   BREAST SURGERY  04/24/2000   cyst on Left breast   CYSTECTOMY  04/24/1997   Right elbow   HEMORRHOID BANDING  07/24/2014   JOINT REPLACEMENT     KNEE ARTHROSCOPY Left 09/10/2013   Procedure: LEFT MEDIAL AND LATERAL KNEE ARTHROSCOPY WITH MENISCAL DEBRIDEMENT ;  Surgeon: Loanne Drilling, MD;  Location: [facility];  Service: Orthopedics;  Laterality: Left;   KNEE ARTHROSCOPY Left    NASAL SINUS SURGERY  1995, 2002   THYROIDECTOMY  02/2022   thyroid cancer   TOTAL HIP ARTHROPLASTY  09/12/2011   Procedure: TOTAL HIP ARTHROPLASTY ANTERIOR APPROACH;  Surgeon: [name], MD;  Location: [facility];  Service: Orthopedics;  Laterality: Left;   Patient Active Problem List   Diagnosis Date Noted   Right groin pain 12/20/2022   Sinus pain 10/11/2022   Headache 10/11/2022   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   EDS (Ehlers-Danlos syndrome) 01/26/2022   Carotid aneurysm, 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   Papillary thyroid carcinoma (HCC) 12/15/2021   Aneurysm of ascending aorta without rupture (HCC) 12/15/2021   Prediabetes 04/12/2021   Epidermoid cyst of finger of left hand 03/24/2021   Urinary frequency 10/05/2020   Myalgia 10/05/2020   Arthralgia 10/05/2020   Fatigue 10/04/2020   Cervical lymphadenopathy 04/16/2019   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   Burning sensation of skin, scalp 05/15/2017   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   Xerostomia 10/18/2016   Allergic rhinitis caused by mold 10/18/2016   Ganglion cyst of finger of right hand 08/08/2016   Loss of transverse plantar arch 06/12/2016   Mass of joint of finger 04/16/2016   Pap smear abnormality of cervix with ASCUS favoring benign 02/08/2016   OSA (obstructive sleep apnea) 12/28/2015   Palpitations 08/05/2015   Genital herpes 07/27/2015   Osteoporosis 03/25/2015  Dry eye 03/25/2015   Hair loss 09/18/2014   Bunion of great toe 11/15/2012   DJD (degenerative joint disease) 11/11/2012   History of gout 11/11/2012   S/P left THA, AA 09/12/2011   Hemorrhoids, external without complications 08/18/2011   Osteoarthritis of hip 03/14/2011   Renal cyst 12/15/2010   Adrenal nodule (HCC) 12/15/2010   Liver hemangioma 12/15/2010   Gall bladder disease     PCP: Pincus Sanes, MD  REFERRING PROVIDER: Benancio Deeds, MD   REFERRING DIAG:  R19.4 (ICD-10-CM) - Altered bowel habits  R15.9 (ICD-10-CM) - Incontinence of feces, unspecified fecal incontinence type  R32 (ICD-10-CM) - Urinary incontinence, unspecified type    THERAPY DIAG:  Muscle weakness (generalized)  Other lack of  coordination  Rationale for Evaluation and Treatment: Rehabilitation  ONSET DATE: 2020  SUBJECTIVE:                                                                                                                                                                                           SUBJECTIVE STATEMENT: I am able to hold the urinary leakage better. I have had some stool leakage. Stool is more continuous and not balls.    PAIN:  Are you having pain? No   PRECAUTIONS: Other: cancer  WEIGHT BEARING RESTRICTIONS: No  FALLS:  Has patient fallen in last 6 months? No  LIVING ENVIRONMENT: Lives with: lives alone  OCCUPATION: retired, Airline pilot  PLOF: Independent  PATIENT GOALS: get the leakage under control  PERTINENT HISTORY:  papillary thyroid carcinoma , IC, anterior hip replacement on left   BOWEL MOVEMENT: Pain with bowel movement: No Type of bowel movement:Type (Bristol Stool Scale) type 1 and type 2 combo or Type 4, Frequency daily, Strain No, and Splinting no Fully empty rectum: No, stool size is not as big as it used to Leakage: Yes: inch of stool when  gas being released,  Fiber supplement: No  URINATION: Pain with urination: No Fully empty bladder: Yes:   Stream: Strong Urgency: Yes: uses the pelvic floor contraction to delay Frequency: Nighttime voids 0-1; daytime voids every few hours Leakage: walking  Pads: Yes: pantyliner  INTERCOURSE: not active  PREGNANCY: Vaginal deliveries 1 Tearing Yes: episiotomy    OBJECTIVE:   DIAGNOSTIC FINDINGS:  none   COGNITION: Overall cognitive status: Within functional limits for tasks assessed     SENSATION: Light touch: Appears intact Proprioception: Appears intact   POSTURE: No Significant postural limitations  PELVIC ALIGNMENT: Pelvic alignment is correct  LUMBARAROM/PROM: Lumbar ROM is full    LOWER EXTREMITY ROM: bilateral hip ROM is full   LOWER EXTREMITY MMT:  MMT Right eval  Left eval  Hip abduction 4/5 4/5  Hip adduction 4/5 4/5   PALPATION:   General  Difficulty with fully engaging her lower abdominal.                 External Perineal Exam dryness                              Internal Pelvic Floor tightness on the sides of the introitus and anterior of the anal canal.   Patient confirms identification and approves PT to assess internal pelvic floor and treatment Yes  PELVIC MMT:   MMT eval 10/09/22 10/18/22 11/10/22 12/06/22 12/13/22 12/20/22  Vaginal 3/5 anterior and posterior, 2/5 on the sides 3/5 with circular contraction   4/5 4/5 with good hug after manual work 4/5 4/5 with good hug of therapist finger for 10 sec  Internal Anal Sphincter 3/5 except for anterior is 2/5  4/5    4/5 40 sec  External Anal Sphincter 3/5 except for anterior is 2/5  4/5    4/5 40 sec  Puborectalis 3/5  4/5    4/5 40 sec  (Blank rows = not tested)        TONE: average  PROLAPSE: none  TODAY'S TREATMENT:  01/31/23 Neuromuscular re-education: Pelvic floor contraction training: Pelvic floor EMG with surface electrodes on the anal area in sitting contract 38 sec at 20 uv, holding above 20 uv for 40 sec. For 3 times Pelvic floor EMG with surface electrodes on the vaginal area working on quick flicks at 13 uv with good control and recruitment in standing, 9 sec at 11 uv in standing, 13 sec at 13 uv, then `18 sec at 13 uv.  Exercises: Strengthening: Discussed patient HEP and for her to only work on vaginal contraction, rectal contraction and transverse abdominus    12/20/22 Manual: Internal pelvic floor techniques: No emotional/communication barriers or cognitive limitation. Patient is motivated to learn. Patient understands and agrees with treatment goals and plan. PT explains patient will be examined in standing, sitting, and lying down to see how their muscles and joints work. When they are ready, they will be asked to remove their underwear so PT can examine their  perineum. The patient is also given the option of providing their own chaperone as one is not provided in our facility. The patient also has the right and is explained the right to defer or refuse any part of the evaluation or treatment including the internal exam. With the patient's consent, PT will use one gloved finger to gently assess the muscles of the pelvic floor, seeing how well it contracts and relaxes and if there is muscle symmetry. After, the patient will get dressed and PT and patient will discuss exam findings and plan of care. PT and patient discuss plan of care, schedule, attendance policy and HEP activities. Therapist finger in the vaginal canal working on the sides of the introitus, ischiocavernosus, urogenital diaphram Therapist finger in the rectum working on the perineal body, along the puborectalis and anococcygeal ligament, and sphincter muscles  Neuromuscular re-education: Pelvic floor contraction training: Therapist finger in the vaginal canal working on circular contraction with a good hug and giving tactile cues to contract for 10 sec Therapist finger in the rectum working on circular hug of therapist finger and holding for 40 sec      12/13/22 Manual: Myofascial release: Urogenital release going through the restrictions Internal  pelvic floor techniques: No emotional/communication barriers or cognitive limitation. Patient is motivated to learn. Patient understands and agrees with treatment goals and plan. PT explains patient will be examined in standing, sitting, and lying down to see how their muscles and joints work. When they are ready, they will be asked to remove their underwear so PT can examine their perineum. The patient is also given the option of providing their own chaperone as one is not provided in our facility. The patient also has the right and is explained the right to defer or refuse any part of the evaluation or treatment including the internal exam. With the  patient's consent, PT will use one gloved finger to gently assess the muscles of the pelvic floor, seeing how well it contracts and relaxes and if there is muscle symmetry. After, the patient will get dressed and PT and patient will discuss exam findings and plan of care. PT and patient discuss plan of care, schedule, attendance policy and HEP activities.  Going through the vaginal canal working on the sides of the introitus, along the levator ani and perineal body Neuromuscular re-education: Pelvic floor contraction training: Therapist finger in the vaginal canal working on pelvic floor contraction     PATIENT EDUCATION:  10/18/22 Education details: Access Code: [email or web address], educated patient on vaginal moisturizers and how to apply Person educated: Patient Education method: Explanation, Demonstration, Tactile cues, Verbal cues, and Handouts Education comprehension: verbalized understanding, returned demonstration, verbal cues required, tactile cues required, and needs further education  HOME EXERCISE PROGRAM: 10/18/22 Access Code: [email or web address] URL: [email or web address] Date: 10/18/2022 Prepared by: Eulis Foster  Exercises - Seated Pelvic Floor Contraction  - 3 x daily - 7 x weekly - 1 sets - 10 reps - 10 sec hold - Seated Quick Flick Pelvic Floor Contractions  - 3 x daily - 7 x weekly - 1 sets - 15 reps  ASSESSMENT:  CLINICAL IMPRESSION: Patient is a 72 y.o. female who was seen today for physical therapy treatment for urinary and fecal incontinence. She is having more of the longer Type 4. Patient is having 1 bowel movement per day due to fully evacuating.  Patient only has several drops of leakage. She will go to urinate when feels several drops to leak.  Rectal and vaginal strength is 4/5. She is able to contract above 20 uv for 40 sec in sitting working the rectum. Patient has improvement of mucous leakage when passing gas. She is able to quickly contract the pelvic floor to 11  uv. She is able to contract 8-13 sec at 13 uv in standing. She has learned how to isloate the vaginal contraction from the rectum. Patient will benefit from skilled therapy to improve pelvic floor strength and coordination to reduce leakage.   OBJECTIVE IMPAIRMENTS: decreased coordination, decreased strength, and increased fascial restrictions.   ACTIVITY LIMITATIONS: bending, continence, toileting, and locomotion level  PARTICIPATION LIMITATIONS: community activity  PERSONAL FACTORS: Age and 3+ comorbidities: papillary thyroid carcinoma , IC, anterior hip replacement on left  are also affecting patient's functional outcome.   REHAB POTENTIAL: Excellent  CLINICAL DECISION MAKING: Stable/uncomplicated  EVALUATION COMPLEXITY: Low   GOALS: Goals reviewed with patient? Yes  [place] TERM GOALS: Target date: 10/29/22  Patient independent with initial pelvic floor and core exercises.  Baseline: Goal status: Met 10/18/22  2.  Patient educated on vaginal moisturizers and how to apply correctly.  Baseline:  Goal status: Met 10/18/22   LONG TERM GOALS: Target date: 04/16/23  Patient independent with  advanced HEP to improve pelvic floor and core strength to reduce urinary leakage.  Baseline:  Goal status: ongoing 01/31/23  2.  Patient reports the fecal/mucous leakage has decreased >/= 50% due to improved circular contraction and strength >/= 4/5.  Baseline: only leaks when passes gas Goal status: ongoing 01/31/23  3.  Patient is able to bend forward and walk to the bathroom without urinary leakage due to pelvic floor strength >/= 3/5 holding 10 sec.  Baseline: no leakage with bending forward, walking will leak urine Goal status: ongoing 01/31/23   PLAN:  PT FREQUENCY: 1x/week  PT DURATION: 8 weeks  PLANNED INTERVENTIONS: Therapeutic exercises, Therapeutic activity, Neuromuscular re-education, Patient/Family education, Dry Needling, Electrical stimulation, Taping, Biofeedback, and  Manual therapy  PLAN FOR NEXT SESSION:  update HEP, use the pelvic floor EMG  Eulis Foster, PT 01/31/23 11:41 AM

## 2023-02-07 ENCOUNTER — Ambulatory Visit: Payer: Medicare Other | Admitting: Physical Therapy

## 2023-02-07 ENCOUNTER — Encounter: Payer: Self-pay | Admitting: Physical Therapy

## 2023-02-07 DIAGNOSIS — M6281 Muscle weakness (generalized): Secondary | ICD-10-CM | POA: Diagnosis not present

## 2023-02-07 DIAGNOSIS — R278 Other lack of coordination: Secondary | ICD-10-CM

## 2023-02-07 NOTE — Therapy (Signed)
OUTPATIENT PHYSICAL THERAPY FEMALE PELVIC TREATMENT   Patient Name: Patricia Conley MRN: [id number] DOB:[birth year], 72 y.o., female Today's Date: 02/07/2023  END OF SESSION:  PT End of Session - 02/07/23 1148     Visit Number 9    Date for PT Re-Evaluation 04/16/23    Authorization Type Medicare    Authorization - Visit Number 9    Authorization - Number of Visits 10    PT Start Time 1145    PT Stop Time 1225    PT Time Calculation (min) 40 min    Activity Tolerance Patient tolerated treatment well    Behavior During Therapy WFL for tasks assessed/performed             Past Medical History:  Diagnosis Date   Acetabular labrum tear    right/ per pt history   Aneurysm of ascending aorta without rupture (HCC)    4 in neck and 1 in heart being watched by [facility]   Arthritis    Bradycardia    Colon polyps    DJD (degenerative joint disease)    EDS (Ehlers-Danlos syndrome)    Gall bladder disease    per pt report   Ganglion cyst    GERD (gastroesophageal reflux disease)    Hemorrhoids    History of colon polyps 07/2019   tubular adenoma   Low back pain 08/20/2016   Menopause    OSA (obstructive sleep apnea)    Osteoporosis    Pseudoaneurysm of carotid artery (HCC)    Sleep apnea    Telogen effluvium    Thyroid cancer (HCC) 2023   Thyroid disease    Past Surgical History:  Procedure Laterality Date   BREAST SURGERY  04/24/2000   cyst on Left breast   CYSTECTOMY  04/24/1997   Right elbow   HEMORRHOID BANDING  07/24/2014   JOINT REPLACEMENT     KNEE ARTHROSCOPY Left 09/10/2013   Procedure: LEFT MEDIAL AND LATERAL KNEE ARTHROSCOPY WITH MENISCAL DEBRIDEMENT ;  Surgeon: Loanne Drilling, MD;  Location: [facility];  Service: Orthopedics;  Laterality: Left;   KNEE ARTHROSCOPY Left    NASAL SINUS SURGERY  1995, 2002   THYROIDECTOMY  02/2022   thyroid cancer   TOTAL HIP ARTHROPLASTY  09/12/2011   Procedure: TOTAL HIP ARTHROPLASTY ANTERIOR APPROACH;  Surgeon:  [name], MD;  Location: [facility];  Service: Orthopedics;  Laterality: Left;   Patient Active Problem List   Diagnosis Date Noted   Right groin pain 12/20/2022   Sinus pain 10/11/2022   Headache 10/11/2022   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   EDS (Ehlers-Danlos syndrome) 01/26/2022   Carotid aneurysm, 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   Papillary thyroid carcinoma (HCC) 12/15/2021   Aneurysm of ascending aorta without rupture (HCC) 12/15/2021   Prediabetes 04/12/2021   Epidermoid cyst of finger of left hand 03/24/2021   Urinary frequency 10/05/2020   Myalgia 10/05/2020   Arthralgia 10/05/2020   Fatigue 10/04/2020   Cervical lymphadenopathy 04/16/2019   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   Burning sensation of skin, scalp 05/15/2017   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   Xerostomia 10/18/2016   Allergic rhinitis caused by mold 10/18/2016   Ganglion cyst of finger of right hand 08/08/2016   Loss of transverse plantar arch 06/12/2016   Mass of joint of finger 04/16/2016   Pap smear abnormality of cervix with ASCUS favoring benign 02/08/2016   OSA (obstructive sleep apnea) 12/28/2015   Palpitations 08/05/2015   Genital herpes 07/27/2015   Osteoporosis 03/25/2015  Dry eye 03/25/2015   Hair loss 09/18/2014   Bunion of great toe 11/15/2012   DJD (degenerative joint disease) 11/11/2012   History of gout 11/11/2012   S/P left THA, AA 09/12/2011   Hemorrhoids, external without complications 08/18/2011   Osteoarthritis of hip 03/14/2011   Renal cyst 12/15/2010   Adrenal nodule (HCC) 12/15/2010   Liver hemangioma 12/15/2010   Gall bladder disease     PCP: Pincus Sanes, MD  REFERRING PROVIDER: Benancio Deeds, MD   REFERRING DIAG:  R19.4 (ICD-10-CM) - Altered bowel habits  R15.9 (ICD-10-CM) - Incontinence of feces, unspecified fecal incontinence type  R32 (ICD-10-CM) - Urinary incontinence, unspecified type    THERAPY DIAG:  Muscle weakness (generalized)  Other lack of  coordination  Rationale for Evaluation and Treatment: Rehabilitation  ONSET DATE: 2020  SUBJECTIVE:                                                                                                                                                                                           SUBJECTIVE STATEMENT: I am on prednisone so I have had a lot of leakage. This happens when I take prednisone. I felt stronger after the last visit. I will be having physical therapy for my hip at [facility]. I see a chiropractor for my back now.   PAIN:  Are you having pain? No   PRECAUTIONS: Other: cancer  WEIGHT BEARING RESTRICTIONS: No  FALLS:  Has patient fallen in last 6 months? No  LIVING ENVIRONMENT: Lives with: lives alone  OCCUPATION: retired, Airline pilot  PLOF: Independent  PATIENT GOALS: get the leakage under control  PERTINENT HISTORY:  papillary thyroid carcinoma , IC, anterior hip replacement on left   BOWEL MOVEMENT: Pain with bowel movement: No Type of bowel movement:Type (Bristol Stool Scale) type 1 and type 2 combo or Type 4, Frequency daily, Strain No, and Splinting no Fully empty rectum: No, stool size is not as big as it used to Leakage: Yes: inch of stool when  gas being released,  Fiber supplement: No  URINATION: Pain with urination: No Fully empty bladder: Yes:   Stream: Strong Urgency: Yes: uses the pelvic floor contraction to delay Frequency: Nighttime voids 0-1; daytime voids every few hours Leakage: walking  Pads: Yes: pantyliner  INTERCOURSE: not active  PREGNANCY: Vaginal deliveries 1 Tearing Yes: episiotomy    OBJECTIVE:   DIAGNOSTIC FINDINGS:  none   COGNITION: Overall cognitive status: Within functional limits for tasks assessed     SENSATION: Light touch: Appears intact Proprioception: Appears intact   POSTURE: No Significant postural limitations  PELVIC ALIGNMENT: Pelvic alignment is correct  LUMBARAROM/PROM: Lumbar ROM is  full  LOWER EXTREMITY ROM: bilateral hip ROM is full   LOWER EXTREMITY MMT:  MMT Right eval Left eval  Hip abduction 4/5 4/5  Hip adduction 4/5 4/5   PALPATION:   General  Difficulty with fully engaging her lower abdominal.                 External Perineal Exam dryness                              Internal Pelvic Floor tightness on the sides of the introitus and anterior of the anal canal.   Patient confirms identification and approves PT to assess internal pelvic floor and treatment Yes  PELVIC MMT:   MMT eval 10/09/22 10/18/22 11/10/22 12/06/22 12/13/22 12/20/22  Vaginal 3/5 anterior and posterior, 2/5 on the sides 3/5 with circular contraction   4/5 4/5 with good hug after manual work 4/5 4/5 with good hug of therapist finger for 10 sec  Internal Anal Sphincter 3/5 except for anterior is 2/5  4/5    4/5 40 sec  External Anal Sphincter 3/5 except for anterior is 2/5  4/5    4/5 40 sec  Puborectalis 3/5  4/5    4/5 40 sec  (Blank rows = not tested)        TONE: average  PROLAPSE: none  TODAY'S TREATMENT:  02/07/23 Neuromuscular re-education: Pelvic floor contraction training: Pelvic floor EMG with surface electrodes on the anal area standing for 40 sec 35 uv x 4; sitting contraction 40 sec at 35-40 uv, standing with vaginal contraction but electrodes on rectal 10 sec 40 uv x 5; standing rectal muscles quick flick is 60 uv and good recruitment and relaxation. Stand on one leg with pelvic floor contraction alternating, pelvic floor contraction with marching;     01/31/23 Neuromuscular re-education: Pelvic floor contraction training: Pelvic floor EMG with surface electrodes on the anal area in sitting contract 38 sec at 20 uv, holding above 20 uv for 40 sec. For 3 times Pelvic floor EMG with surface electrodes on the vaginal area working on quick flicks at 13 uv with good control and recruitment in standing, 9 sec at 11 uv in standing, 13 sec at 13 uv, then `18 sec at 13 uv.   Exercises: Strengthening: Discussed patient HEP and for her to only work on vaginal contraction, rectal contraction and transverse abdominus    12/20/22 Manual: Internal pelvic floor techniques: No emotional/communication barriers or cognitive limitation. Patient is motivated to learn. Patient understands and agrees with treatment goals and plan. PT explains patient will be examined in standing, sitting, and lying down to see how their muscles and joints work. When they are ready, they will be asked to remove their underwear so PT can examine their perineum. The patient is also given the option of providing their own chaperone as one is not provided in our facility. The patient also has the right and is explained the right to defer or refuse any part of the evaluation or treatment including the internal exam. With the patient's consent, PT will use one gloved finger to gently assess the muscles of the pelvic floor, seeing how well it contracts and relaxes and if there is muscle symmetry. After, the patient will get dressed and PT and patient will discuss exam findings and plan of care. PT and patient discuss plan of care, schedule, attendance policy and HEP activities. Therapist  finger in the vaginal canal working on the sides of the introitus, ischiocavernosus, urogenital diaphram Therapist finger in the rectum working on the perineal body, along the puborectalis and anococcygeal ligament, and sphincter muscles  Neuromuscular re-education: Pelvic floor contraction training: Therapist finger in the vaginal canal working on circular contraction with a good hug and giving tactile cues to contract for 10 sec Therapist finger in the rectum working on circular hug of therapist finger and holding for 40 sec         PATIENT EDUCATION:  10/18/22 Education details: Access Code: [email or web address], educated patient on vaginal moisturizers and how to apply Person educated: Patient Education method: Explanation,  Demonstration, Tactile cues, Verbal cues, and Handouts Education comprehension: verbalized understanding, returned demonstration, verbal cues required, tactile cues required, and needs further education  HOME EXERCISE PROGRAM: 10/18/22 Access Code: [email or web address] URL: [email or web address] Date: 10/18/2022 Prepared by: Eulis Foster  Exercises - Seated Pelvic Floor Contraction  - 3 x daily - 7 x weekly - 1 sets - 10 reps - 10 sec hold - Seated Quick Flick Pelvic Floor Contractions  - 3 x daily - 7 x weekly - 1 sets - 15 reps  ASSESSMENT:  CLINICAL IMPRESSION: Patient is a 72 y.o. female who was seen today for physical therapy treatment for urinary and fecal incontinence. Patient is taking prednisone and has increased urinary and fecal leakage. This has happened in the past.  Patient is able to contract the rectum in sitting and standing for 40 seconds but the vaginal only 10 sec. She is able to recruit well and relax with quick flicks. She has improved control of the pelvic floor muscles. Patient will benefit from skilled therapy to improve pelvic floor strength and coordination to reduce leakage.   OBJECTIVE IMPAIRMENTS: decreased coordination, decreased strength, and increased fascial restrictions.   ACTIVITY LIMITATIONS: bending, continence, toileting, and locomotion level  PARTICIPATION LIMITATIONS: community activity  PERSONAL FACTORS: Age and 3+ comorbidities: papillary thyroid carcinoma , IC, anterior hip replacement on left  are also affecting patient's functional outcome.   REHAB POTENTIAL: Excellent  CLINICAL DECISION MAKING: Stable/uncomplicated  EVALUATION COMPLEXITY: Low   GOALS: Goals reviewed with patient? Yes  [place] TERM GOALS: Target date: 10/29/22  Patient independent with initial pelvic floor and core exercises.  Baseline: Goal status: Met 10/18/22  2.  Patient educated on vaginal moisturizers and how to apply correctly.  Baseline:  Goal status: Met  10/18/22   LONG TERM GOALS: Target date: 04/16/23  Patient independent with advanced HEP to improve pelvic floor and core strength to reduce urinary leakage.  Baseline:  Goal status: ongoing 01/31/23  2.  Patient reports the fecal/mucous leakage has decreased >/= 50% due to improved circular contraction and strength >/= 4/5.  Baseline: only leaks when passes gas Goal status: ongoing 01/31/23  3.  Patient is able to bend forward and walk to the bathroom without urinary leakage due to pelvic floor strength >/= 3/5 holding 10 sec.  Baseline: no leakage with bending forward, walking will leak urine Goal status: ongoing 01/31/23   PLAN:  PT FREQUENCY: 1x/week  PT DURATION: 8 weeks  PLANNED INTERVENTIONS: Therapeutic exercises, Therapeutic activity, Neuromuscular re-education, Patient/Family education, Dry Needling, Electrical stimulation, Taping, Biofeedback, and Manual therapy  PLAN FOR NEXT SESSION:  update HEP, use the pelvic floor EMG  Eulis Foster, PT 02/07/23 12:22 PM

## 2023-02-13 ENCOUNTER — Ambulatory Visit: Payer: Medicare Other | Admitting: Internal Medicine

## 2023-02-13 ENCOUNTER — Encounter: Payer: Self-pay | Admitting: Internal Medicine

## 2023-02-13 VITALS — BP 110/76 | HR 86 | Temp 98.1°F | Ht 66.0 in | Wt 141.0 lb

## 2023-02-13 DIAGNOSIS — M792 Neuralgia and neuritis, unspecified: Secondary | ICD-10-CM | POA: Diagnosis not present

## 2023-02-13 NOTE — Progress Notes (Signed)
Subjective:    Patient ID: Patricia Conley, female    DOB: [birth year], 72 y.o.   MRN: [id number]      HPI Patricia Conley is here for  Chief Complaint  Patient presents with   Numbness    Numbness on the back side on her upper left back; Patient noticed tingling as well a few weeks ago. Very painful yesterday but not as bad today. Pain shoots up to shoulder blade     Last year noticed tingling/numbness in certain area of back.  It was a larger circular area but just seemed to itch all the time.  She saw her dermatologist who stated there is no rash there.   The last 3-4 weeks has been a little painful -she states it feels like nerve ending pain.  She is currently going to the chiropractor because she throughout her right hip and after seeing sports medicine they advised having an adjustment because she felt like she was out of whack.    Medications and allergies reviewed with patient and updated if appropriate.  Current Outpatient Medications on File Prior to Visit  Medication Sig Dispense Refill   amitriptyline (ELAVIL) 10 MG tablet Take 1 tablet (10 mg total) by mouth at bedtime. (Patient taking differently: Take 10 mg by mouth at bedtime as needed.) 90 tablet 3   ASPIRIN 81 PO Take by mouth daily.     Biotin 1 MG CAPS Take by mouth.     levothyroxine (SYNTHROID) 100 MCG tablet Take 100 mcg by mouth daily before breakfast.     MAGNESIUM GLYCINATE PO Take 3 each by mouth at bedtime.     mupirocin ointment (BACTROBAN) 2 % Apply 1 Application topically 2 (two) times daily. Left maxillary sinus     valACYclovir (VALTREX) 500 MG tablet Take 1 tablet (500 mg total) by mouth daily. (Patient taking differently: Take 500 mg by mouth daily as needed.) 90 tablet 1   No current facility-administered medications on file prior to visit.    Review of Systems     Objective:   Vitals:   02/13/23 1601  BP: 110/76  Pulse: 86  Temp: 98.1 F (36.7 C)  SpO2: 96%   BP Readings from  Last 3 Encounters:  02/13/23 110/76  01/10/23 110/78  01/11/23 122/68   Wt Readings from Last 3 Encounters:  02/13/23 141 lb (64 kg)  01/11/23 141 lb 12.8 oz (64.3 kg)  01/11/23 141 lb (64 kg)   Body mass index is 22.76 kg/m.    Physical Exam Constitutional:      General: She is not in acute distress.    Appearance: Normal appearance. She is not ill-appearing.  HENT:     Head: Normocephalic and atraumatic.  Musculoskeletal:        General: No tenderness (No thoracic spine tenderness or tenderness along parathoracic muscles on left).  Skin:    General: Skin is warm and dry.     Findings: No rash.     Comments: Area of symptoms is in her left mid back to lower back-no lesions, erythema or abnormal skin  Neurological:     Mental Status: She is alert. Mental status is at baseline.  Psychiatric:        Mood and Affect: Mood normal.            Assessment & Plan:    See Problem List for Assessment and Plan of chronic medical problems.

## 2023-02-13 NOTE — Assessment & Plan Note (Signed)
Acute Symptoms she is experiencing in the left mid-lower back or nerve pain For a while she was having itching and like a burning sensation in the past few weeks it has become more of a pain that feels like nerve No rash Likely superficial nerve pain-compensation from steroid her hip out is likely why it got worse recently Seeing a chiropractor who may help Can also consider massage therapy, acupuncture Can use topical lidocaine if needed

## 2023-02-13 NOTE — Patient Instructions (Signed)
     Your symptoms are nerve related.

## 2023-02-14 ENCOUNTER — Ambulatory Visit: Payer: Medicare Other | Admitting: Physical Therapy

## 2023-02-26 ENCOUNTER — Ambulatory Visit: Payer: Medicare Other | Admitting: Physical Therapy

## 2023-02-28 ENCOUNTER — Encounter: Payer: Self-pay | Admitting: Internal Medicine

## 2023-02-28 NOTE — Progress Notes (Unsigned)
    Subjective:    Patient ID: Patricia Conley, female    DOB: 1950/10/24, 72 y.o.   MRN: [id number]      HPI Patricia Conley is here for No chief complaint on file.    Tick bite -     Medications and allergies reviewed with patient and updated if appropriate.  Current Outpatient Medications on File Prior to Visit  Medication Sig Dispense Refill   amitriptyline (ELAVIL) 10 MG tablet Take 1 tablet (10 mg total) by mouth at bedtime. (Patient taking differently: Take 10 mg by mouth at bedtime as needed.) 90 tablet 3   ASPIRIN 81 PO Take by mouth daily.     Biotin 1 MG CAPS Take by mouth.     levothyroxine (SYNTHROID) 100 MCG tablet Take 100 mcg by mouth daily before breakfast.     MAGNESIUM GLYCINATE PO Take 3 each by mouth at bedtime.     mupirocin ointment (BACTROBAN) 2 % Apply 1 Application topically 2 (two) times daily. Left maxillary sinus     valACYclovir (VALTREX) 500 MG tablet Take 1 tablet (500 mg total) by mouth daily. (Patient taking differently: Take 500 mg by mouth daily as needed.) 90 tablet 1   No current facility-administered medications on file prior to visit.    Review of Systems     Objective:  There were no vitals filed for this visit. BP Readings from Last 3 Encounters:  02/13/23 110/76  01/10/23 110/78  01/11/23 122/68   Wt Readings from Last 3 Encounters:  02/13/23 141 lb (64 kg)  01/11/23 141 lb 12.8 oz (64.3 kg)  01/11/23 141 lb (64 kg)   There is no height or weight on file to calculate BMI.    Physical Exam         Assessment & Plan:    See Problem List for Assessment and Plan of chronic medical problems.

## 2023-03-01 ENCOUNTER — Ambulatory Visit (INDEPENDENT_AMBULATORY_CARE_PROVIDER_SITE_OTHER): Payer: Medicare Other | Admitting: Internal Medicine

## 2023-03-01 VITALS — BP 112/72 | HR 81 | Temp 98.1°F | Ht 66.0 in | Wt 140.0 lb

## 2023-03-01 DIAGNOSIS — S30860A Insect bite (nonvenomous) of lower back and pelvis, initial encounter: Secondary | ICD-10-CM | POA: Insufficient documentation

## 2023-03-01 DIAGNOSIS — W57XXXA Bitten or stung by nonvenomous insect and other nonvenomous arthropods, initial encounter: Secondary | ICD-10-CM

## 2023-03-01 LAB — CBC WITH DIFFERENTIAL/PLATELET
Basophils Absolute: 0 10*3/uL (ref 0.0–0.1)
Basophils Relative: 0.8 % (ref 0.0–3.0)
Eosinophils Absolute: 0.1 10*3/uL (ref 0.0–0.7)
Eosinophils Relative: 1.9 % (ref 0.0–5.0)
HCT: 41.7 % (ref 36.0–46.0)
Hemoglobin: 13.9 g/dL (ref 12.0–15.0)
Lymphocytes Relative: 22.5 % (ref 12.0–46.0)
Lymphs Abs: 1.3 10*3/uL (ref 0.7–4.0)
MCHC: 33.3 g/dL (ref 30.0–36.0)
MCV: 89.5 fL (ref 78.0–100.0)
Monocytes Absolute: 0.5 10*3/uL (ref 0.1–1.0)
Monocytes Relative: 8.2 % (ref 3.0–12.0)
Neutro Abs: 3.8 10*3/uL (ref 1.4–7.7)
Neutrophils Relative %: 66.6 % (ref 43.0–77.0)
Platelets: 260 10*3/uL (ref 150.0–400.0)
RBC: 4.66 Mil/uL (ref 3.87–5.11)
RDW: 13.3 % (ref 11.5–15.5)
WBC: 5.7 10*3/uL (ref 4.0–10.5)

## 2023-03-01 LAB — COMPREHENSIVE METABOLIC PANEL
ALT: 16 U/L (ref 0–35)
AST: 23 U/L (ref 0–37)
Albumin: 4.8 g/dL (ref 3.5–5.2)
Alkaline Phosphatase: 69 U/L (ref 39–117)
BUN: 13 mg/dL (ref 6–23)
CO2: 28 meq/L (ref 19–32)
Calcium: 9.2 mg/dL (ref 8.4–10.5)
Chloride: 102 meq/L (ref 96–112)
Creatinine, Ser: 0.81 mg/dL (ref 0.40–1.20)
GFR: 73.75 mL/min (ref >60.00)
Glucose, Bld: 93 mg/dL (ref 70–99)
Potassium: 4.6 meq/L (ref 3.5–5.1)
Sodium: 139 meq/L (ref 135–145)
Total Bilirubin: 1 mg/dL (ref 0.2–1.2)
Total Protein: 7.2 g/dL (ref 6.0–8.3)

## 2023-03-01 NOTE — Assessment & Plan Note (Signed)
Acute Possible insect bite lower back She did have itching couple days prior and then saw a tick that was off of her No obvious bite on exam Concern regarding Lyme disease Probable low risk, but she would like to check blood work which I think is reasonable I made about his, CBC, CMP Will consider repeating blood work

## 2023-03-01 NOTE — Assessment & Plan Note (Signed)
Acute Possible tick bite of lower back Tick was found off of her so tick bite is assumed Probable low risk for tick diseases, but she would feel more comfortable if we did do blood work CBC, CMP, Lyme antibodies Discussed it may be too early to see a positive test and we will consider repeating the blood work at her upcoming physical appointment

## 2023-03-01 NOTE — Patient Instructions (Addendum)
      Blood work was ordered.   The lab is on the first floor.    Medications changes include :   none       Return for Physical Exam.

## 2023-03-03 LAB — B. BURGDORFI ANTIBODIES: B burgdorferi Ab IgG+IgM: <0.9 {index}

## 2023-03-12 ENCOUNTER — Encounter: Payer: Self-pay | Admitting: Physical Therapy

## 2023-03-14 ENCOUNTER — Encounter: Payer: Medicare Other | Admitting: Physical Therapy

## 2023-03-19 ENCOUNTER — Ambulatory Visit: Payer: Medicare Other | Admitting: Internal Medicine

## 2023-03-24 NOTE — Progress Notes (Unsigned)
Subjective:    Patient ID: Patricia Conley, female    DOB: 28-Oct-1950, 72 y.o.   MRN: [id number]     HPI Armella is here for follow up of her chronic medical problems.  Sees [facility] this week - blood work and [place] of neck.     Had episode of blood per rectum - not bright red blood.  Has pressure in rectum.     Saw the recent study that herpes could be related to alzheimer's.   Has been walking a lot.  She is a little overwhelmed by her medical problems and keeping up with doctors visits.  She has thought about going to see the therapist that we discussed before and she plans on doing that at 1 point  Medications and allergies reviewed with patient and updated if appropriate.  Current Outpatient Medications on File Prior to Visit  Medication Sig Dispense Refill   amitriptyline (ELAVIL) 10 MG tablet Take 1 tablet (10 mg total) by mouth at bedtime. (Patient taking differently: Take 10 mg by mouth at bedtime as needed.) 90 tablet 3   ASPIRIN 81 PO Take by mouth daily.     Biotin 1 MG CAPS Take by mouth.     levothyroxine (SYNTHROID) 100 MCG tablet Take 100 mcg by mouth daily before breakfast.     MAGNESIUM GLYCINATE PO Take 3 each by mouth at bedtime.     mupirocin ointment (BACTROBAN) 2 % Apply 1 Application topically 2 (two) times daily. Left maxillary sinus     valACYclovir (VALTREX) 500 MG tablet Take 1 tablet (500 mg total) by mouth daily. (Patient taking differently: Take 500 mg by mouth daily as needed.) 90 tablet 1   No current facility-administered medications on file prior to visit.     Review of Systems  Constitutional:  Negative for fever.  Respiratory:  Negative for cough, shortness of breath and wheezing.   Cardiovascular:  Positive for palpitations (occ). Negative for chest pain and leg swelling.  Musculoskeletal:  Positive for arthralgias.  Neurological:  Negative for light-headedness and headaches.       Objective:   Vitals:   03/26/23 1057  BP:  114/78  Pulse: 84  Temp: 98 F (36.7 C)  SpO2: 96%   BP Readings from Last 3 Encounters:  03/26/23 114/78  03/01/23 112/72  02/13/23 110/76   Wt Readings from Last 3 Encounters:  03/26/23 143 lb (64.9 kg)  03/01/23 140 lb (63.5 kg)  02/13/23 141 lb (64 kg)   Body mass index is 23.08 kg/m.    Physical Exam Constitutional:      General: She is not in acute distress.    Appearance: Normal appearance.  HENT:     Head: Normocephalic and atraumatic.  Eyes:     Conjunctiva/sclera: Conjunctivae normal.  Cardiovascular:     Rate and Rhythm: Normal rate and regular rhythm.     Heart sounds: Normal heart sounds.  Pulmonary:     Effort: Pulmonary effort is normal. No respiratory distress.     Breath sounds: Normal breath sounds. No wheezing.  Musculoskeletal:     Cervical back: Neck supple.     Right lower leg: No edema.     Left lower leg: No edema.  Lymphadenopathy:     Cervical: No cervical adenopathy.  Skin:    General: Skin is warm and dry.     Findings: No rash.  Neurological:     Mental Status: She is alert. Mental status is  at baseline.  Psychiatric:        Mood and Affect: Mood normal.        Behavior: Behavior normal.        Lab Results  Component Value Date   WBC 5.7 03/01/2023   HGB 13.9 03/01/2023   HCT 41.7 03/01/2023   PLT 260.0 03/01/2023   GLUCOSE 93 03/01/2023   CHOL 170 04/12/2021   TRIG 57.0 04/12/2021   HDL 63.10 04/12/2021   LDLCALC 96 04/12/2021   ALT 16 03/01/2023   AST 23 03/01/2023   NA 139 03/01/2023   K 4.6 03/01/2023   CL 102 03/01/2023   CREATININE 0.81 03/01/2023   BUN 13 03/01/2023   CO2 28 03/01/2023   TSH 1.37 04/12/2021   INR 0.98 11/20/2012   HGBA1C 6.0 04/12/2021     Assessment & Plan:    See Problem List for Assessment and Plan of chronic medical problems.

## 2023-03-24 NOTE — Patient Instructions (Addendum)
      Blood work was ordered.       Medications changes include :   None    A referral was ordered and someone will call you to schedule an appointment.     Return in about 1 year (around 03/25/2024) for follow up.

## 2023-03-26 ENCOUNTER — Ambulatory Visit (INDEPENDENT_AMBULATORY_CARE_PROVIDER_SITE_OTHER): Payer: Medicare Other | Admitting: Internal Medicine

## 2023-03-26 ENCOUNTER — Encounter: Payer: Self-pay | Admitting: Internal Medicine

## 2023-03-26 VITALS — BP 114/78 | HR 84 | Temp 98.0°F | Ht 66.0 in | Wt 143.0 lb

## 2023-03-26 DIAGNOSIS — E890 Postprocedural hypothyroidism: Secondary | ICD-10-CM | POA: Diagnosis not present

## 2023-03-26 DIAGNOSIS — M810 Age-related osteoporosis without current pathological fracture: Secondary | ICD-10-CM | POA: Diagnosis not present

## 2023-03-26 DIAGNOSIS — A6004 Herpesviral vulvovaginitis: Secondary | ICD-10-CM

## 2023-03-26 DIAGNOSIS — R7303 Prediabetes: Secondary | ICD-10-CM | POA: Diagnosis not present

## 2023-03-26 DIAGNOSIS — G4733 Obstructive sleep apnea (adult) (pediatric): Secondary | ICD-10-CM | POA: Diagnosis not present

## 2023-03-26 DIAGNOSIS — R208 Other disturbances of skin sensation: Secondary | ICD-10-CM

## 2023-03-26 LAB — HEMOGLOBIN A1C: Hgb A1c MFr Bld: 5.8 % (ref 4.6–6.5)

## 2023-03-26 LAB — VITAMIN D 25 HYDROXY (VIT D DEFICIENCY, FRACTURES): VITD: 38.96 ng/mL (ref 30.00–100.00)

## 2023-03-26 NOTE — Assessment & Plan Note (Signed)
Chronic Not as much of an issue Continue Elavil 10 mg at bedtime as needed

## 2023-03-26 NOTE — Assessment & Plan Note (Addendum)
Chronic Has not been using CPAP nightly due to sinus issues Sinus issues resolved - will retry cpap

## 2023-03-26 NOTE — Assessment & Plan Note (Signed)
Chronic Check a1c Low sugar / carb diet Stressed regular exercise  

## 2023-03-26 NOTE — Assessment & Plan Note (Addendum)
S/p total thyroidectomy secondary to papillary thyroid carcinoma Levothyroxine currently dosed by surgery Clinically euthyroid Has appointment this week for blood work and for follow-up Will be doing radioactive iodine at some point soon.

## 2023-03-26 NOTE — Assessment & Plan Note (Addendum)
Chronic Has not been taking on a daily basis, but started recently after learning that this virus could be associated with Alzheimer's Continue valtrex 500 mg daily

## 2023-03-26 NOTE — Assessment & Plan Note (Addendum)
Chronic dexa up to date Past treatment: Fosamax x 2 years, Actonel x 1 year, Fosamax x 5 years (2017-2022) Taking calcium and vitamin d Stressed regular exercise Check vitamin D level

## 2023-03-28 ENCOUNTER — Encounter: Payer: Medicare Other | Admitting: Physical Therapy

## 2023-03-28 ENCOUNTER — Ambulatory Visit (INDEPENDENT_AMBULATORY_CARE_PROVIDER_SITE_OTHER): Payer: Medicare Other | Admitting: Nurse Practitioner

## 2023-03-28 ENCOUNTER — Encounter: Payer: Self-pay | Admitting: Nurse Practitioner

## 2023-03-28 VITALS — BP 98/62 | HR 86 | Ht 66.0 in | Wt 143.0 lb

## 2023-03-28 DIAGNOSIS — Z860101 Personal history of adenomatous and serrated colon polyps: Secondary | ICD-10-CM | POA: Diagnosis not present

## 2023-03-28 DIAGNOSIS — K625 Hemorrhage of anus and rectum: Secondary | ICD-10-CM | POA: Diagnosis not present

## 2023-03-28 DIAGNOSIS — R159 Full incontinence of feces: Secondary | ICD-10-CM

## 2023-03-28 DIAGNOSIS — Z860100 Personal history of colon polyps, unspecified: Secondary | ICD-10-CM

## 2023-03-28 MED ORDER — HYDROCORTISONE (PERIANAL) 2.5 % EX CREA: 1.0000 | TOPICAL_CREAM | Freq: Every day | CUTANEOUS | 0 refills | Status: AC

## 2023-03-28 MED ORDER — NA SULFATE-K SULFATE-MG SULF 17.5-3.13-1.6 GM/177ML PO SOLN: 1.0000 | ORAL | 0 refills | Status: DC

## 2023-03-28 NOTE — Progress Notes (Signed)
Agree with assessment and plan as outlined.  

## 2023-03-28 NOTE — Patient Instructions (Addendum)
_______________________________________________________  If your blood pressure at your visit was 140/90 or greater, please contact your primary care physician to follow up on this. _______________________________________________________  If you are age 70 or older, your body mass index should be between 23-30. Your Body mass index is 23.08 kg/m. If this is out of the aforementioned range listed, please consider follow up with your Primary Care Provider. ________________________________________________________  The [facility] providers would like to encourage you to use [facility] to communicate with providers for non-urgent requests or questions.  Due to long hold times on the telephone, sending your provider a message by [facility] may be a faster and more efficient way to get a response.  Please allow 48 business hours for a response.  Please remember that this is for non-urgent requests.  _______________________________________________________  We have sent the following medications to your pharmacy for you to pick up at your convenience:  START: Anusol cream per rectum at bedtime each night for 7 days  __________________________________________________________  It has been recommended to you by your physician that you have a(n) Colonoscopy completed. Per your request, we did not schedule the procedure(s) today. Please contact our office at [phone] when you decide to have the procedure completed. As discussed, we will mail you your instructions. ___________________________________________________________________________  Due to recent changes in healthcare laws, you may see the results of your imaging and laboratory studies on MyChart before your provider has had a chance to review them.  We understand that in some cases there may be results that are confusing or concerning to you. Not all laboratory results come back in the same time frame and the provider may be waiting for multiple results  in order to interpret others.  Please give [place] 48 hours in order for your provider to thoroughly review all the results before contacting the office for clarification of your results.   Thank you for entrusting me with your care and choosing [facility].  Willette Cluster, NP

## 2023-03-28 NOTE — Progress Notes (Signed)
ASSESSMENT    Brief Narrative:  72 y.o.  female known to Dr. Adela Lank  with a past medical history not limited to papillary thyroid carcinoma, aortic aneurysm, hemorrhoids, adenomatous colon polyps  Chronic minor rectal bleeding with bowel movements. Overall painless other than sensation of anorectal "fullness" Bleeding recently increased in volume ( on tissue). No obvious fissures or rectal masses on DRE. Probably internal hemorrhoidal bleeding but she is really worried about colorectal cancer and would like to proceed with colonoscopy.   History of colon polyps.  Colonoscopy by Dr. Kinnie Scales 08/21/2019 - 1 cecal polyp - adenoma- told to repeat in 5 years   Fecal incontinence. Significantly improved with pelvic floor PT.  Also helped urinary incontinence.   See PMH below for additional history  PLAN   --Empiric Anusol cream at bedtime Q 7 days --Schedule for a colonoscopy. The risks and benefits of colonoscopy with possible polypectomy / biopsies were discussed and the patient agrees to proceed.   HPI   Chief complaint : Rectal bleeding   Patient established care here in May 2024 after her previous gastroenterologist, Dr. Kinnie Scales retired.  She was seen by Dr. Adela Lank for evaluation of altered bowel habits, mucus in stool and and fecal incontinence.  Based on rectal exam findings and her symptoms she was referred to pelvic floor PT and was started on Citrucel.  Fecal calprotectin  was normal.  Recall colonoscopy recommended sometime between April 2025 and April 2027.    Interval History:  Still having "strings" of blood and mucous in stool sometimes. This happens maybe 4-5 times a month. A few weeks ago she saw more blood on tissue than at any other time. Blood was medium red in color. This was associated with pressure like discomfort in rectum. She underwent hemorrhoidal banding by Dr. Kinnie Scales in the past. Bowel movements are normal with formed stools and no straining  She is  still going to pelvic floor PT. She is really pleased with results. Having more " complete' bowel movements. Also the urine leakage has improved.   GI History / Pertinent GI Studies  **All endoscopic studies may not be included here   Dr. Kinnie Scales - 08/21/2019 - 1 cecal polyp - adenoma- told to repeat in 5 years      Latest Ref Rng & Units 03/01/2023   10:07 AM 10/20/2022    3:11 PM 01/25/2022   10:59 AM  Hepatic Function  Total Protein 6.0 - 8.3 g/dL 7.2  6.9  6.6   Albumin 3.5 - 5.2 g/dL 4.8  4.5  4.4   AST 0 - 37 U/L 23  19  23    ALT 0 - 35 U/L 16  17  19    Alk Phosphatase 39 - 117 U/L 69  57  54   Total Bilirubin 0.2 - 1.2 mg/dL 1.0  0.8  0.6        Latest Ref Rng & Units 03/01/2023   10:07 AM 10/20/2022    3:11 PM 01/25/2022   10:59 AM  CBC  WBC 4.0 - 10.5 K/uL 5.7  7.7  4.9   Hemoglobin 12.0 - 15.0 g/dL 65.7  84.6  96.2   Hematocrit 36.0 - 46.0 % 41.7  41.1  39.6   Platelets 150.0 - 400.0 K/uL 260.0  309.0  213.0      Past Medical History:  Diagnosis Date   Acetabular labrum tear    right/ per pt history   Aneurysm of ascending aorta without rupture (  HCC)    4 in neck and 1 in heart being watched by [facility]   Arthritis    Bradycardia    Colon polyps    DJD (degenerative joint disease)    EDS (Ehlers-Danlos syndrome)    Gall bladder disease    per pt report   Ganglion cyst    GERD (gastroesophageal reflux disease)    Hemorrhoids    History of colon polyps 07/2019   tubular adenoma   Low back pain 08/20/2016   Menopause    OSA (obstructive sleep apnea)    Osteoporosis    Pseudoaneurysm of carotid artery (HCC)    Sleep apnea    Telogen effluvium    Thyroid cancer (HCC) 2023   Thyroid disease     Past Surgical History:  Procedure Laterality Date   BREAST SURGERY  04/24/2000   cyst on Left breast   CYSTECTOMY  04/24/1997   Right elbow   HEMORRHOID BANDING  07/24/2014   JOINT REPLACEMENT     KNEE ARTHROSCOPY Left 09/10/2013   Procedure: LEFT MEDIAL AND  LATERAL KNEE ARTHROSCOPY WITH MENISCAL DEBRIDEMENT ;  Surgeon: Loanne Drilling, MD;  Location: [facility];  Service: Orthopedics;  Laterality: Left;   KNEE ARTHROSCOPY Left    NASAL SINUS SURGERY  1995, 2002   THYROIDECTOMY  02/2022   thyroid cancer   TOTAL HIP ARTHROPLASTY  09/12/2011   Procedure: TOTAL HIP ARTHROPLASTY ANTERIOR APPROACH;  Surgeon: [name], MD;  Location: [facility];  Service: Orthopedics;  Laterality: Left;    Family History  Problem Relation Age of Onset   Stroke Mother    Nephrolithiasis Mother    Diabetes Mother    Aneurysm Mother        x2   Cancer Mother        lymphoma, breast cancer   Depression Father    Parkinsonism Father    Mental illness Sister        paranoia   Parkinson's disease Brother    Migraines Daughter    Other Daughter        POTS, Dysautonomia   Ehlers-Danlos syndrome Daughter    Drug abuse Nephew    Stomach cancer Neg Hx    Colon cancer Neg Hx    Esophageal cancer Neg Hx     Current Medications, Allergies, Family History and Social History were reviewed in [employer] electronic medical record.     Current Outpatient Medications  Medication Sig Dispense Refill   amitriptyline (ELAVIL) 10 MG tablet Take 1 tablet (10 mg total) by mouth at bedtime. (Patient taking differently: Take 10 mg by mouth at bedtime as needed.) 90 tablet 3   ASPIRIN 81 PO Take by mouth daily.     Biotin 1 MG CAPS Take by mouth.     levothyroxine (SYNTHROID) 100 MCG tablet Take 100 mcg by mouth daily before breakfast.     MAGNESIUM GLYCINATE PO Take 3 each by mouth at bedtime.     mupirocin ointment (BACTROBAN) 2 % Apply 1 Application topically 2 (two) times daily. Left maxillary sinus     valACYclovir (VALTREX) 500 MG tablet Take 1 tablet (500 mg total) by mouth daily. (Patient taking differently: Take 500 mg by mouth daily as needed.) 90 tablet 1   No current facility-administered medications for this visit.    Review of Systems: No chest pain. No  shortness of breath. No urinary complaints.    Physical Exam  There were no vitals filed for this visit.  Wt Readings from Last 3 Encounters:  03/26/23 143 lb (64.9 kg)  03/01/23 140 lb (63.5 kg)  02/13/23 141 lb (64 kg)    BP 98/62   Pulse 86   Ht 5\' 6"  (1.676 m)   Wt 143 lb (64.9 kg)   SpO2 96%   BMI 23.08 kg/m  Constitutional:  Pleasant, generally well appearing female in no acute distress. Psychiatric: Normal mood and affect. Behavior is normal. EENT: Pupils normal.  Conjunctivae are normal. No scleral icterus. Neck supple.  Cardiovascular: Normal rate, regular rhythm.  Pulmonary/chest: Effort normal and breath sounds normal. No wheezing, rales or rhonchi. Abdominal: Soft, nondistended, nontender. Bowel sounds active throughout. There are no masses palpable. No hepatomegaly. Rectal: perianal hemorrhoid tags. No obvious anal fissure. No masses felt on DRE. No stool or blood in vault Neurological: Alert and oriented to person place and time.    Willette Cluster, NP  03/28/2023, 10:08 AM

## 2023-03-29 ENCOUNTER — Telehealth: Payer: Self-pay | Admitting: Nurse Practitioner

## 2023-03-29 NOTE — Telephone Encounter (Signed)
Inbound call from patient requesting a call from Joni Reining to discuss scheduling colonoscopy for next week with Dr. Adela Lank. Please advise, thank you.

## 2023-03-30 NOTE — Telephone Encounter (Signed)
Patient aware what she is scheduled for 04-05-23 at 830am.  New instructions sent to patient in MyChart.  Patient verbalized understanding with no further questions.

## 2023-04-02 ENCOUNTER — Ambulatory Visit: Payer: Medicare Other | Attending: Gastroenterology | Admitting: Physical Therapy

## 2023-04-02 ENCOUNTER — Encounter: Payer: Self-pay | Admitting: Physical Therapy

## 2023-04-02 DIAGNOSIS — R278 Other lack of coordination: Secondary | ICD-10-CM | POA: Insufficient documentation

## 2023-04-02 DIAGNOSIS — M6281 Muscle weakness (generalized): Secondary | ICD-10-CM | POA: Insufficient documentation

## 2023-04-02 NOTE — Therapy (Signed)
OUTPATIENT PHYSICAL THERAPY FEMALE PELVIC TREATMENT   Patient Name: Patricia Conley MRN: [id number] DOB:May 29, 1950, 72 y.o., female Today's Date: 04/02/2023 Progress Note Reporting Period 10/02/22 to 04/02/23  See note below for Objective Data and Assessment of Progress/Goals.     END OF SESSION:  PT End of Session - 04/02/23 1146     Visit Number 10    Date for PT Re-Evaluation 04/16/23    Authorization Type Medicare    Authorization - Visit Number 10    PT Start Time 1145    PT Stop Time 1225    PT Time Calculation (min) 40 min    Activity Tolerance Patient tolerated treatment well    Behavior During Therapy WFL for tasks assessed/performed             Past Medical History:  Diagnosis Date   Acetabular labrum tear    right/ per pt history   Aneurysm of ascending aorta without rupture (HCC)    4 in neck and 1 in heart being watched by [facility]   Arthritis    Bradycardia    Colon polyps    DJD (degenerative joint disease)    EDS (Ehlers-Danlos syndrome)    Gall bladder disease    per pt report   Ganglion cyst    GERD (gastroesophageal reflux disease)    Hemorrhoids    History of colon polyps 07/2019   tubular adenoma   Low back pain 08/20/2016   Menopause    OSA (obstructive sleep apnea)    Osteoporosis    Pseudoaneurysm of carotid artery (HCC)    Sleep apnea    Telogen effluvium    Thyroid cancer (HCC) 2023   Thyroid disease    Past Surgical History:  Procedure Laterality Date   BREAST SURGERY  04/24/2000   cyst on Left breast   CYSTECTOMY  04/24/1997   Right elbow   HEMORRHOID BANDING  07/24/2014   JOINT REPLACEMENT     KNEE ARTHROSCOPY Left 09/10/2013   Procedure: LEFT MEDIAL AND LATERAL KNEE ARTHROSCOPY WITH MENISCAL DEBRIDEMENT ;  Surgeon: Loanne Drilling, MD;  Location: [facility];  Service: Orthopedics;  Laterality: Left;   KNEE ARTHROSCOPY Left    NASAL SINUS SURGERY  1995, 2002   THYROIDECTOMY  02/2022   thyroid cancer   TOTAL HIP  ARTHROPLASTY  09/12/2011   Procedure: TOTAL HIP ARTHROPLASTY ANTERIOR APPROACH;  Surgeon: [name], MD;  Location: [facility];  Service: Orthopedics;  Laterality: Left;   Patient Active Problem List   Diagnosis Date Noted   Tick bite of lower back 03/01/2023   Insect bite of lower back 03/01/2023   Neuralgia and neuritis, unspecified 02/13/2023   Right groin pain 12/20/2022   Sinus pain 10/11/2022   Headache 10/11/2022   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   EDS (Ehlers-Danlos syndrome) 01/26/2022   Carotid aneurysm, 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   Papillary thyroid carcinoma (HCC) 12/15/2021   Aneurysm of ascending aorta without rupture (HCC) 12/15/2021   Prediabetes 04/12/2021   Epidermoid cyst of finger of left hand 03/24/2021   Urinary frequency 10/05/2020   Myalgia 10/05/2020   Arthralgia 10/05/2020   Fatigue 10/04/2020   Cervical lymphadenopathy 04/16/2019   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   Burning sensation of skin, scalp 05/15/2017   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   Xerostomia 10/18/2016   Allergic rhinitis caused by mold 10/18/2016   Ganglion cyst of finger of right hand 08/08/2016   Loss of transverse plantar arch 06/12/2016   Mass of joint of  finger 04/16/2016   Pap smear abnormality of cervix with ASCUS favoring benign 02/08/2016   OSA (obstructive sleep apnea) 12/28/2015   Palpitations 08/05/2015   Genital herpes 07/27/2015   Osteoporosis 03/25/2015   Dry eye 03/25/2015   Hair loss 09/18/2014   DJD (degenerative joint disease) 11/11/2012   History of gout 11/11/2012   S/P left THA, AA 09/12/2011   Hemorrhoids, external without complications 08/18/2011   Osteoarthritis of hip 03/14/2011   Renal cyst 12/15/2010   Adrenal nodule (HCC) 12/15/2010   Liver hemangioma 12/15/2010    PCP: Pincus Sanes, MD  REFERRING PROVIDER: Benancio Deeds, MD   REFERRING DIAG:  R19.4 (ICD-10-CM) - Altered bowel habits  R15.9 (ICD-10-CM) - Incontinence of feces,  unspecified fecal incontinence type  R32 (ICD-10-CM) - Urinary incontinence, unspecified type    THERAPY DIAG:  Muscle weakness (generalized)  Other lack of coordination  Rationale for Evaluation and Treatment: Rehabilitation  ONSET DATE: 2020  SUBJECTIVE:                                                                                                                                                                                           SUBJECTIVE STATEMENT: I am 70% better. I have periodic issues of urinary and fecal leakage. I am having a colonoscopy on Thursday due to some blood in my stool. I always do better when I walk.   PAIN:  Are you having pain? No   PRECAUTIONS: Other: cancer  WEIGHT BEARING RESTRICTIONS: No  FALLS:  Has patient fallen in last 6 months? No  LIVING ENVIRONMENT: Lives with: lives alone  OCCUPATION: retired, Airline pilot  PLOF: Independent  PATIENT GOALS: get the leakage under control  PERTINENT HISTORY:  papillary thyroid carcinoma , IC, anterior hip replacement on left   BOWEL MOVEMENT: Pain with bowel movement: No Type of bowel movement:Type (Bristol Stool Scale) type 1 and type 2 combo or Type 4, Frequency daily, Strain No, and Splinting no Fully empty rectum: No, stool size is not as big as it used to Leakage: Yes: inch of stool when  gas being released,  Fiber supplement: No  URINATION: Pain with urination: No Fully empty bladder: Yes:   Stream: Strong Urgency: Yes: uses the pelvic floor contraction to delay Frequency: Nighttime voids 0-1; daytime voids every few hours Leakage: walking  Pads: Yes: pantyliner  INTERCOURSE: not active  PREGNANCY: Vaginal deliveries 1 Tearing Yes: episiotomy    OBJECTIVE:   DIAGNOSTIC FINDINGS:  none   COGNITION: Overall cognitive status: Within functional limits for tasks assessed     SENSATION: Light touch: Appears intact Proprioception: Appears intact  POSTURE: No  Significant postural limitations  PELVIC ALIGNMENT: Pelvic alignment is correct  LUMBARAROM/PROM: Lumbar ROM is full  LOWER EXTREMITY ROM: bilateral hip ROM is full   LOWER EXTREMITY MMT:  MMT Right eval Left eval Right/left 04/02/23  Hip abduction 4/5 4/5 4+/5  Hip adduction 4/5 4/5 5/5   PALPATION:   General  Difficulty with fully engaging her lower abdominal.                 External Perineal Exam dryness                              Internal Pelvic Floor tightness on the sides of the introitus and anterior of the anal canal.   Patient confirms identification and approves PT to assess internal pelvic floor and treatment Yes  PELVIC MMT:   MMT eval 10/09/22 10/18/22 11/10/22 12/06/22 12/13/22 12/20/22  Vaginal 3/5 anterior and posterior, 2/5 on the sides 3/5 with circular contraction   4/5 4/5 with good hug after manual work 4/5 4/5 with good hug of therapist finger for 10 sec  Internal Anal Sphincter 3/5 except for anterior is 2/5  4/5    4/5 40 sec  External Anal Sphincter 3/5 except for anterior is 2/5  4/5    4/5 40 sec  Puborectalis 3/5  4/5    4/5 40 sec  (Blank rows = not tested)        TONE: average  PROLAPSE: none  TODAY'S TREATMENT:  04/02/23  Neuromuscular re-education: Pelvic floor contraction training: Using the pelvic floor EMG with surface electrodes on the vaginal area Standing with quick flicks with good recruitment and relaxation Standing marching and contract to 13 uv Stand lift one leg and hold 10 uv with better control on left Side step 10 times each way at 10 uv and going the left is 12 uv Sitting contracting the pelvic floor for 10 sec at 9 uv.  Educated patient urge to void when she has to urinate and she demonstrated  Sit to stand with pelvic floor contraction    02/07/23 Neuromuscular re-education: Pelvic floor contraction training: Pelvic floor EMG with surface electrodes on the anal area standing for 40 sec 35 uv x 4; sitting  contraction 40 sec at 35-40 uv, standing with vaginal contraction but electrodes on rectal 10 sec 40 uv x 5; standing rectal muscles quick flick is 60 uv and good recruitment and relaxation. Stand on one leg with pelvic floor contraction alternating, pelvic floor contraction with marching;     01/31/23 Neuromuscular re-education: Pelvic floor contraction training: Pelvic floor EMG with surface electrodes on the anal area in sitting contract 38 sec at 20 uv, holding above 20 uv for 40 sec. For 3 times Pelvic floor EMG with surface electrodes on the vaginal area working on quick flicks at 13 uv with good control and recruitment in standing, 9 sec at 11 uv in standing, 13 sec at 13 uv, then `18 sec at 13 uv.  Exercises: Strengthening: Discussed patient HEP and for her to only work on vaginal contraction, rectal contraction and transverse abdominus          PATIENT EDUCATION:  10/18/22 Education details: Access Code: [email or web address], educated patient on vaginal moisturizers and how to apply Person educated: Patient Education method: Explanation, Demonstration, Tactile cues, Verbal cues, and Handouts Education comprehension: verbalized understanding, returned demonstration, verbal cues required, tactile cues required, and needs further education  HOME EXERCISE  PROGRAM: 10/18/22 Access Code: [email or web address] URL: [email or web address] Date: 10/18/2022 Prepared by: Eulis Foster  Exercises - Seated Pelvic Floor Contraction  - 3 x daily - 7 x weekly - 1 sets - 10 reps - 10 sec hold - Seated Quick Flick Pelvic Floor Contractions  - 3 x daily - 7 x weekly - 1 sets - 15 reps  ASSESSMENT:  CLINICAL IMPRESSION: Patient is a 72 y.o. female who was seen today for physical therapy treatment for urinary and fecal incontinence.   Patient is able to contract the rectum in sitting and standing for 40 seconds but the vaginal only 10 sec. She is able to recruit well and relax with quick flicks. Urinary  leakage is 70% better and she will leak in standing. Patient still gets the random urge to void 30 minutes after she has already urinated. Patient is getting up 2 times at night compared to 1 time. Patient does leak stool on occasion. Pelvic floor strength is 4/5.  Patient will benefit from skilled therapy to improve pelvic floor strength and coordination to reduce leakage.   OBJECTIVE IMPAIRMENTS: decreased coordination, decreased strength, and increased fascial restrictions.   ACTIVITY LIMITATIONS: bending, continence, toileting, and locomotion level  PARTICIPATION LIMITATIONS: community activity  PERSONAL FACTORS: Age and 3+ comorbidities: papillary thyroid carcinoma , IC, anterior hip replacement on left  are also affecting patient's functional outcome.   REHAB POTENTIAL: Excellent  CLINICAL DECISION MAKING: Stable/uncomplicated  EVALUATION COMPLEXITY: Low   GOALS: Goals reviewed with patient? Yes  [place] TERM GOALS: Target date: 10/29/22  Patient independent with initial pelvic floor and core exercises.  Baseline: Goal status: Met 10/18/22  2.  Patient educated on vaginal moisturizers and how to apply correctly.  Baseline:  Goal status: Met 10/18/22   LONG TERM GOALS: Target date: 04/16/23  Patient independent with advanced HEP to improve pelvic floor and core strength to reduce urinary leakage.  Baseline:  Goal status: ongoing 04/02/23  2.  Patient reports the fecal/mucous leakage has decreased >/= 50% due to improved circular contraction and strength >/= 4/5.  Baseline: only leaks when passes gas Goal status: ongoing 04/02/23  3.  Patient is able to bend forward and walk to the bathroom without urinary leakage due to pelvic floor strength >/= 3/5 holding 10 sec.  Baseline: urinary leakage is 70% better.  Goal status: ongoing 04/02/23   PLAN:  PT FREQUENCY: 1x/week  PT DURATION: 8 weeks  PLANNED INTERVENTIONS: Therapeutic exercises, Therapeutic activity,  Neuromuscular re-education, Patient/Family education, Dry Needling, Electrical stimulation, Taping, Biofeedback, and Manual therapy  PLAN FOR NEXT SESSION:  update HEP, use the pelvic floor EMG  Eulis Foster, PT 04/02/23 12:29 PM

## 2023-04-02 NOTE — Patient Instructions (Signed)
Urge Incontinence  Ideal urination frequency is every 2-4 wakeful hours, which equates to 5-8 times within a 24-hour period.   Urge incontinence is leakage that occurs when the bladder muscle contracts, creating a sudden need to go before getting to the bathroom.   Going too often when your bladder isn't actually full can disrupt the body's automatic signals to store and hold urine longer, which will increase urgency/frequency.  In this case, the bladder "is running the show" and strategies can be learned to retrain this pattern.   One should be able to control the first urge to urinate, at around .  The bladder can hold up to a "grande latte," or . To help you gain control, practice the Urge Drill below when urgency strikes.  This drill will help retrain your bladder signals and allow you to store and hold urine longer.  The overall goal is to stretch out your time between voids to reach a more manageable voiding schedule.    Practice your "quick flicks" often throughout the day (each waking hour) even when you don't need feel the urge to go.  This will help strengthen your pelvic floor muscles, making them more effective in controlling leakage.  Urge Drill  When you feel an urge to go, follow these steps to regain control: Stop what you are doing and be still Take one deep breath, directing your air into your abdomen Think an affirming thought, such as "I've got this." Do 5 quick flicks of your pelvic floor Raise your heels 5 times Walk with control to the bathroom to void, or delay voiding If you get the urge again then repeat as above.   [facility] [address], [address] [place], [place] [address] Phone # [phone] Fax [phone]

## 2023-04-03 ENCOUNTER — Encounter: Payer: Medicare Other | Admitting: Gastroenterology

## 2023-04-05 ENCOUNTER — Encounter: Payer: Self-pay | Admitting: Gastroenterology

## 2023-04-05 ENCOUNTER — Ambulatory Visit: Payer: Medicare Other | Admitting: Gastroenterology

## 2023-04-05 VITALS — BP 102/65 | HR 90 | Temp 97.9°F | Resp 13 | Ht 66.0 in | Wt 143.0 lb

## 2023-04-05 DIAGNOSIS — K6289 Other specified diseases of anus and rectum: Secondary | ICD-10-CM

## 2023-04-05 DIAGNOSIS — K625 Hemorrhage of anus and rectum: Secondary | ICD-10-CM

## 2023-04-05 DIAGNOSIS — K5730 Diverticulosis of large intestine without perforation or abscess without bleeding: Secondary | ICD-10-CM | POA: Diagnosis not present

## 2023-04-05 DIAGNOSIS — K648 Other hemorrhoids: Secondary | ICD-10-CM

## 2023-04-05 DIAGNOSIS — K644 Residual hemorrhoidal skin tags: Secondary | ICD-10-CM

## 2023-04-05 DIAGNOSIS — K6389 Other specified diseases of intestine: Secondary | ICD-10-CM | POA: Diagnosis not present

## 2023-04-05 MED ORDER — SODIUM CHLORIDE 0.9 % IV SOLN: 500.0000 mL | Freq: Once | INTRAVENOUS | Status: DC

## 2023-04-05 NOTE — Progress Notes (Signed)
Called to room to assist during endoscopic procedure.  Patient ID and intended procedure confirmed with present staff. Received instructions for my participation in the procedure from the performing physician.  

## 2023-04-05 NOTE — Progress Notes (Signed)
History and Physical Interval Note: seen on 03/28/23 - no interval changes. Colonoscopy done to evaluate rectal bleeding, suspected hemorrhoidal. Last exam with Dr. Kinnie Scales - 2021 - 1 adenoma removed.   Since the office visit has had no recurrent bleeding. It was a one time episode associated with rectal discomfort. No diarrhea or constipation, otherwise feels well today without complaints.   04/05/2023 8:25 AM  Patricia Conley  has presented today for endoscopic procedure(s), with the diagnosis of  Encounter Diagnosis  Name Primary?   Rectal bleeding Yes  .  The various methods of evaluation and treatment have been discussed with the patient and/or family. After consideration of risks, benefits and other options for treatment, the patient has consented to  the endoscopic procedure(s).   The patient's history has been reviewed, patient examined, no change in status, stable for surgery.  I have reviewed the patient's chart and labs.  Questions were answered to the patient's satisfaction.    Harlin Rain, MD [facility]

## 2023-04-05 NOTE — Op Note (Signed)
[facility] Patient Name: Patricia Conley Procedure Date: 04/05/2023 8:32 AM MRN: [id number] Endoscopist: Viviann Spare P. Adela Lank , MD, [id number] Age: 72 Referring MD:  Date of Birth: 1951-01-24 Gender: Female Account #: [id number] Procedure:                Colonoscopy Indications:              Rectal bleeding - one time episode with rectal                            pain, no recurrent symptoms since then. last                            colonoscopy Dr. Kinnie Scales - 07/2019 - adenoma Medicines:                Monitored Anesthesia Care Procedure:                Pre-Anesthesia Assessment:                           - Prior to the procedure, a History and Physical                            was performed, and patient medications and                            allergies were reviewed. The patient's tolerance of                            previous anesthesia was also reviewed. The risks                            and benefits of the procedure and the sedation                            options and risks were discussed with the patient.                            All questions were answered, and informed consent                            was obtained. Prior Anticoagulants: The patient has                            taken no anticoagulant or antiplatelet agents. ASA                            Grade Assessment: II - A patient with mild systemic                            disease. After reviewing the risks and benefits,                            the patient was deemed in satisfactory condition to  undergo the procedure.                           After obtaining informed consent, the colonoscope                            was passed under direct vision. Throughout the                            procedure, the patient's blood pressure, pulse, and                            oxygen saturations were monitored continuously. The                            [id number]-[id number]  Colonoscope [id number] was introduced                            through the anus and advanced to the the terminal                            ileum, with identification of the appendiceal                            orifice and IC valve. The colonoscopy was performed                            without difficulty. The patient tolerated the                            procedure well. The quality of the bowel                            preparation was good. The terminal ileum, ileocecal                            valve, appendiceal orifice, and rectum were                            photographed. Scope In: 8:36:19 AM Scope Out: 8:52:12 AM Scope Withdrawal Time: 0 hours 12 minutes 4 seconds  Total Procedure Duration: 0 hours 15 minutes 53 seconds  Findings:                 Skin tag was found on perianal exam.                           The terminal ileum appeared normal.                           A few small-mouthed diverticula were found in the                            left colon and right colon.  Anal papilla(e) was hypertrophied. Biopsies were                            taken with a cold forceps for histology to rule out                            AIN.                           Internal hemorrhoids were found during                            retroflexion. The hemorrhoids were small. Scarring                            from suspected prior banding noted in the distal                            rectum.                           The exam was otherwise without abnormality. Complications:            No immediate complications. Estimated blood loss:                            Minimal. Estimated Blood Loss:     Estimated blood loss was minimal. Impression:               - Perianal skin tag found on perianal exam.                           - The examined portion of the ileum was normal.                           - Diverticulosis in the left colon and in the right                             colon.                           - Anal papilla(e) was hypertrophied. Biopsied.                           - Internal hemorrhoids.                           - The examination was otherwise normal.                           No concerning pathology on this exam. Given pain                            with rectal bleeding, possble the patient could                            have  had a fissure which has since healed.                            Hemorrhoidal bleeding also possible. Recommendation:           - Patient has a contact number available for                            emergencies. The signs and symptoms of potential                            delayed complications were discussed with the                            patient. Return to normal activities tomorrow.                            Written discharge instructions were provided to the                            patient.                           - Resume previous diet.                           - Continue present medications.                           - Await pathology results. Anticipate repeat                            colonoscopy in 7 years (adenoma removed on last                            exam in 2021) Emile Kyllo P. Saben Donigan, MD 04/05/2023 8:59:47 AM This report has been signed electronically.

## 2023-04-05 NOTE — Progress Notes (Signed)
Sedate, gd SR, tolerated procedure well, VSS, report to RN 

## 2023-04-05 NOTE — Patient Instructions (Signed)
Resume previous diet and medications. Awaiting pathology results. Anticipate recall in 7 years. Handouts provided on Diverticulosis and Hemorrhoids  YOU HAD AN ENDOSCOPIC PROCEDURE TODAY AT THE [facility]:   Refer to the procedure report that was given to you for any specific questions about what was found during the examination.  If the procedure report does not answer your questions, please call your gastroenterologist to clarify.  If you requested that your care partner not be given the details of your procedure findings, then the procedure report has been included in a sealed envelope for you to review at your convenience later.  YOU SHOULD EXPECT: Some feelings of bloating in the abdomen. Passage of more gas than usual.  Walking can help get rid of the air that was put into your GI tract during the procedure and reduce the bloating. If you had a lower endoscopy (such as a colonoscopy or flexible sigmoidoscopy) you may notice spotting of blood in your stool or on the toilet paper. If you underwent a bowel prep for your procedure, you may not have a normal bowel movement for a few days.  Please Note:  You might notice some irritation and congestion in your nose or some drainage.  This is from the oxygen used during your procedure.  There is no need for concern and it should clear up in a day or so.  SYMPTOMS TO REPORT IMMEDIATELY:  Following lower endoscopy (colonoscopy or flexible sigmoidoscopy):  Excessive amounts of blood in the stool  Significant tenderness or worsening of abdominal pains  Swelling of the abdomen that is new, acute  Fever of 100F or higher  For urgent or emergent issues, a gastroenterologist can be reached at any hour by calling [phone]. Do not use MyChart messaging for urgent concerns.    DIET:  We do recommend a small meal at first, but then you may proceed to your regular diet.  Drink plenty of fluids but you should avoid alcoholic beverages for  24 hours.  ACTIVITY:  You should plan to take it easy for the rest of today and you should NOT DRIVE or use heavy machinery until tomorrow (because of the sedation medicines used during the test).    FOLLOW UP: Our staff will call the number listed on your records the next business day following your procedure.  We will call around 7:15- 8:00 am to check on you and address any questions or concerns that you may have regarding the information given to you following your procedure. If we do not reach you, we will leave a message.     If any biopsies were taken you will be contacted by phone or by letter within the next 1-3 weeks.  Please call us at [phone] if you have not heard about the biopsies in 3 weeks.    SIGNATURES/CONFIDENTIALITY: You and/or your care partner have signed paperwork which will be entered into your electronic medical record.  These signatures attest to the fact that that the information above on your After Visit Summary has been reviewed and is understood.  Full responsibility of the confidentiality of this discharge information lies with you and/or your care-partner.

## 2023-04-06 ENCOUNTER — Telehealth: Payer: Self-pay | Admitting: *Deleted

## 2023-04-06 ENCOUNTER — Telehealth: Payer: Self-pay

## 2023-04-06 NOTE — Telephone Encounter (Signed)
  Follow up Call-     04/05/2023    7:47 AM  Call back number  Post procedure Call Back phone  # [phone]  Permission to leave phone message Yes     Patient questions:  Do you have a fever, pain , or abdominal swelling? No. Pain Score  0 *  Have you tolerated food without any problems? Yes.    Have you been able to return to your normal activities? Yes.    Do you have any questions about your discharge instructions: Diet   No. Medications  No. Follow up visit  No.  Do you have questions or concerns about your Care? No.  Actions: * If pain score is 4 or above: No action needed, pain <4.

## 2023-04-06 NOTE — Telephone Encounter (Signed)
Patient returned call, requesting a call back to discuss questions she has after having her first bowel movement. Please advise.

## 2023-04-06 NOTE — Telephone Encounter (Signed)
Patient had questions because she had irritation when she was wiping after having her first BM. She inspected the area and noticed that there was a significant amount of tissue hanging out of her rectum and it looked to be irritated. She was wondering if that was ok and if she could push it back in with Vaseline. I consulted with her MD and he said it was out yesterday when her procedure started and that he pushed it back in as well. He did biopsy it which would irritate it further as well as going through the bowel prep. I relayed this to the patient and also informed her that when he gets the biopsies back someone would reach out to her and let her know she understood and had no further questions.

## 2023-04-10 LAB — SURGICAL PATHOLOGY

## 2023-04-11 ENCOUNTER — Encounter: Payer: Self-pay | Admitting: Physical Therapy

## 2023-04-11 ENCOUNTER — Ambulatory Visit: Payer: Medicare Other | Admitting: Physical Therapy

## 2023-04-11 DIAGNOSIS — M6281 Muscle weakness (generalized): Secondary | ICD-10-CM | POA: Diagnosis not present

## 2023-04-11 DIAGNOSIS — R278 Other lack of coordination: Secondary | ICD-10-CM

## 2023-04-11 NOTE — Therapy (Signed)
OUTPATIENT PHYSICAL THERAPY FEMALE PELVIC TREATMENT   Patient Name: Patricia Conley MRN: [id number] DOB:17-Sep-1950, 72 y.o., female Today's Date: 04/11/2023 Progress Note Reporting Period 10/02/22 to 04/02/23  See note below for Objective Data and Assessment of Progress/Goals.     END OF SESSION:  PT End of Session - 04/11/23 0930     Visit Number 11    Date for PT Re-Evaluation 04/16/23    Authorization Type Medicare    Authorization - Visit Number 11    Authorization - Number of Visits 20    PT Start Time 0930    PT Stop Time 1010    PT Time Calculation (min) 40 min    Activity Tolerance Patient tolerated treatment well    Behavior During Therapy WFL for tasks assessed/performed             Past Medical History:  Diagnosis Date   Acetabular labrum tear    right/ per pt history   Allergy    Aneurysm of ascending aorta without rupture (HCC)    4 in neck and 1 in heart being watched by [facility]   Arthritis    Bradycardia    Cataract    Colon polyps    DJD (degenerative joint disease)    EDS (Ehlers-Danlos syndrome)    Gall bladder disease    per pt report   Ganglion cyst    GERD (gastroesophageal reflux disease)    Hemorrhoids    History of colon polyps 07/2019   tubular adenoma   Low back pain 08/20/2016   Menopause    OSA (obstructive sleep apnea)    Osteoporosis    Pseudoaneurysm of carotid artery (HCC)    Sleep apnea    Telogen effluvium    Thyroid cancer (HCC) 2023   Thyroid disease    Past Surgical History:  Procedure Laterality Date   BREAST SURGERY  04/24/2000   cyst on Left breast   CYSTECTOMY  04/24/1997   Right elbow   HEMORRHOID BANDING  07/24/2014   JOINT REPLACEMENT     KNEE ARTHROSCOPY Left 09/10/2013   Procedure: LEFT MEDIAL AND LATERAL KNEE ARTHROSCOPY WITH MENISCAL DEBRIDEMENT ;  Surgeon: Loanne Drilling, MD;  Location: [facility];  Service: Orthopedics;  Laterality: Left;   KNEE ARTHROSCOPY Left    NASAL SINUS SURGERY  1995, 2002    THYROIDECTOMY  02/2022   thyroid cancer   TOTAL HIP ARTHROPLASTY  09/12/2011   Procedure: TOTAL HIP ARTHROPLASTY ANTERIOR APPROACH;  Surgeon: [name], MD;  Location: [facility];  Service: Orthopedics;  Laterality: Left;   Patient Active Problem List   Diagnosis Date Noted   Tick bite of lower back 03/01/2023   Insect bite of lower back 03/01/2023   Neuralgia and neuritis, unspecified 02/13/2023   Right groin pain 12/20/2022   Sinus pain 10/11/2022   Headache 10/11/2022   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   EDS (Ehlers-Danlos syndrome) 01/26/2022   Carotid aneurysm, 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   Papillary thyroid carcinoma (HCC) 12/15/2021   Aneurysm of ascending aorta without rupture (HCC) 12/15/2021   Prediabetes 04/12/2021   Epidermoid cyst of finger of left hand 03/24/2021   Urinary frequency 10/05/2020   Myalgia 10/05/2020   Arthralgia 10/05/2020   Fatigue 10/04/2020   Cervical lymphadenopathy 04/16/2019   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   Burning sensation of skin, scalp 05/15/2017   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   Xerostomia 10/18/2016   Allergic rhinitis caused by mold 10/18/2016   Ganglion cyst of finger of  right hand 08/08/2016   Loss of transverse plantar arch 06/12/2016   Mass of joint of finger 04/16/2016   Pap smear abnormality of cervix with ASCUS favoring benign 02/08/2016   OSA (obstructive sleep apnea) 12/28/2015   Palpitations 08/05/2015   Genital herpes 07/27/2015   Osteoporosis 03/25/2015   Dry eye 03/25/2015   Hair loss 09/18/2014   DJD (degenerative joint disease) 11/11/2012   History of gout 11/11/2012   S/P left THA, AA 09/12/2011   Hemorrhoids, external without complications 08/18/2011   Osteoarthritis of hip 03/14/2011   Renal cyst 12/15/2010   Adrenal nodule (HCC) 12/15/2010   Liver hemangioma 12/15/2010    PCP: Pincus Sanes, MD  REFERRING PROVIDER: Benancio Deeds, MD   REFERRING DIAG:  R19.4 (ICD-10-CM) - Altered bowel  habits  R15.9 (ICD-10-CM) - Incontinence of feces, unspecified fecal incontinence type  R32 (ICD-10-CM) - Urinary incontinence, unspecified type    THERAPY DIAG:  Muscle weakness (generalized)  Other lack of coordination  Rationale for Evaluation and Treatment: Rehabilitation  ONSET DATE: 2020  SUBJECTIVE:                                                                                                                                                                                           SUBJECTIVE STATEMENT: I had a colonoscopy and had a biopsy done. The biopsy area was sore and cause more of the lining to move out of my rectum. I feel like I am back to normal. I had stool leakage after the colonoscopy. I did the urge to void and it helped.   PAIN:  Are you having pain? No   PRECAUTIONS: Other: cancer  WEIGHT BEARING RESTRICTIONS: No  FALLS:  Has patient fallen in last 6 months? No  LIVING ENVIRONMENT: Lives with: lives alone  OCCUPATION: retired, Airline pilot  PLOF: Independent  PATIENT GOALS: get the leakage under control  PERTINENT HISTORY:  papillary thyroid carcinoma , IC, anterior hip replacement on left   BOWEL MOVEMENT: Pain with bowel movement: No Type of bowel movement:Type (Bristol Stool Scale) type 1 and type 2 combo or Type 4, Frequency daily, Strain No, and Splinting no Fully empty rectum: No, stool size is not as big as it used to Leakage: Yes: inch of stool when  gas being released,  Fiber supplement: No  URINATION: Pain with urination: No Fully empty bladder: Yes:   Stream: Strong Urgency: Yes: uses the pelvic floor contraction to delay Frequency: Nighttime voids 0-1; daytime voids every few hours Leakage: walking  Pads: Yes: pantyliner  INTERCOURSE: not active  PREGNANCY: Vaginal deliveries 1 Tearing Yes: episiotomy  OBJECTIVE:   DIAGNOSTIC FINDINGS:  none   COGNITION: Overall cognitive status: Within functional limits for  tasks assessed     SENSATION: Light touch: Appears intact Proprioception: Appears intact   POSTURE: No Significant postural limitations  PELVIC ALIGNMENT: Pelvic alignment is correct  LUMBARAROM/PROM: Lumbar ROM is full  LOWER EXTREMITY ROM: bilateral hip ROM is full   LOWER EXTREMITY MMT:  MMT Right eval Left eval Right/left 04/02/23  Hip abduction 4/5 4/5 4+/5  Hip adduction 4/5 4/5 5/5   PALPATION:   General  Difficulty with fully engaging her lower abdominal.                 External Perineal Exam dryness                              Internal Pelvic Floor tightness on the sides of the introitus and anterior of the anal canal.   Patient confirms identification and approves PT to assess internal pelvic floor and treatment Yes  PELVIC MMT:   MMT eval 10/09/22 10/18/22 11/10/22 12/06/22 12/13/22 12/20/22  Vaginal 3/5 anterior and posterior, 2/5 on the sides 3/5 with circular contraction   4/5 4/5 with good hug after manual work 4/5 4/5 with good hug of therapist finger for 10 sec  Internal Anal Sphincter 3/5 except for anterior is 2/5  4/5    4/5 40 sec  External Anal Sphincter 3/5 except for anterior is 2/5  4/5    4/5 40 sec  Puborectalis 3/5  4/5    4/5 40 sec  (Blank rows = not tested)        TONE: average  PROLAPSE: none  TODAY'S TREATMENT:  04/11/23 Exercises: Strengthening: Side step with pelvic floor contraction 10 x each way Standing on one leg 15 sec 4 times each leg with pelvic floor contraction Sitting moving 5# wt in diagonals and up and down with pelvic floor contraction Standing hip abduction machine 40# 10 x each with pelvic floor contraction Standing hip extension 40 # x each with pelvic floor contraction Leg press 50# with pelvic floor contraction    04/02/23  Neuromuscular re-education: Pelvic floor contraction training: Using the pelvic floor EMG with surface electrodes on the vaginal area Standing with quick flicks with good recruitment  and relaxation Standing marching and contract to 13 uv Stand lift one leg and hold 10 uv with better control on left Side step 10 times each way at 10 uv and going the left is 12 uv Sitting contracting the pelvic floor for 10 sec at 9 uv.  Educated patient urge to void when she has to urinate and she demonstrated  Sit to stand with pelvic floor contraction    02/07/23 Neuromuscular re-education: Pelvic floor contraction training: Pelvic floor EMG with surface electrodes on the anal area standing for 40 sec 35 uv x 4; sitting contraction 40 sec at 35-40 uv, standing with vaginal contraction but electrodes on rectal 10 sec 40 uv x 5; standing rectal muscles quick flick is 60 uv and good recruitment and relaxation. Stand on one leg with pelvic floor contraction alternating, pelvic floor contraction with marching;     PATIENT EDUCATION:  04/11/23 Education details: Access Code: [email or web address], educated patient on vaginal moisturizers and how to apply Person educated: Patient Education method: Explanation, Demonstration, Tactile cues, Verbal cues, and Handouts Education comprehension: verbalized understanding, returned demonstration, verbal cues required, tactile cues required, and needs further education  HOME  EXERCISE PROGRAM: 04/11/23 Access Code: [email or web address] URL: [email or web address] Date: 04/11/2023 Prepared by: Eulis Foster  Exercises - Seated Pelvic Floor Contraction  - 3 x daily - 7 x weekly - 1 sets - 10 reps - 10 sec hold - Seated Quick Flick Pelvic Floor Contractions  - 3 x daily - 7 x weekly - 1 sets - 15 reps - Sidestepping  - 1 x daily - 3 x weekly - 2 sets - 10 reps - Single Leg Stance  - 1 x daily - 3 x weekly - 1 sets - 2 reps - 15 sec hold - Seated Diagonal Chop with Medicine Ball  - 1 x daily - 3 x weekly - 2 sets - 10 reps - Seated Shoulder Flexion with Dumbbells  - 1 x daily - 3 x weekly - 1 sets - 10 reps - Full Leg Press  - 1 x daily - 2 x weekly - 1 sets -  10 reps   ASSESSMENT:  CLINICAL IMPRESSION: Patient is a 72 y.o. female who was seen today for physical therapy treatment for urinary and fecal incontinence.   Patient is able to contract the rectum in sitting and standing for 40 seconds but the vaginal only 10 sec. She is able to recruit well and relax with quick flicks. Urinary leakage is 75% better and she will leak in standing. Patient still gets the random urge to void 30 minutes after she has already urinated after the colonoscopy but  prior is 85% better. . Patient is getting up 2 times at night compared to 1 time. Patient does leak stool on occasion. Pelvic floor strength is 4/5.  Patient has learned exercises she is able to do at the gym. She is able to engage her lower abdominals better. She is using the urge to void behavioral technique and is helping.   OBJECTIVE IMPAIRMENTS: decreased coordination, decreased strength, and increased fascial restrictions.   ACTIVITY LIMITATIONS: bending, continence, toileting, and locomotion level  PARTICIPATION LIMITATIONS: community activity  PERSONAL FACTORS: Age and 3+ comorbidities: papillary thyroid carcinoma , IC, anterior hip replacement on left  are also affecting patient's functional outcome.   REHAB POTENTIAL: Excellent  CLINICAL DECISION MAKING: Stable/uncomplicated  EVALUATION COMPLEXITY: Low   GOALS: Goals reviewed with patient? Yes  [place] TERM GOALS: Target date: 10/29/22  Patient independent with initial pelvic floor and core exercises.  Baseline: Goal status: Met 10/18/22  2.  Patient educated on vaginal moisturizers and how to apply correctly.  Baseline:  Goal status: Met 10/18/22   LONG TERM GOALS: Target date: 04/16/23  Patient independent with advanced HEP to improve pelvic floor and core strength to reduce urinary leakage.  Baseline:  Goal status: Met 04/11/23  2.  Patient reports the fecal/mucous leakage has decreased >/= 50% due to improved circular  contraction and strength >/= 4/5.  Baseline: only leaks when passes gas Goal status: Met 04/11/23  3.  Patient is able to bend forward and walk to the bathroom without urinary leakage due to pelvic floor strength >/= 3/5 holding 10 sec.  Baseline: urinary leakage is 75% better.  Goal status: Met 04/11/23   PLAN:Discharge to HEP this visit    Eulis Foster, PT 04/11/23 9:31 AM  PHYSICAL THERAPY DISCHARGE SUMMARY  Visits from Start of Care: 11  Current functional level related to goals / functional outcomes: See above.    Remaining deficits: See above.    Education / Equipment: HEP   Patient agrees to discharge. Patient goals were met.  Patient is being discharged due to meeting the stated rehab goals.Thank you for the referral.   Eulis Foster, PT 04/11/23 9:43 AM

## 2023-04-14 ENCOUNTER — Encounter: Payer: Self-pay | Admitting: Gastroenterology

## 2023-04-20 ENCOUNTER — Encounter: Payer: Self-pay | Admitting: Physical Therapy

## 2023-05-23 LAB — HM MAMMOGRAPHY

## 2023-05-24 ENCOUNTER — Encounter: Payer: Self-pay | Admitting: Internal Medicine

## 2023-07-09 ENCOUNTER — Ambulatory Visit: Payer: Self-pay

## 2023-07-09 ENCOUNTER — Encounter: Payer: Self-pay | Admitting: Family Medicine

## 2023-07-09 ENCOUNTER — Ambulatory Visit (INDEPENDENT_AMBULATORY_CARE_PROVIDER_SITE_OTHER): Admitting: Family Medicine

## 2023-07-09 VITALS — BP 110/64 | HR 80 | Temp 97.9°F | Resp 18 | Ht 66.0 in | Wt 139.0 lb

## 2023-07-09 DIAGNOSIS — R631 Polydipsia: Secondary | ICD-10-CM

## 2023-07-09 DIAGNOSIS — R432 Parageusia: Secondary | ICD-10-CM | POA: Diagnosis not present

## 2023-07-09 DIAGNOSIS — R000 Tachycardia, unspecified: Secondary | ICD-10-CM | POA: Diagnosis not present

## 2023-07-09 DIAGNOSIS — R519 Headache, unspecified: Secondary | ICD-10-CM

## 2023-07-09 DIAGNOSIS — M2550 Pain in unspecified joint: Secondary | ICD-10-CM | POA: Diagnosis not present

## 2023-07-09 LAB — COMPREHENSIVE METABOLIC PANEL
ALT: 13 U/L (ref 0–35)
AST: 18 U/L (ref 0–37)
Albumin: 4.7 g/dL (ref 3.5–5.2)
Alkaline Phosphatase: 53 U/L (ref 39–117)
BUN: 21 mg/dL (ref 6–23)
CO2: 28 meq/L (ref 19–32)
Calcium: 9.2 mg/dL (ref 8.4–10.5)
Chloride: 105 meq/L (ref 96–112)
Creatinine, Ser: 0.79 mg/dL (ref 0.40–1.20)
GFR: 75.81 mL/min (ref >60.00)
Glucose, Bld: 90 mg/dL (ref 70–99)
Potassium: 4.7 meq/L (ref 3.5–5.1)
Sodium: 141 meq/L (ref 135–145)
Total Bilirubin: 0.8 mg/dL (ref 0.2–1.2)
Total Protein: 6.8 g/dL (ref 6.0–8.3)

## 2023-07-09 LAB — CBC WITH DIFFERENTIAL/PLATELET
Basophils Absolute: 0 10*3/uL (ref 0.0–0.1)
Basophils Relative: 0.7 % (ref 0.0–3.0)
Eosinophils Absolute: 0.1 10*3/uL (ref 0.0–0.7)
Eosinophils Relative: 1.1 % (ref 0.0–5.0)
HCT: 42.2 % (ref 36.0–46.0)
Hemoglobin: 14.2 g/dL (ref 12.0–15.0)
Lymphocytes Relative: 20.9 % (ref 12.0–46.0)
Lymphs Abs: 1.3 10*3/uL (ref 0.7–4.0)
MCHC: 33.6 g/dL (ref 30.0–36.0)
MCV: 89.5 fl (ref 78.0–100.0)
Monocytes Absolute: 0.4 10*3/uL (ref 0.1–1.0)
Monocytes Relative: 7.1 % (ref 3.0–12.0)
Neutro Abs: 4.2 10*3/uL (ref 1.4–7.7)
Neutrophils Relative %: 70.2 % (ref 43.0–77.0)
Platelets: 250 10*3/uL (ref 150.0–400.0)
RBC: 4.72 Mil/uL (ref 3.87–5.11)
RDW: 12.9 % (ref 11.5–15.5)
WBC: 6 10*3/uL (ref 4.0–10.5)

## 2023-07-09 LAB — VITAMIN B12: Vitamin B-12: 313 pg/mL (ref 211–911)

## 2023-07-09 LAB — MAGNESIUM: Magnesium: 2.2 mg/dL (ref 1.5–2.5)

## 2023-07-09 LAB — TSH: TSH: 0.16 u[IU]/mL — ABNORMAL LOW (ref 0.35–5.50)

## 2023-07-09 LAB — T4, FREE: Free T4: 1.13 ng/dL (ref 0.60–1.60)

## 2023-07-09 NOTE — Telephone Encounter (Signed)
 Copied from CRM [id number]. Topic: Clinical - Red Word Triage >> Jul 09, 2023  8:10 AM Chantha C wrote: [facility] that prompted transfer to Nurse Triage: Patient states having strange symptoms, tested for Covid twice- negative, joints pains on hands and feet and swelling, headaches, sluggish zero energy, taste is different, heart rate sitting is 90-91, standing 125. Patient denies fever, dizziness, shortness of breath, nor vision issues. Please advise [phone].   Chief Complaint: elevated HR Symptoms: joint inflammation, fatigue on hands and feet, swlling to joints, brain fog, h/o poss. Tick bite  Frequency: Friday  Pertinent Negatives: Patient denies SOB, dizziness, fever, SOB or vision issues Disposition: [] ED /[] Urgent Care (no appt availability in office) / [x] Appointment(In office/virtual)/ []  [place] [facility]/ [] Home Care/ [] Refused Recommended Disposition /[] [place] [facility]/ []  Follow-up with PCP Additional Notes: appt this am.  Reason for Disposition  Age > 60 years  (Exception: Brief heartbeat symptoms that went away and now feels well.)  Answer Assessment - Initial Assessment Questions 1. DESCRIPTION: "Please describe your heart rate or heartbeat that you are having" (e.g., fast/slow, regular/irregular, skipped or extra beats, "palpitations")     Fast HR 2. ONSET: "When did it start?" (Minutes, hours or days)      Friday  3. DURATION: "How long does it last" (e.g., seconds, minutes, hours)  4. PATTERN "Does it come and go, or has it been constant since it started?"  "Does it get worse with exertion?"   "Are you feeling it now?"     Comes and goes  6. HEART RATE: "Can you tell me your heart rate?" "How many beats in 15 seconds?"  (Note: not all patients can do this)       Standing with exertion 125 Eating made hear rate goes up  8. CAUSE: "What do you think is causing the palpitations?"     Tick bite last Tuesday Between anus and vulvar area.- has a knot felt like  inset bite  9. CARDIAC HISTORY: "Do you have any history of heart disease?" (e.g., heart attack, angina, bypass surgery, angioplasty, arrhythmia)      *No Answer* 10. OTHER SYMPTOMS: "Do you have any other symptoms?" (e.g., dizziness, chest pain, sweating, difficulty breathing)       Fatigue, joint pain, "brain was feverish, pain level 4-5, Friday joint inflamed,Brain fog  Protocols used: Heart Rate and Heartbeat Questions-A-AH

## 2023-07-09 NOTE — Progress Notes (Signed)
 Assessment & Plan:  1-5. Tachycardia (Primary)/Polyarthralgia/Increased thirst/Altered taste/New onset headaches Labs to assess for possible causes of symptoms. Encouraged to take Ibuprofen to see if this resolves some of her symptoms as she has not taken anything as of yet. - CBC with Differential/Platelet - Comprehensive metabolic panel - T4, free - TSH - Vitamin B12 - Magnesium   Follow up plan: Return if symptoms worsen or fail to improve.  Patricia Boston, MSN, APRN, FNP-C  Subjective:  HPI: Patricia Conley is a 73 y.o. female presenting on 07/09/2023 for Joint Pain (Episode on Friday and again on Sunday: joint pain, joints felt hot and swollen, felt like "brain fog", tired, elevated Heart rate at times, felt almost feverish (no fever, very thirsty, off taste in mouth. Took at home covid test - NEG)  Patient reports intermittent episodes of joint pain/swelling, headaches, feeling sluggish/zero energy, increased heart rate at times, a difference in taste, very thirsty, and almost feverish. Her first episode was three days ago and then again yesterday.  She states she can feel it coming on like she is being injected with something and it is going throughout her body.  Denies fever, dizziness, shortness of breath, and vision issues.  Reports her max heart rate was 125 with exertion.  Heart rate also increases with just eating.  Denies any irregularity to her heart beats. At home COVID test x2 were negative.  She states she has episodes that are similar to this that occur randomly due to autoimmune issues, but this just feels different. Reports mild migraines in the past.     ROS: Negative unless specifically indicated above in HPI.   Relevant past medical history reviewed and updated as indicated.   Allergies and medications reviewed and updated.   Current Outpatient Medications:    amitriptyline (ELAVIL) 10 MG tablet, Take 1 tablet (10 mg total) by mouth at bedtime. (Patient  taking differently: Take 10 mg by mouth at bedtime as needed.), Disp: 90 tablet, Rfl: 3   ASPIRIN 81 PO, Take by mouth daily., Disp: , Rfl:    Biotin 1 MG CAPS, Take by mouth., Disp: , Rfl:    fluticasone (FLONASE) 50 MCG/ACT nasal spray, Place 2 sprays into both nostrils 2 (two) times daily., Disp: , Rfl:    levothyroxine (SYNTHROID) 100 MCG tablet, Take 100 mcg by mouth daily before breakfast., Disp: , Rfl:    MAGNESIUM GLYCINATE PO, Take 3 each by mouth at bedtime., Disp: , Rfl:    mupirocin ointment (BACTROBAN) 2 %, Apply 1 Application topically 2 (two) times daily. Left maxillary sinus, Disp: , Rfl:    valACYclovir (VALTREX) 500 MG tablet, Take 1 tablet (500 mg total) by mouth daily., Disp: 90 tablet, Rfl: 1   estradiol (ESTRACE) 0.1 MG/GM vaginal cream, SMARTSIG:1 Applicator Vaginal Twice a Week (Patient not taking: Reported on 07/09/2023), Disp: , Rfl:    loratadine (CLARITIN) 10 MG tablet, Take 10 mg by mouth daily as needed for allergies (PRN per patient). As needed per patient (Patient not taking: Reported on 07/09/2023), Disp: , Rfl:   Allergies  Allergen Reactions   Histamine Rash    Flushing    Objective:   BP 110/64   Pulse 80   Temp 97.9 F (36.6 C)   Resp 18   Ht 5\' 6"  (1.676 m)   Wt 139 lb (63 kg)   SpO2 99%   BMI 22.44 kg/m    Physical Exam Vitals reviewed.  Constitutional:  General: She is not in acute distress.    Appearance: Normal appearance. She is not ill-appearing, toxic-appearing or diaphoretic.  HENT:     Head: Normocephalic and atraumatic.     Right Ear: Tympanic membrane, ear canal and external ear normal. There is no impacted cerumen.     Left Ear: Tympanic membrane, ear canal and external ear normal. There is no impacted cerumen.     Nose: Nose normal. No congestion or rhinorrhea.     Right Sinus: No maxillary sinus tenderness or frontal sinus tenderness.     Left Sinus: No maxillary sinus tenderness or frontal sinus tenderness.      Mouth/Throat:     Mouth: Mucous membranes are moist.     Pharynx: Oropharynx is clear. No oropharyngeal exudate or posterior oropharyngeal erythema.  Eyes:     General: No scleral icterus.       Right eye: No discharge.        Left eye: No discharge.     Conjunctiva/sclera: Conjunctivae normal.  Cardiovascular:     Rate and Rhythm: Normal rate and regular rhythm.     Heart sounds: Normal heart sounds. No murmur heard.    No friction rub. No gallop.  Pulmonary:     Effort: Pulmonary effort is normal. No respiratory distress.     Breath sounds: Normal breath sounds. No stridor. No wheezing, rhonchi or rales.  Musculoskeletal:        General: Normal range of motion.     Cervical back: Normal range of motion.  Lymphadenopathy:     Cervical: No cervical adenopathy.  Skin:    General: Skin is warm and dry.     Capillary Refill: Capillary refill takes less than 2 seconds.  Neurological:     General: No focal deficit present.     Mental Status: She is alert and oriented to person, place, and time. Mental status is at baseline.  Psychiatric:        Mood and Affect: Mood normal.        Behavior: Behavior normal.        Thought Content: Thought content normal.        Judgment: Judgment normal.

## 2023-07-12 ENCOUNTER — Other Ambulatory Visit

## 2023-07-12 DIAGNOSIS — R000 Tachycardia, unspecified: Secondary | ICD-10-CM

## 2023-07-13 LAB — LYME DISEASE SEROLOGY W/REFLEX: Lyme Total Antibody EIA: NEGATIVE

## 2023-07-16 ENCOUNTER — Encounter: Payer: Self-pay | Admitting: Family Medicine

## 2023-07-16 NOTE — Progress Notes (Unsigned)
    Subjective:    Patient ID: Patricia Conley, female    DOB: 1950/05/14, 73 y.o.   MRN: [id number]      HPI Patricia Conley is here for No chief complaint on file.        Medications and allergies reviewed with patient and updated if appropriate.  Current Outpatient Medications on File Prior to Visit  Medication Sig Dispense Refill   amitriptyline (ELAVIL) 10 MG tablet Take 1 tablet (10 mg total) by mouth at bedtime. (Patient taking differently: Take 10 mg by mouth at bedtime as needed.) 90 tablet 3   ASPIRIN 81 PO Take by mouth daily.     Biotin 1 MG CAPS Take by mouth.     estradiol (ESTRACE) 0.1 MG/GM vaginal cream SMARTSIG:1 Applicator Vaginal Twice a Week (Patient not taking: Reported on 07/09/2023)     fluticasone (FLONASE) 50 MCG/ACT nasal spray Place 2 sprays into both nostrils 2 (two) times daily.     levothyroxine (SYNTHROID) 100 MCG tablet Take 100 mcg by mouth daily before breakfast.     loratadine (CLARITIN) 10 MG tablet Take 10 mg by mouth daily as needed for allergies (PRN per patient). As needed per patient (Patient not taking: Reported on 07/09/2023)     MAGNESIUM GLYCINATE PO Take 3 each by mouth at bedtime.     mupirocin ointment (BACTROBAN) 2 % Apply 1 Application topically 2 (two) times daily. Left maxillary sinus     valACYclovir (VALTREX) 500 MG tablet Take 1 tablet (500 mg total) by mouth daily. 90 tablet 1   No current facility-administered medications on file prior to visit.    Review of Systems     Objective:  There were no vitals filed for this visit. BP Readings from Last 3 Encounters:  07/09/23 110/64  04/05/23 102/65  03/28/23 98/62   Wt Readings from Last 3 Encounters:  07/09/23 139 lb (63 kg)  04/05/23 143 lb (64.9 kg)  03/28/23 143 lb (64.9 kg)   There is no height or weight on file to calculate BMI.    Physical Exam         Assessment & Plan:    See Problem List for Assessment and Plan of chronic medical problems.

## 2023-07-17 ENCOUNTER — Ambulatory Visit: Attending: Internal Medicine

## 2023-07-17 ENCOUNTER — Encounter: Payer: Self-pay | Admitting: Internal Medicine

## 2023-07-17 ENCOUNTER — Ambulatory Visit (INDEPENDENT_AMBULATORY_CARE_PROVIDER_SITE_OTHER): Admitting: Internal Medicine

## 2023-07-17 VITALS — BP 108/74 | HR 84 | Temp 98.1°F | Ht 66.0 in | Wt 139.6 lb

## 2023-07-17 DIAGNOSIS — R5383 Other fatigue: Secondary | ICD-10-CM

## 2023-07-17 DIAGNOSIS — M1990 Unspecified osteoarthritis, unspecified site: Secondary | ICD-10-CM | POA: Diagnosis not present

## 2023-07-17 DIAGNOSIS — R000 Tachycardia, unspecified: Secondary | ICD-10-CM

## 2023-07-17 DIAGNOSIS — R4189 Other symptoms and signs involving cognitive functions and awareness: Secondary | ICD-10-CM | POA: Diagnosis not present

## 2023-07-17 LAB — C-REACTIVE PROTEIN: CRP: <1 mg/dL (ref 0.5–20.0)

## 2023-07-17 LAB — SEDIMENTATION RATE: Sed Rate: 1 mm/h (ref 0–30)

## 2023-07-17 NOTE — Assessment & Plan Note (Signed)
 Acute Has been experiencing intermittent heart racing sometimes after activity and sometimes at rest or after eating Heart rate has been noted to be up into the 120s at rest ?  Arrhythmia versus sinus tachycardia Holter monitor x 14 days ordered Further evaluation depending on results

## 2023-07-17 NOTE — Patient Instructions (Addendum)
      Blood work was ordered.       Medications changes include :   None    A heart monitor ordered and someone will call you to schedule an appointment.     Return if symptoms worsen or fail to improve.

## 2023-07-17 NOTE — Progress Notes (Unsigned)
 EP to read.

## 2023-07-17 NOTE — Assessment & Plan Note (Signed)
 Acute?,  Chronic? Has chronic left wrist pain that is being evaluated by orthopedics-severe synovitis concerning for inflammatory arthritis noted on MRI Some of her symptoms she is describing over the past couple of weeks consistent with possible inflammatory arthritis Will check autoimmune blood work-ESR, CRP, ANA, CCP, RF, centromere antibodies Consider referral to rheumatology-will see what blood work looks like

## 2023-07-17 NOTE — Assessment & Plan Note (Signed)
 Acute Intermittent Seems to come in waves with her other symptoms Associated with elevated heart rate which we will evaluate further ?  Related to rheumatological cause/joint pain

## 2023-07-17 NOTE — Assessment & Plan Note (Signed)
 Acute Intermittent Related to other symptoms unlikely secondary to her other symptoms

## 2023-07-19 ENCOUNTER — Encounter: Payer: Self-pay | Admitting: Internal Medicine

## 2023-07-19 DIAGNOSIS — Z0184 Encounter for antibody response examination: Secondary | ICD-10-CM

## 2023-07-19 LAB — CENTROMERE ANTIBODIES: Centromere Ab Screen: <1 AI

## 2023-07-19 LAB — ANTI-NUCLEAR AB-TITER (ANA TITER): ANA Titer 1: 1:40 {titer} — ABNORMAL HIGH

## 2023-07-19 LAB — CYCLIC CITRUL PEPTIDE ANTIBODY, IGG: Cyclic Citrullin Peptide Ab: <16 U

## 2023-07-19 LAB — RHEUMATOID FACTOR: Rheumatoid fact SerPl-aCnc: <10 [IU]/mL (ref <14)

## 2023-07-19 LAB — ANA: Anti Nuclear Antibody (ANA): POSITIVE — AB

## 2023-07-21 NOTE — Progress Notes (Unsigned)
 07/23/23- 72 yof for OSA management. 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 -Elavil HST 07/03/22- AHI 7.6/hr, desat to 88%, body weight 145 lbs CPAP auto 6-10/ [facility], replaced April, 2024. Body weight today-141 lbs Download- not current -----Has not used CPAP since 01/22/2023 due to severe sinus infection per ENT. Discussed Patricia use of AI scribe software for clinical note transcription with Patricia patient, who gave verbal consent to proceed. History of Present Illness   Patricia patient, with a history of mild sleep apnea, thyroid cancer, and aneurysms in Patricia neck, presents with concerns about Patricia use of a CPAP machine. Patricia patient had previously used Patricia machine but discontinued due to a sinus infection and other side effects, including facial puffiness and capillaries bursting in Patricia eye. Patricia patient also reported having a heavy uvula and waking up with it feeling like it had dropped during Patricia night. Patricia patient has tried different masks for Patricia CPAP machine but has not found one that is comfortable or effective. Patricia patient also has TMJ and uses a mouth guard, which has recently been adjusted due to pressure on Patricia left side. Patricia patient has been experiencing pressure from Patricia jaw to Patricia ear on Patricia right side for Patricia past two weeks, which she will be discussing with an ENT specialist. Patricia patient also mentioned having allergies to mold and mildew and keeps her apartment very dry with air purifiers running.     Prior to Admission medications   Medication Sig Start Date End Date Taking? Authorizing Provider  amitriptyline (ELAVIL) 10 MG tablet Take 1 tablet (10 mg total) by mouth at bedtime. Patient taking differently: Take 10 mg by mouth at bedtime as needed. 06/27/22  Yes Burns,  Bobette Mo, MD  ASPIRIN 81 PO Take by mouth daily.   Yes [provider]  Biotin 1 MG CAPS Take by mouth.   Yes [provider]  estradiol (ESTRACE) 0.1 MG/GM vaginal cream  05/28/23  Yes [provider]  fluticasone (FLONASE) 50 MCG/ACT nasal spray Place 2 sprays into both nostrils 2 (two) times daily. 02/28/23  Yes [provider]  levothyroxine (SYNTHROID) 100 MCG tablet Take 100 mcg by mouth daily before breakfast. 05/30/22  Yes [provider]  loratadine (CLARITIN) 10 MG tablet Take 10 mg by mouth daily as needed for allergies (PRN per patient). As needed per patient   Yes [provider]  MAGNESIUM GLYCINATE PO Take 3 each by mouth at bedtime.   Yes [provider]  mupirocin ointment (BACTROBAN) 2 % Apply 1 Application topically 2 (two) times daily. Left maxillary sinus 11/27/22  Yes [provider]  valACYclovir (VALTREX) 500 MG tablet Take 1 tablet (500 mg total) by mouth daily. 03/29/22  Yes Pincus Sanes, MD   Past Medical History:  Diagnosis Date   Acetabular labrum tear    right/ per pt history   Allergy    Aneurysm of ascending aorta without rupture (HCC)    4 in neck and 1 in heart being watched by [facility]   Arthritis    Bradycardia    Cataract    Colon polyps    DJD (degenerative joint disease)    EDS (Ehlers-Danlos syndrome)    Gall bladder disease    per pt report  Ganglion cyst    GERD (gastroesophageal reflux disease)    Hemorrhoids    History of colon polyps 07/2019   tubular adenoma   Low back pain 08/20/2016   Menopause    OSA (obstructive sleep apnea)    Osteoporosis    Pseudoaneurysm of carotid artery (HCC)    Sleep apnea    Telogen effluvium    Thyroid cancer (HCC) 2023   Thyroid disease    Past Surgical History:  Procedure Laterality Date   BREAST SURGERY  04/24/2000   cyst on Left breast   CYSTECTOMY  04/24/1997   Right elbow   HEMORRHOID BANDING  07/24/2014   JOINT REPLACEMENT      KNEE ARTHROSCOPY Left 09/10/2013   Procedure: LEFT MEDIAL AND LATERAL KNEE ARTHROSCOPY WITH MENISCAL DEBRIDEMENT ;  Surgeon: Loanne Drilling, MD;  Location: [facility];  Service: Orthopedics;  Laterality: Left;   KNEE ARTHROSCOPY Left    NASAL SINUS SURGERY  1995, 2002   THYROIDECTOMY  02/2022   thyroid cancer   TOTAL HIP ARTHROPLASTY  09/12/2011   Procedure: TOTAL HIP ARTHROPLASTY ANTERIOR APPROACH;  Surgeon: [name], MD;  Location: [facility];  Service: Orthopedics;  Laterality: Left;   Family History  Problem Relation Age of Onset   Stroke Mother    Nephrolithiasis Mother    Diabetes Mother    Aneurysm Mother        x2   Cancer Mother        lymphoma, breast cancer   Depression Father    Parkinsonism Father    Mental illness Sister        paranoia   Parkinson's disease Brother    Migraines Daughter    Other Daughter        POTS, Dysautonomia   Ehlers-Danlos syndrome Daughter    Drug abuse Nephew    Stomach cancer Neg Hx    Colon cancer Neg Hx    Esophageal cancer Neg Hx    Rectal cancer Neg Hx    Social History   Socioeconomic History   Marital status: Divorced    Spouse name: Not on file   Number of children: 1   Years of education: Not on file   Highest education level: Bachelor's degree (e.g., BA, AB, BS)  Occupational History   Occupation: Airline pilot   Occupation: retired  Tobacco Use   Smoking status: Never   Smokeless tobacco: Never  Vaping Use   Vaping status: Never Used  Substance and Sexual Activity   Alcohol use: Yes    Alcohol/week: 1.0 standard drink of alcohol    Types: 1 Standard drinks or equivalent per week    Comment: once weekly or 2-3 drinks per month   Drug use: No   Sexual activity: Not Currently  Other Topics Concern   Not on file  Social History Narrative   Caffeine: 1-2 cups/day   Social Drivers of Health   Financial Resource Strain: Low Risk  (02/12/2023)   Overall Financial Resource Strain (CARDIA)    Difficulty of Paying  Living Expenses: Not hard at all  Food Insecurity: No Food Insecurity (02/12/2023)   Hunger Vital Sign    Worried About Running Out of Food in Patricia Last Year: Never true    Ran Out of Food in Patricia Last Year: Never true  Transportation Needs: No Transportation Needs (02/12/2023)   PRAPARE - Administrator, Civil Service (Medical): No    Lack of Transportation (Non-Medical): No  Physical Activity:  Insufficiently Active (02/12/2023)   Exercise Vital Sign    Days of Exercise per Week: 3 days    Minutes of Exercise per Session: 30 min  Stress: No Stress Concern Present (02/12/2023)   [employer] of Occupational Health - Occupational Stress Questionnaire    Feeling of Stress : Only a little  Recent Concern: Stress - Stress Concern Present (12/19/2022)   [employer] of Occupational Health - Occupational Stress Questionnaire    Feeling of Stress : To some extent  Social Connections: Unknown (02/12/2023)   Social Connection and Isolation Panel [NHANES]    Frequency of Communication with Friends and Family: More than three times a week    Frequency of Social Gatherings with Friends and Family: Three times a week    Attends Religious Services: Patient declined    Active Member of Clubs or Organizations: No    Attends Banker Meetings: Never    Marital Status: Divorced  Catering manager Violence: Patient Unable To Answer (01/11/2023)   Humiliation, Afraid, Rape, and Kick questionnaire    Fear of Current or Ex-Partner: Patient unable to answer    Emotionally Abused: Patient unable to answer    Physically Abused: Patient unable to answer    Sexually Abused: Patient unable to answer   ROS-see HPI   + = positive Constitutional:    weight loss, night sweats, fevers, chills, fatigue, lassitude. HEENT:    headaches, difficulty swallowing, tooth/dental problems, sore throat,       sneezing, itching, ear ache, nasal congestion, post nasal drip, snoring CV:    chest  pain, orthopnea, PND, swelling in lower extremities, anasarca,                                   dizziness, palpitations Resp:   shortness of breath with exertion or at rest.                productive cough,   non-productive cough, coughing up of blood.              change in color of mucus.  wheezing.   Skin:    rash or lesions. GI:  No-   heartburn, indigestion, abdominal pain, nausea, vomiting, diarrhea,                 change in bowel habits, loss of appetite GU: dysuria, change in color of urine, no urgency or frequency.   flank pain. MS:   joint pain, stiffness, decreased range of motion, back pain. Neuro-     nothing unusual Psych:  change in mood or affect.  depression or anxiety.   memory loss.  OBJ- Physical Exam General- Alert, Oriented, Affect-appropriate, Distress- Conley acute, +slender Skin- rash-Conley, lesions- Conley, excoriation- Conley Lymphadenopathy- Conley Head- atraumatic            Eyes- Gross vision intact, PERRLA, conjunctivae and secretions clear            Ears- Hearing, canals-normal            Nose- Clear, no-Septal dev, mucus, polyps, erosion, perforation             Throat- Mallampati III , mucosa clear , drainage- Conley, tonsils- atrophic, +teeth Neck- flexible , trachea midline, no stridor , thyroid nl, carotid no bruit Chest - symmetrical excursion , unlabored           Heart/CV- RRR , no murmur ,  no gallop  , no rub, nl s1 s2                           - JVD- Conley , edema- Conley, stasis changes- Conley, varices- Conley           Lung- clear to P&A, wheeze- Conley, cough- Conley , dullness-Conley, rub- Conley           Chest wall-  Abd-  Br/ Gen/ Rectal- Not done, not indicated Extrem- cyanosis- Conley, clubbing, Conley, atrophy- Conley, strength- nl Neuro- grossly intact to observation  Assessment and Plan    Mild Obstructive Sleep Apnea Mild obstructive sleep apnea with prior CPAP use discontinued due to sinus infections and discomfort. Previous home sleep test showed 7  episodes per hour, which is quite mild. Concerns about impact on neck aneurysms. Discussed alternative treatments including oral appliances and surgical options like Inspire, though not suitable due to mild severity. Neurosurgeon recommended CPAP due to potential impact on aneurysm pressure, but she experienced adverse effects including sinus infections and facial puffiness. Treatment decision based on symptoms and comorbidities, particularly aneurysms. - Order updated home sleep study to assess current severity - Consider referral to consider fitted oral appliance therapy if treatment is necessary - Discuss results with neurosurgeon and vascular surgeon after updated study  Neck Aneurysms Presence of four neck aneurysms with concerns about sleep apnea impact on aneurysm pressure. Awaiting results from upcoming [facility] at [facility]. Neurosurgeon advised CPAP to mitigate pressure risks, but she experienced complications with CPAP use. Her very mild OSA may not be a significant risk. - Review scan results with vascular surgeon - Discuss implications of sleep apnea on aneurysms with neurosurgeon  Sinus Issues Sinus infections and ongoing sinus pressure, particularly on Patricia right side, possibly exacerbated by CPAP use. Symptoms may relate to Eustachian tube dysfunction or allergies. Reports pressure and pain, possibly linked to environmental allergens. - Follow up with ENT for evaluation of sinus pressure and possible Eustachian tube dysfunction - Continue sinus rinses as needed  Allergies Allergies to mold and mildew, possibly contributing to sinus issues. Symptoms worsen outdoors, suggesting environmental triggers. Maintains a dry environment with air purifiers to manage symptoms. - Maintain dry environment with air purifiers - Follow up with ENT for further allergy management  Temporomandibular Joint Disorder (TMJ) TMJ with use of a mouth guard from her family dentist.. Current mouth guard adjusted to  alleviate pressure on Patricia left side. Potential impact of oral appliance for sleep apnea on TMJ discussed, with consideration for referral to a sleep dentistry specialist for evaluation. - Continue using adjusted mouth guard for now - Discuss potential impact of oral appliance for sleep apnea on TMJ with dental specialist  Thyroid Cancer Follow-up Thyroid cancer with malignant lymph nodes removed. Under surveillance at [facility] with thyroglobulin antibody monitoring. No radioactive iodine treatment yet. Next follow-up scheduled for 09-18-23. - Continue monitoring thyroglobulin antibodies - Follow up with [facility] in 09/18/2023 for thyroid cancer surveillance

## 2023-07-23 ENCOUNTER — Encounter: Payer: Self-pay | Admitting: Internal Medicine

## 2023-07-23 ENCOUNTER — Ambulatory Visit: Payer: Medicare Other | Admitting: Internal Medicine

## 2023-07-23 VITALS — BP 102/58 | HR 69 | Temp 97.8°F | Ht 66.5 in | Wt 141.2 lb

## 2023-07-23 DIAGNOSIS — I720 Aneurysm of carotid artery: Secondary | ICD-10-CM | POA: Diagnosis not present

## 2023-07-23 DIAGNOSIS — M26609 Unspecified temporomandibular joint disorder, unspecified side: Secondary | ICD-10-CM | POA: Diagnosis not present

## 2023-07-23 DIAGNOSIS — G4733 Obstructive sleep apnea (adult) (pediatric): Secondary | ICD-10-CM

## 2023-07-23 DIAGNOSIS — J3089 Other allergic rhinitis: Secondary | ICD-10-CM | POA: Diagnosis not present

## 2023-07-23 DIAGNOSIS — J329 Chronic sinusitis, unspecified: Secondary | ICD-10-CM

## 2023-07-23 DIAGNOSIS — Z85850 Personal history of malignant neoplasm of thyroid: Secondary | ICD-10-CM

## 2023-07-23 NOTE — Patient Instructions (Signed)
 Order- schedule home sleep test             dx  OSA  Please call e about 2 weeks after your test for results and recommendations- we can consider if another trial of CPAP or referral to learn about oral appliances would be helpful.

## 2023-08-13 ENCOUNTER — Other Ambulatory Visit

## 2023-08-13 DIAGNOSIS — Z0184 Encounter for antibody response examination: Secondary | ICD-10-CM

## 2023-08-14 ENCOUNTER — Ambulatory Visit: Payer: Self-pay

## 2023-08-14 DIAGNOSIS — R002 Palpitations: Secondary | ICD-10-CM

## 2023-08-14 NOTE — Telephone Encounter (Signed)
Referral ordered for cardiology

## 2023-08-14 NOTE — Addendum Note (Signed)
 Addended by: Colene Dauphin on: 08/14/2023 12:23 PM   Modules accepted: Orders

## 2023-08-14 NOTE — Telephone Encounter (Signed)
  Chief Complaint: palpitation. HT becomes elevated to the 120's with bending over Symptoms: SOB at rest and exertion, stated "my heart feels enlarged."Mild CP yesterday  Frequency: 6 weeks, worse last Tuesday  Pertinent Negatives: Patient denies sweating, dizziness Disposition: [] ED /[] Urgent Care (no appt availability in office) / [] Appointment(In office/virtual)/ []  [facility]/ [] Home Care/ [] Refused Recommended Disposition /[] [facility]/ []  Follow-up with PCP Additional Notes: pt adamnt that she only wants a cardiology referral and the monitor she is on she asked if it could come off. Pt insistent on referral. No appt made.  Copied from CRM [id number]. Topic: Clinical - Red Word Triage >> Aug 14, 2023  9:26 AM Orien Bird wrote: [facility] that prompted transfer to Nurse Triage: Heart Palpitation is getting out of hand and patient is wanting a referral to a heart doctor. Reason for Disposition  New or worsened shortness of breath with activity (dyspnea on exertion)  Answer Assessment - Initial Assessment Questions 1. DESCRIPTION: "Please describe your heart rate or heartbeat that you are having" (e.g., fast/slow, regular/irregular, skipped or extra beats, "palpitations")     Palpitations, flutter- feels like heart is enlarged  2. ONSET: "When did it start?" (Minutes, hours or days)      6 weeks ago  3. DURATION: "How long does it last" (e.g., seconds, minutes, hours)     Comes and goes with activity, resting, eating  4. PATTERN "Does it come and go, or has it been constant since it started?"  "Does it get worse with exertion?"   "Are you feeling it now?"     worse 7. RECURRENT SYMPTOM: "Have you ever had this before?" If Yes, ask: "When was the last time?" and "What happened that time?"      ongoing 9. CARDIAC HISTORY: "Do you have any history of heart disease?" (e.g., heart attack, angina, bypass surgery, angioplasty, arrhythmia)      Tachycardia ,AAA, carotid  aneurysm 10. OTHER SYMPTOMS: "Do you have any other symptoms?" (e.g., dizziness, chest pain, sweating, difficulty breathing)       Last Tuesday palpitations worse HR jump up 30-40 points with standing. 120 just bending over/chest pain, mild chest pain yesterday, SOB with sitting and standing  Protocols used: Heart Rate and Heartbeat Questions-A-AH

## 2023-08-15 ENCOUNTER — Encounter

## 2023-08-15 ENCOUNTER — Encounter: Payer: Self-pay | Admitting: Internal Medicine

## 2023-08-15 DIAGNOSIS — G4733 Obstructive sleep apnea (adult) (pediatric): Secondary | ICD-10-CM

## 2023-08-15 LAB — SARS-COV-2 ANTIBODY (IGG), NUCLEOCAPSID, QUALITATIVE: SARS CoV 2 AB IGG: NEGATIVE

## 2023-08-30 ENCOUNTER — Encounter: Payer: Self-pay | Admitting: Internal Medicine

## 2023-08-31 ENCOUNTER — Other Ambulatory Visit: Payer: Self-pay | Admitting: Cardiology

## 2023-08-31 DIAGNOSIS — R000 Tachycardia, unspecified: Secondary | ICD-10-CM

## 2023-08-31 DIAGNOSIS — I471 Supraventricular tachycardia, unspecified: Secondary | ICD-10-CM

## 2023-09-01 ENCOUNTER — Encounter: Payer: Self-pay | Admitting: Internal Medicine

## 2023-09-02 ENCOUNTER — Telehealth: Admitting: Physician Assistant

## 2023-09-02 ENCOUNTER — Encounter: Payer: Self-pay | Admitting: Internal Medicine

## 2023-09-02 DIAGNOSIS — U071 COVID-19: Secondary | ICD-10-CM | POA: Diagnosis not present

## 2023-09-02 MED ORDER — NIRMATRELVIR/RITONAVIR (PAXLOVID)TABLET: 3.0000 | ORAL_TABLET | Freq: Two times a day (BID) | ORAL | 0 refills | Status: AC

## 2023-09-02 NOTE — Patient Instructions (Signed)
 Patricia Conley, thank you for joining Malcom Scriver, PA-C for today's virtual visit.  While this provider is not your primary care provider (PCP), if your PCP is located in our provider database this encounter information will be shared with them immediately following your visit.   A [facility] MyChart account gives you access to today's visit and all your visits, tests, and labs performed at [facility] " click here if you don't have a [facility] MyChart account or go to [email or web address]  Consent: (Patient) Patricia Conley provided verbal consent for this virtual visit at the beginning of the encounter.  Current Medications:  Current Outpatient Medications:    nirmatrelvir/ritonavir (PAXLOVID) 20 x 150 MG & 10 x 100MG  TABS, Take 3 tablets by mouth 2 (two) times daily for 5 days. (Take nirmatrelvir 150 mg two tablets twice daily for 5 days and ritonavir 100 mg one tablet twice daily for 5 days) Patient GFR is 78, Disp: 30 tablet, Rfl: 0   amitriptyline  (ELAVIL ) 10 MG tablet, Take 1 tablet (10 mg total) by mouth at bedtime. (Patient taking differently: Take 10 mg by mouth at bedtime as needed.), Disp: 90 tablet, Rfl: 3   ASPIRIN  81 PO, Take by mouth daily., Disp: , Rfl:    Biotin 1 MG CAPS, Take by mouth., Disp: , Rfl:    estradiol (ESTRACE) 0.1 MG/GM vaginal cream, , Disp: , Rfl:    fluticasone  (FLONASE ) 50 MCG/ACT nasal spray, Place 2 sprays into both nostrils 2 (two) times daily., Disp: , Rfl:    levothyroxine (SYNTHROID) 100 MCG tablet, Take 100 mcg by mouth daily before breakfast., Disp: , Rfl:    loratadine (CLARITIN) 10 MG tablet, Take 10 mg by mouth daily as needed for allergies (PRN per patient). As needed per patient, Disp: , Rfl:    MAGNESIUM GLYCINATE PO, Take 3 each by mouth at bedtime., Disp: , Rfl:    mupirocin ointment (BACTROBAN) 2 %, Apply 1 Application topically 2 (two) times daily. Left maxillary sinus, Disp: , Rfl:    valACYclovir  (VALTREX )  500 MG tablet, Take 1 tablet (500 mg total) by mouth daily., Disp: 90 tablet, Rfl: 1   Medications ordered in this encounter:  Meds ordered this encounter  Medications   nirmatrelvir/ritonavir (PAXLOVID) 20 x 150 MG & 10 x 100MG  TABS    Sig: Take 3 tablets by mouth 2 (two) times daily for 5 days. (Take nirmatrelvir 150 mg two tablets twice daily for 5 days and ritonavir 100 mg one tablet twice daily for 5 days) Patient GFR is 78    Dispense:  30 tablet    Refill:  0    Supervising Provider:   Corine Dice [phone]     *If you need refills on other medications prior to your next appointment, please contact your pharmacy*  Follow-Up: Call back or seek an in-person evaluation if the symptoms worsen or if the condition fails to improve as anticipated.  [facility] [phone]  Other Instructions  maintain adequate hydration. Seek in-person care if symptoms worsen or do not improve.  Isolate for 5 days from symptom onset  (Thursday) and wear a mask for an additional 5 days when around others.   If you have been instructed to have an in-person evaluation today at a local Urgent Care facility, please use the link below. It will take you to a list of all of our available [facility], including address, phone number and hours of  operation. Please do not delay care.  [facility]  If you or a family member do not have a primary care provider, use the link below to schedule a visit and establish care. When you choose a [facility] primary care physician or advanced practice provider, you gain a long-term partner in health. Find a Primary Care Provider  Learn more about [facility] in-office and virtual care options: [facility] - Get Care Now

## 2023-09-02 NOTE — Progress Notes (Signed)
 . Virtual Visit Consent   Patricia Conley, you are scheduled for a virtual visit with a [facility] provider today. Just as with appointments in the office, your consent must be obtained to participate. Your consent will be active for this visit and any virtual visit you may have with one of our providers in the next 365 days. If you have a MyChart account, a copy of this consent can be sent to you electronically.  As this is a virtual visit, video technology does not allow for your provider to perform a traditional examination. This may limit your provider's ability to fully assess your condition. If your provider identifies any concerns that need to be evaluated in person or the need to arrange testing (such as labs, EKG, etc.), we will make arrangements to do so. Although advances in technology are sophisticated, we cannot ensure that it will always work on either your end or our end. If the connection with a video visit is poor, the visit may have to be switched to a telephone visit. With either a video or telephone visit, we are not always able to ensure that we have a secure connection.  By engaging in this virtual visit, you consent to the provision of healthcare and authorize for your insurance to be billed (if applicable) for the services provided during this visit. Depending on your insurance coverage, you may receive a charge related to this service.  I need to obtain your verbal consent now. Are you willing to proceed with your visit today? Patricia Conley has provided verbal consent on 09/02/2023 for a virtual visit (video or telephone). Patricia Scriver, PA-C  Date: 09/02/2023 5:07 PM   Virtual Visit via Video Note   I, Patricia Conley, connected with  Patricia Conley  ([id number], 1950/06/26) on 09/02/23 at  5:00 PM EDT by a video-enabled telemedicine application and verified that I am speaking with the correct person using two identifiers.  Location: Patient: Virtual Visit  Location Patient: Home Provider: Virtual Visit Location Provider: Home Office   I discussed the limitations of evaluation and management by telemedicine and the availability of in person appointments. The patient expressed understanding and agreed to proceed.    History of Present Illness: Patricia Conley is a 73 y.o. who identifies as a female who was assigned female at birth,  presents with COVID-19 symptoms.  Symptoms began yesterday with joint pain, body aches, and fatigue, initially thought to be related to her autoimmune condition. By evening, she developed a fever of 100.19F and tested positive for COVID-19. Today, she feels worse with a fever of 100.31F despite taking ibuprofen . She reports diffuse body pain and a headache. Cold-like symptoms present in the morning have subsided.  She has not had COVID-19 before and suspects exposure [place]. She is eating and drinking well and is alone at home. She is managing symptoms with ibuprofen  and slept well last night.  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   Aneurysm of ascending aorta without rupture (HCC) 12/15/2021   Prediabetes 04/12/2021   Epidermoid cyst of finger of left hand 03/24/2021   Urinary frequency 10/05/2020   Myalgia 10/05/2020   Arthralgia 10/05/2020   Fatigue 10/04/2020   Cervical lymphadenopathy 04/16/2019   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   Burning sensation of skin, scalp 05/15/2017   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   Xerostomia 10/18/2016   Allergic rhinitis caused by mold 10/18/2016   Ganglion cyst of finger of right hand 08/08/2016   Loss of transverse plantar arch 06/12/2016   Mass of joint of finger 04/16/2016   Pap smear abnormality of cervix with ASCUS favoring benign 02/08/2016   OSA (obstructive sleep apnea) 12/28/2015   Palpitations 08/05/2015   Genital herpes 07/27/2015   Osteoporosis 03/25/2015   Dry eye 03/25/2015   Hair loss 09/18/2014   DJD (degenerative joint disease) 11/11/2012   History of gout 11/11/2012   S/P left THA, 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 (Take nirmatrelvir 150 mg two tablets twice daily for 5 days and ritonavir 100 mg one tablet twice daily for 5 days) Patient GFR is 78, Disp: 30 tablet, Rfl: 0   amitriptyline  (ELAVIL ) 10 MG tablet, Take 1 tablet (10 mg total) by mouth at bedtime. (Patient taking differently: Take 10 mg by mouth at bedtime as needed.), Disp: 90 tablet, Rfl: 3   ASPIRIN  81 PO, Take by mouth daily., Disp: , Rfl:    Biotin 1 MG CAPS, Take by mouth., Disp: , Rfl:    estradiol (ESTRACE) 0.1 MG/GM vaginal cream, , Disp: , Rfl:    fluticasone  (FLONASE ) 50 MCG/ACT nasal spray, Place 2 sprays into both nostrils 2 (two) times daily., Disp: , Rfl:    levothyroxine (SYNTHROID) 100 MCG tablet, Take 100 mcg by mouth daily before breakfast., Disp: , Rfl:    loratadine (CLARITIN) 10 MG tablet, Take 10 mg by mouth daily as needed for allergies (PRN per patient). As  needed per patient, Disp: , Rfl:    MAGNESIUM GLYCINATE PO, Take 3 each by mouth at bedtime., Disp: , Rfl:    mupirocin ointment (BACTROBAN) 2 %, Apply 1 Application topically 2 (two) times daily. Left maxillary sinus, Disp: , Rfl:    valACYclovir  (VALTREX ) 500 MG tablet, Take 1 tablet (500 mg total) by mouth daily., Disp: 90 tablet, Rfl: 1  Observations/Objective: Patient is well-developed, well-nourished in no acute distress.  Resting comfortably  at home.  Head is normocephalic, atraumatic.  No labored breathing.  Speech is clear and coherent with logical content.  Patient is alert and oriented at baseline.    Assessment and Plan: 1. COVID (Primary) - nirmatrelvir/ritonavir (PAXLOVID) 20 x 150 MG & 10 x 100MG  TABS; Take 3 tablets by mouth 2 (two) times daily for 5 days. (Take nirmatrelvir 150 mg two tablets twice daily for 5 days and ritonavir 100 mg one tablet twice daily for 5 days) Patient GFR is 78  Dispense: 30 tablet; Refill: 0   COVID-19 Acute COVID-19 infection with  symptoms including arthralgia, myalgia, fever, headache, and upper respiratory symptoms. Discussed Paxlovid benefits if started within 48 hours. Emphasized hydration and monitoring for symptom progression. - Prescribe Paxlovid - Instruct to maintain adequate hydration. - Advise to seek in-person care if symptoms worsen or do not improve. - Instruct to isolate for 5 days from symptom onset and wear a mask for an additional 5 days when around others.  Follow Up Instructions: I discussed the assessment and treatment plan with the patient. The patient was provided an opportunity to ask questions and all were answered. The patient agreed with the plan and demonstrated an understanding of the instructions.  A copy of instructions were sent to the patient via MyChart unless otherwise noted below.     The patient was advised to call back or seek an in-person evaluation if the symptoms worsen or if the condition fails to  improve as anticipated.    Etter Hermann Mayers, PA-C

## 2023-09-03 NOTE — Telephone Encounter (Signed)
Video visit done earlier today

## 2023-09-04 DIAGNOSIS — G4733 Obstructive sleep apnea (adult) (pediatric): Secondary | ICD-10-CM | POA: Diagnosis not present

## 2023-09-05 ENCOUNTER — Ambulatory Visit: Admitting: Surgery

## 2023-09-11 ENCOUNTER — Encounter: Payer: Self-pay | Admitting: Internal Medicine

## 2023-09-13 ENCOUNTER — Encounter: Payer: Self-pay | Admitting: Internal Medicine

## 2023-09-14 ENCOUNTER — Ambulatory Visit (INDEPENDENT_AMBULATORY_CARE_PROVIDER_SITE_OTHER): Admitting: Internal Medicine

## 2023-09-14 VITALS — BP 108/74 | HR 83 | Temp 98.5°F | Wt 138.0 lb

## 2023-09-14 DIAGNOSIS — J019 Acute sinusitis, unspecified: Secondary | ICD-10-CM

## 2023-09-14 MED ORDER — AMOXICILLIN-POT CLAVULANATE 875-125 MG PO TABS: 1.0000 | ORAL_TABLET | Freq: Two times a day (BID) | ORAL | 0 refills | Status: AC

## 2023-09-14 NOTE — Progress Notes (Signed)
 Subjective:    Patient ID: Patricia Conley, female    DOB: [birth year], 73 y.o.   MRN: [id number]      HPI Patricia Conley is here for  Chief Complaint  Patient presents with   Follow-up    Post COVID X10 still feel very symptotic. Tuseday 5/20 covid symptoms returned.    Tested positive for covid 5/10.  She did take paxlovid .  She did feel better.  She still after fatigued and had a headache. Three days ago it felt like she was getting a head cold.    She states chills, fatigue, low-grade fever, nasal congestion with discolored mucus, sinus pressure, dry cough and sinus headaches.  She does have a history of significant sinus infections.  She is still testing positive for COVID.She denies any shortness of breath or wheeze.   Medications and allergies reviewed with patient and updated if appropriate.  Current Outpatient Medications on File Prior to Visit  Medication Sig Dispense Refill   amitriptyline  (ELAVIL ) 10 MG tablet Take 1 tablet (10 mg total) by mouth at bedtime. (Patient taking differently: Take 10 mg by mouth at bedtime as needed.) 90 tablet 3   ASPIRIN  81 PO Take by mouth daily.     estradiol (ESTRACE) 0.1 MG/GM vaginal cream      fluticasone  (FLONASE ) 50 MCG/ACT nasal spray Place 2 sprays into both nostrils 2 (two) times daily.     levothyroxine (SYNTHROID) 100 MCG tablet Take 100 mcg by mouth daily before breakfast.     loratadine (CLARITIN) 10 MG tablet Take 10 mg by mouth daily as needed for allergies (PRN per patient). As needed per patient     MAGNESIUM GLYCINATE PO Take 3 each by mouth at bedtime.     mupirocin ointment (BACTROBAN) 2 % Apply 1 Application topically 2 (two) times daily. Left maxillary sinus     valACYclovir  (VALTREX ) 500 MG tablet Take 1 tablet (500 mg total) by mouth daily. 90 tablet 1   No current facility-administered medications on file prior to visit.    Review of Systems  Constitutional:  Positive for chills, fatigue and fever (low  grade).  HENT:  Positive for congestion (dark green mucus) and sinus pressure. Negative for ear pain and sore throat.   Respiratory:  Positive for cough. Negative for shortness of breath and wheezing.   Musculoskeletal:  Positive for myalgias.  Neurological:  Positive for headaches (sinus). Negative for dizziness and light-headedness.       Objective:   Vitals:   09/14/23 1313  BP: 108/74  Pulse: 83  Temp: 98.5 F (36.9 C)  SpO2: 98%   BP Readings from Last 3 Encounters:  09/14/23 108/74  07/23/23 (!) 102/58  07/17/23 108/74   Wt Readings from Last 3 Encounters:  09/14/23 138 lb (62.6 kg)  07/23/23 141 lb 3.2 oz (64 kg)  07/17/23 139 lb 9.6 oz (63.3 kg)   Body mass index is 21.94 kg/m.    Physical Exam Constitutional:      General: She is not in acute distress.    Appearance: Normal appearance. She is not ill-appearing.  HENT:     Head: Normocephalic and atraumatic.     Right Ear: Tympanic membrane, ear canal and external ear normal.     Left Ear: Tympanic membrane, ear canal and external ear normal.     Mouth/Throat:     Mouth: Mucous membranes are moist.     Pharynx: No oropharyngeal exudate or posterior oropharyngeal erythema.  Eyes:  Conjunctiva/sclera: Conjunctivae normal.  Cardiovascular:     Rate and Rhythm: Normal rate and regular rhythm.  Pulmonary:     Effort: Pulmonary effort is normal. No respiratory distress.     Breath sounds: Normal breath sounds. No wheezing or rales.  Musculoskeletal:     Cervical back: Neck supple. No tenderness.  Lymphadenopathy:     Cervical: No cervical adenopathy.  Skin:    General: Skin is warm and dry.  Neurological:     Mental Status: She is alert.            Assessment & Plan:    See Problem List for Assessment and Plan of chronic medical problems.

## 2023-09-14 NOTE — Assessment & Plan Note (Signed)
 Acute Still getting over covid Likely secondary bacterial  Start Augmentin  875-125 mg BID x 7 day otc cold medications Rest, fluid Call if no improvement

## 2023-09-14 NOTE — Patient Instructions (Addendum)
      Medications changes include :   Augmentin       Return if symptoms worsen or fail to improve.

## 2023-09-19 ENCOUNTER — Other Ambulatory Visit: Payer: Self-pay | Admitting: *Deleted

## 2023-09-19 ENCOUNTER — Inpatient Hospital Stay
Admission: RE | Admit: 2023-09-19 | Discharge: 2023-09-19 | Disposition: A | Discharge status: Home / Self Care | Payer: Self-pay | Source: Ambulatory Visit | Attending: Surgery | Admitting: Surgery

## 2023-09-19 DIAGNOSIS — I7121 Aneurysm of the ascending aorta, without rupture: Secondary | ICD-10-CM

## 2023-09-20 ENCOUNTER — Ambulatory Visit: Admitting: Surgery

## 2023-09-23 NOTE — Progress Notes (Signed)
 07/23/23- 72 yof for OSA management. 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 -Elavil  HST 07/03/22- AHI 7.6/hr, desat to 88%, body weight 145 lbs CPAP auto 6-10/ [employer], replaced April, 2024. Body weight today-141 lbs Download- not current -----Has not used CPAP since 01/22/2023 due to severe sinus infection per ENT. Discussed the use of AI scribe software for clinical note transcription with the patient, who gave verbal consent to proceed. History of Present Illness   The patient, with a history of mild sleep apnea, thyroid  cancer, and aneurysms in the neck, presents with concerns about the use of a CPAP machine. The patient had previously used the machine but discontinued due to a sinus infection and other side effects, including facial puffiness and capillaries bursting in the eye. The patient also reported having a heavy uvula and waking up with it feeling like it had dropped during the night. The patient has tried different masks for the CPAP machine but has not found one that is comfortable or effective. The patient also has TMJ and uses a mouth guard, which has recently been adjusted due to pressure on the left side. The patient has been experiencing pressure from the jaw to the ear on the right side for the past two weeks, which she will be discussing with an ENT specialist. The patient also mentioned having allergies to mold and mildew and keeps her apartment very dry with air purifiers running.    Assessment and Plan:    Mild Obstructive Sleep Apnea Mild obstructive sleep apnea with prior CPAP use discontinued due to sinus infections and discomfort. Previous home sleep test showed 7 episodes per hour, which is quite mild. Concerns about impact on neck aneurysms.  Discussed alternative treatments including oral appliances and surgical options like Inspire, though not suitable due to mild severity. Neurosurgeon recommended CPAP due to potential impact on aneurysm pressure, but she experienced adverse effects including sinus infections and facial puffiness. Treatment decision based on symptoms and comorbidities, particularly aneurysms. - Order updated home sleep study to assess current severity - Consider referral to consider fitted oral appliance therapy if treatment is necessary - Discuss results with neurosurgeon and vascular surgeon after updated study  Neck Aneurysms Presence of four neck aneurysms with concerns about sleep apnea impact on aneurysm pressure. Awaiting results from upcoming scan at [facility]. Neurosurgeon advised CPAP to mitigate pressure risks, but she experienced complications with CPAP use. Her very mild OSA may not be a significant risk. - Review scan results with vascular surgeon - Discuss implications of sleep apnea on aneurysms with neurosurgeon  Sinus Issues Sinus infections and ongoing sinus pressure, particularly on the right side, possibly exacerbated by CPAP use. Symptoms may relate to Eustachian tube dysfunction or allergies. Reports pressure and pain, possibly linked to environmental allergens. - Follow up with ENT for evaluation of sinus pressure and possible Eustachian tube dysfunction - Continue sinus rinses as needed  Allergies Allergies to mold and mildew, possibly contributing to sinus issues. Symptoms worsen outdoors, suggesting environmental triggers. Maintains a dry environment with air purifiers to manage symptoms. - Maintain dry environment with air purifiers - Follow up with ENT for further allergy management  Temporomandibular Joint Disorder (TMJ) TMJ with use of a mouth guard from her family dentist.. Current mouth guard adjusted to alleviate pressure on  the left side. Potential impact of oral appliance for sleep  apnea on TMJ discussed, with consideration for referral to a sleep dentistry specialist for evaluation. 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 Under surveillance at [facility] with thyroglobulin antibody monitoring. No radioactive iodine treatment yet. Next follow-up scheduled for 2023-09-26. 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 -Elavil  HST 07/03/22- AHI 7.6/hr, desat to 88%, body weight 145 lbs HST 08/15/23- (SNAP) AHI 7.5/hr, desat to 89%, body weight 140  Body weight today-139 lbs For treatment decision Dr Lucas [facility] following for thoracic aneurysm Took Paxlovid  for Covid infection May 11. Reviewed options for mild OSA. She is interested in fitted oral appliance.  ROS-see HPI   + = positive Constitutional:    weight loss, night sweats, fevers, chills, fatigue, lassitude. HEENT:    headaches, difficulty swallowing, tooth/dental problems, sore throat,       sneezing, itching, ear ache, nasal congestion, post nasal drip, snoring CV:    chest pain, orthopnea, PND, swelling in lower extremities, anasarca,                                   dizziness, palpitations Resp:   shortness of breath with exertion or at rest.                productive cough,   non-productive cough, coughing up of blood.              change in color of mucus.  wheezing.   Skin:    rash or lesions. GI:  No-   heartburn, indigestion, abdominal pain, nausea, vomiting, diarrhea,                 change in bowel habits, loss of  appetite GU: dysuria, change in color of urine, no urgency or frequency.   flank pain. MS:   joint pain, stiffness, decreased range of motion, back pain. Neuro-     nothing unusual Psych:  change in mood or affect.  depression or anxiety.   memory loss.  OBJ- Physical Exam General- Alert, Oriented, Affect-appropriate, Distress- none acute, +slender Skin- rash-none, lesions- none, excoriation- none Lymphadenopathy- none Head- atraumatic            Eyes- Gross vision intact, PERRLA, conjunctivae and secretions clear            Ears- Hearing, canals-normal            Nose- Clear, no-Septal dev, mucus, polyps, erosion, perforation             Throat- Mallampati III , mucosa clear , drainage- none, tonsils- atrophic, +teeth Neck- flexible , trachea midline, no stridor , thyroid  nl, carotid no bruit Chest - symmetrical excursion , unlabored           Heart/CV- RRR , no murmur , no gallop  , no rub, nl s1 s2                           -  JVD- none , edema- none, stasis changes- none, varices- none           Lung- clear to P&A, wheeze- none, cough- none , dullness-none, rub- none           Chest wall-  Abd-  Br/ Gen/ Rectal- Not done, not indicated Extrem- cyanosis- none, clubbing, none, atrophy- none, strength- nl Neuro- grossly intact to observation

## 2023-09-24 ENCOUNTER — Encounter: Payer: Self-pay | Admitting: Internal Medicine

## 2023-09-24 ENCOUNTER — Ambulatory Visit (INDEPENDENT_AMBULATORY_CARE_PROVIDER_SITE_OTHER): Admitting: Internal Medicine

## 2023-09-24 VITALS — BP 110/64 | HR 70 | Temp 98.0°F | Ht 66.5 in | Wt 139.8 lb

## 2023-09-24 DIAGNOSIS — G4733 Obstructive sleep apnea (adult) (pediatric): Secondary | ICD-10-CM | POA: Diagnosis not present

## 2023-09-24 NOTE — Patient Instructions (Signed)
 Order- referral to Dr Kalman Ores, DDS Orthodontist   consider oral appliance for OSA  Please call if we can help

## 2023-09-28 ENCOUNTER — Encounter: Payer: Self-pay | Admitting: Internal Medicine

## 2023-09-28 DIAGNOSIS — C73 Malignant neoplasm of thyroid gland: Secondary | ICD-10-CM

## 2023-10-09 ENCOUNTER — Telehealth: Payer: Self-pay

## 2023-10-09 ENCOUNTER — Other Ambulatory Visit: Payer: Self-pay | Admitting: *Deleted

## 2023-10-09 ENCOUNTER — Inpatient Hospital Stay
Admission: RE | Admit: 2023-10-09 | Discharge: 2023-10-09 | Disposition: A | Discharge status: Home / Self Care | Payer: Self-pay | Source: Ambulatory Visit | Attending: Surgery | Admitting: Surgery

## 2023-10-09 DIAGNOSIS — G4733 Obstructive sleep apnea (adult) (pediatric): Secondary | ICD-10-CM

## 2023-10-09 DIAGNOSIS — I7121 Aneurysm of the ascending aorta, without rupture: Secondary | ICD-10-CM

## 2023-10-09 NOTE — Telephone Encounter (Signed)
 Order was requested at OV on 09/24/2023.  Sent order on this encounter for referral to Dr. Ardell Koller for oral appliance for OSA.

## 2023-10-09 NOTE — Telephone Encounter (Signed)
 Called patient.  Put order in for referral to Dr. Ardell Koller for oral appliance on this encounter.

## 2023-10-09 NOTE — Telephone Encounter (Signed)
 Copied from CRM [id number]. Topic: Referral - Status >> Oct 09, 2023 11:31 AM Ambrose Junk wrote: Reason for CRM: Patient stated she saw  Dr Linder Revere a couple weeks ago and he was referring her to Dr Kalman Ores (?). Patient states she has not heard from office.  Please call.   Will place referral to Dr. Ardell Koller.

## 2023-10-12 ENCOUNTER — Telehealth: Payer: Self-pay | Admitting: Internal Medicine

## 2023-10-12 NOTE — Telephone Encounter (Unsigned)
 Copied from CRM [id number]. Topic: General - Other >> Oct 12, 2023  9:06 AM Patricia Conley wrote: Reason for CRM: Patient called in stated scheduled an bone density test at [facility], stated she needs Dr.Burns to send in an order to them before the appointment , Patient stated she would also like for someone to notified her once that order has been sent

## 2023-10-15 ENCOUNTER — Other Ambulatory Visit: Payer: Self-pay

## 2023-10-15 ENCOUNTER — Ambulatory Visit: Admitting: Cardiology

## 2023-10-15 DIAGNOSIS — M810 Age-related osteoporosis without current pathological fracture: Secondary | ICD-10-CM

## 2023-10-15 NOTE — Telephone Encounter (Signed)
 Message left for patient today that order has been faxed.

## 2023-10-15 NOTE — Progress Notes (Signed)
 Error

## 2023-10-24 ENCOUNTER — Ambulatory Visit: Admitting: Surgery

## 2023-10-28 ENCOUNTER — Encounter: Payer: Self-pay | Admitting: Internal Medicine

## 2023-10-28 NOTE — Assessment & Plan Note (Signed)
 Discussed conservative management vs CPAP or fitted oral appliance and she is interested in exploring oral appliance.

## 2023-11-21 LAB — HM DEXA SCAN

## 2023-11-24 ENCOUNTER — Ambulatory Visit: Payer: Self-pay | Admitting: Internal Medicine

## 2023-11-28 ENCOUNTER — Encounter: Payer: Self-pay | Admitting: Surgery

## 2023-11-28 ENCOUNTER — Ambulatory Visit: Admitting: Surgery

## 2023-11-28 ENCOUNTER — Ambulatory Visit: Attending: Surgery | Admitting: Surgery

## 2023-11-28 VITALS — BP 115/79 | HR 80 | Resp 20 | Ht 66.0 in | Wt 139.0 lb

## 2023-11-28 DIAGNOSIS — I7121 Aneurysm of the ascending aorta, without rupture: Secondary | ICD-10-CM | POA: Diagnosis not present

## 2023-12-04 NOTE — Progress Notes (Signed)
 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 Her daughter has Ehlers-Danlos syndrome and the patient has undergone genetic testing at [facility]. The etiology of her multiple aneurysms is still uncertain after genetic testing to date.  She continues to feel well.  Current Outpatient Medications  Medication Sig Dispense Refill   amitriptyline  (ELAVIL ) 10 MG tablet Take 1 tablet (10 mg total) by mouth at bedtime. (Patient taking differently: Take 10 mg by mouth at bedtime as needed.) 90 tablet 3   ASPIRIN  81 PO Take by mouth daily.     estradiol (ESTRACE) 0.1 MG/GM vaginal cream      fluticasone  (FLONASE ) 50 MCG/ACT nasal spray Place 2 sprays into both nostrils 2 (two) times daily.     levothyroxine (SYNTHROID) 100 MCG tablet Take 100 mcg by mouth daily before breakfast. (Patient taking differently: Take 100 mcg by mouth daily before breakfast. Takes 94 mcg's per day)     loratadine (CLARITIN) 10 MG tablet Take 10 mg by mouth daily as needed for allergies (PRN per patient). As needed per patient     MAGNESIUM GLYCINATE PO Take 3 each by mouth at bedtime.     mupirocin ointment (BACTROBAN) 2 % Apply 1 Application topically 2 (two) times daily. Left maxillary sinus     valACYclovir  (VALTREX ) 500 MG tablet Take 1 tablet (500 mg total) by mouth daily. 90 tablet 1   No current facility-administered medications for this visit.     Physical Exam: BP 115/79   Pulse 80   Resp 20   Ht 5' 6 (1.676 m)   Wt 139 lb (63 kg)   SpO2 96% Comment: RA  BMI 22.44 kg/m  She looks well. Cardiac exam shows a regular rate and rhythm with normal heart sounds.  There are no  murmurs. Lungs are clear. There is no peripheral edema.  Diagnostic Tests:  Indication: Aortic aneurysm, known or suspected, known ascending aortic  aneurysm, I71.21 Aneurysm of the ascending aorta, without rupture ()   Comparison: 08/02/2022   Technique: Chest CT was performed from the lung apices through the lung  bases after the uneventful IV administration of contrast.  Coronal and  sagittal reformats were obtained. Dose reduction was obtained with  Automatic Exposure Control (AEC) or, if AEC could not be utilized, by  manual adjustment of the mA and/or kV according to patient size.  Post-processing images were obtained via technologist workstation  manipulation to include MIP images although 3D reconstructions not  obtained.   Findings:   Lungs and pleura: Mild dependent atelectasis, lungs otherwise grossly  clear. There is no pleural effusion. There is no pneumothorax.  Airway: The airways are patent.  Vasculature/Esophagus: The esophagus is unremarkable. The great vessels  arise from the aortic arch in a bovine configuration and are patent and  normal caliber. The ascending thoracic aorta measures approximately 3.2 cm  on coronal imaging. The thoracic aorta is patent and normal caliber. The  thoracic aorta is normal in caliber. The main pulmonary artery is normal in  caliber.  Heart: The heart appears enlarged without pericardial effusion. No  significant  coronary atherosclerotic calcifications.  Lymph nodes: No significantly enlarged lymph nodes in the mediastinum,  hilar and axillary regions.  Thyroid : There is normal attenuation.  Bones:  Mild degenerative changes of the thoracic spine. There are no lytic  or blastic lesions.  Visualized upper abdomen: Unremarkable  Soft tissues: Unremarkable.   Impression:  1.  No acute/inflammatory findings of the chest  2.  The thoracic aorta is patent and normal caliber.   Electronically Signed by:  Eva Saba, MD, [facility]  Electronically Signed on:  07/30/2023 2:11 PM    Narrative  This result has an attachment that is not available.                                                   [facility]                                                 [facility]                                                  [address], [place].  [address]                                  Transthoracic Echocardiogram Report Name  JARVIS, KNODEL                     Study Date  08-21-2023                Height  66 in MRN  [id number]                                  Patient Location  [id number]             Weight  139 lb DOB  November 24, 1950                                Gender  Female                        BSA  1.7 m2 Age  72 yrs                                    Ethnicity  1                          BP  123-79 mmHg Reason For Study  Dyspnea on exertion                                                HR  79 Ordering Physician  KENNYTH, [place] REECE       Performed By  BONNETTA BOTTCHER Referring Physician  PARKER,  REECE - - PROCEDURE A two-dimensional transthoracic echocardiogram with color flow and Doppler was performed. Image Quality  Fair. - SUMMARY The left ventricular size is normal. LV ejection fraction = 55-60%. Left ventricular systolic function is normal. Left ventricular filling pattern is prolonged relaxation. The right ventricle is normal in size and function. The left atrium is mildly dilated. There is mild mitral valve prolapse. There is mild tricuspid regurgitation. Estimated right ventricular systolic pressure is 30  mmHg. IVC size was normal. There is no significant valvular stenosis or regurgitation. There is no pericardial effusion. - FINDINGS LEFT VENTRICLE The left ventricular size is normal. There is normal left ventricular wall thickness. LV ejection fraction = 55-60%. Left ventricular systolic function is normal. LV global longitudinal strain performed and reviewed but not reported due to suboptimal myocardial tracking. Left ventricular filling pattern is prolonged relaxation. The left ventricular wall motion is normal. - RIGHT VENTRICLE The right ventricle is normal in size and function. LEFT ATRIUM The left atrium is mildly dilated. RIGHT ATRIUM Right atrial size is normal. - AORTIC VALVE The aortic valve is normal in structure and function. There is trivial aortic valve thickening. There is no aortic stenosis. There is no aortic regurgitation. - MITRAL VALVE There is mild mitral valve thickening. There is trace mitral regurgitation. There is bileaflet mitral valve prolapse. There is mild mitral valve prolapse. - TRICUSPID VALVE Structurally normal tricuspid valve. There is mild  tricuspid regurgitation. Estimated right ventricular systolic pressure is 30 mmHg. - PULMONIC VALVE The pulmonic valve is not well visualized. Trace pulmonic valvular regurgitation. - ARTERIES The aortic sinus is normal size. The ascending aorta is normal size. - VENOUS Pulmonary venous flow pattern is normal. IVC size was normal. - EFFUSION There is no pericardial effusion. - - MMode-2D Measurements & Calculations IVSd  0.93 cm          LA diam  2.9 cm                 Ao sinus diam  2.8 cm LVOT diam  2.0 cm LVIDd  3.8 cm LVPWd  0.88 cm LVIDs  2.5 cm           _________________________________________________________________________________ IVC 1  1.4 cm          LA Vol Indexed  MOD   26.6 ml-m2LAVol MOD-bp   45.6 mlLAVol MOD-sp2   61.0 ml            _________________________________________________________________________________ LAVol MOD-sp4   33.2 mlRA area A4  14.0 cm2            TAPSE  2.5 cm Doppler Measurements & Calculations MV E max vel                                  MV P1-2t max vel  49.1 cm-sec SV LVOT   62.8 ml 48.3 cm-sec             MVA P1-2t   3.5 cm2   MV P1-2t  62.2 msec           Ao V2 max  115.6 cm-sec MV A max vel                                                                Ao max PG  5.3 mmHg 61.3 cm-sec                                                                 Ao V2 mean  70.3 cm-sec MV E-A  0.79                                                                Ao mean PG  2.3 mmHg Med Peak E  Vel                                                             Ao V2 VTI  19.5 cm 6.7 cm-sec Lat Peak E  Vel  AVA  VTI   3.2 cm2 8.8 cm-sec E-Lat E`  5.5 E-Med E`  7.2           _________________________________________________________________________________ LV V1 VTI  20.5 cm      TR max vel            AS Dimensionless Index  VTI   AVAi VTI  cm^2-m^2                         221.7 cm-sec          1.1                         TR max PG  19.8 mmHg                                1.9 cm2           _________________________________________________________________________________ SV index LVOT   36.7 ml-m2 ______________________________________________________________________________ Reading Physician                   [place], [id number] 08-21-2023 11 21 AM Procedure Note  Skiff Fairy, MD - 08/21/2023 Formatting of this note might be different from the original.                                                   [facility]                                                 [facility]                                                   [facility]  [place]                                                 [place], [place].                                                    [address]                                  Transthoracic Echocardiogram Report Name  DARNELLA, ZEITER                     Study Date  08-21-2023                Height  66 in MRN  [id number]                                  Patient Location  [id number]             Weight  139 lb DOB  04-21-1951                                Gender  Female                        BSA  1.7 m2 Age  88 yrs                                    Ethnicity  1                          BP  123-79 mmHg Reason For Study  Dyspnea on exertion                                                HR  79 Ordering Physician  KENNYTH, [place] REECE       Performed By  BONNETTA BOTTCHER Referring Physician  PARKER, [place] REECE - - PROCEDURE A two-dimensional transthoracic echocardiogram with color flow and Doppler was performed. Image Quality  Fair. - SUMMARY The left ventricular size is normal. LV ejection fraction = 55-60%. Left ventricular systolic function is normal. Left ventricular filling pattern is prolonged relaxation. The right ventricle is normal in size and function. The left atrium is mildly dilated. There is mild mitral valve prolapse. There is mild tricuspid regurgitation. Estimated right ventricular systolic pressure is 30 mmHg. IVC size was normal. There is no significant valvular stenosis or regurgitation. There is no pericardial effusion. - FINDINGS LEFT VENTRICLE The left ventricular size is normal. There is normal left ventricular wall thickness. LV ejection fraction = 55-60%. Left ventricular systolic function is normal. LV global longitudinal strain performed and reviewed but not reported due to suboptimal myocardial  tracking. Left ventricular filling  pattern is prolonged relaxation. The left ventricular wall motion is normal. - RIGHT VENTRICLE The right ventricle is normal in size and function. LEFT ATRIUM The left atrium is mildly dilated. RIGHT ATRIUM Right atrial size is normal. - AORTIC VALVE The aortic valve is normal in structure and function. There is trivial aortic valve thickening. There is no aortic stenosis. There is no aortic regurgitation. - MITRAL VALVE There is mild mitral valve thickening. There is trace mitral regurgitation. There is bileaflet mitral valve prolapse. There is mild mitral valve prolapse. - TRICUSPID VALVE Structurally normal tricuspid valve. There is mild tricuspid regurgitation. Estimated right ventricular systolic pressure is 30 mmHg. - PULMONIC VALVE The pulmonic valve is not well visualized. Trace pulmonic valvular regurgitation. - ARTERIES The aortic sinus is normal size. The ascending aorta is normal size. - VENOUS Pulmonary venous flow pattern is normal. IVC size was normal. - EFFUSION There is no pericardial effusion. - - MMode-2D Measurements & Calculations IVSd  0.93 cm          LA diam  2.9 cm                 Ao sinus diam  2.8 cm LVOT diam  2.0 cm LVIDd  3.8 cm LVPWd  0.88 cm LVIDs  2.5 cm _________________________________________________________________________________ IVC 1  1.4 cm          LA Vol Indexed  MOD   26.6 ml-m2LAVol MOD-bp   45.6 mlLAVol MOD-sp2   61.0 ml _________________________________________________________________________________ LAVol MOD-sp4   33.2 mlRA area A4  14.0 cm2            TAPSE  2.5 cm Doppler Measurements & Calculations MV E max vel                                  MV P1-2t max vel  49.1 cm-sec SV LVOT   62.8 ml 48.3 cm-sec             MVA P1-2t   3.5 cm2   MV P1-2t  62.2 msec           Ao V2 max  115.6 cm-sec MV A max vel                                                                Ao max PG  5.3  mmHg 61.3 cm-sec                                                                 Ao V2 mean  70.3 cm-sec MV E-A  0.79                                                                Ao mean PG  2.3 mmHg Med  Peak E  Vel                                                             Ao V2 VTI  19.5 cm 6.7 cm-sec Lat Peak E  Vel                                                             AVA  VTI   3.2 cm2 8.8 cm-sec E-Lat E`  5.5 E-Med E`  7.2 _________________________________________________________________________________ LV V1 VTI  20.5 cm      TR max vel            AS Dimensionless Index  VTI   AVAi VTI  cm^2-m^2                         221.7 cm-sec          1.1                         TR max PG  19.8 mmHg                                1.9 cm2 _________________________________________________________________________________ SV index LVOT   36.7 ml-m2 ______________________________________________________________________________ Reading Physician                   [place], [id number] 08-21-2023 11 21 AM   Impression:  She has a stable ascending aortic aneurysm that was measured at 3.2 cm on her current report from [facility].  I have personally reviewed the CTA and the measurement that I got was 3.2 cm in the coronal plane and 3.7 cm in the axial plane in the mid ascending aorta.  It was measured at 4.1 cm previously.  Her aorta appears stable and is still well below the surgical threshold.  I reviewed the CT images with her and answered all of her questions.  I stressed the importance of continued good blood pressure control in preventing further enlargement and acute aortic dissection.  She is scheduled to have a repeat chest CT for follow-up of her metastatic thyroid  carcinoma in 1 year and will return to see me after that to review the study.  Plan:  She will return to see me in about 1 year after her CT scan of the chest is done at [facility].  I spent 15 minutes performing this  established patient evaluation and > 50% of this time was spent face to face counseling and coordinating the care of this patient's aortic aneurysm.    Dorise MARLA Fellers, MD [facility] [phone]

## 2023-12-12 NOTE — Progress Notes (Unsigned)
      Subjective:    Patient ID: Patricia Conley, female    DOB: [birth year], 73 y.o.   MRN: [id number]     HPI Patricia Conley is here for follow up of her chronic medical problems.  10/2023:   spine -0.9, RFN -2.4, left 1/3 radius -2.1, left forearm total -2.9         FRAX 13%, 3.3%   11/15/2021: spine -1.5,  RFN -2.4, Left 1/3 radius -1.8,         13%, 2.9%  Previous treatment - Fosamax  for 2 years, actonel for 1 year, fosamax  restarted 2017 - 2022     Medications and allergies reviewed with patient and updated if appropriate.  Current Outpatient Medications on File Prior to Visit  Medication Sig Dispense Refill   amitriptyline  (ELAVIL ) 10 MG tablet Take 1 tablet (10 mg total) by mouth at bedtime. (Patient taking differently: Take 10 mg by mouth at bedtime as needed.) 90 tablet 3   ASPIRIN  81 PO Take by mouth daily.     estradiol (ESTRACE) 0.1 MG/GM vaginal cream      fluticasone  (FLONASE ) 50 MCG/ACT nasal spray Place 2 sprays into both nostrils 2 (two) times daily.     levothyroxine (SYNTHROID) 100 MCG tablet Take 100 mcg by mouth daily before breakfast. (Patient taking differently: Take 100 mcg by mouth daily before breakfast. Takes 94 mcg's per day)     loratadine (CLARITIN) 10 MG tablet Take 10 mg by mouth daily as needed for allergies (PRN per patient). As needed per patient     MAGNESIUM GLYCINATE PO Take 3 each by mouth at bedtime.     mupirocin ointment (BACTROBAN) 2 % Apply 1 Application topically 2 (two) times daily. Left maxillary sinus     valACYclovir  (VALTREX ) 500 MG tablet Take 1 tablet (500 mg total) by mouth daily. 90 tablet 1   No current facility-administered medications on file prior to visit.     Review of Systems     Objective:  There were no vitals filed for this visit. BP Readings from Last 3 Encounters:  11/28/23 115/79  09/24/23 110/64  09/14/23 108/74   Wt Readings from Last 3 Encounters:  11/28/23 139 lb (63 kg)  09/24/23 139 lb 12.8 oz (63.4  kg)  09/14/23 138 lb (62.6 kg)   There is no height or weight on file to calculate BMI.    Physical Exam     Lab Results  Component Value Date   WBC 6.0 07/09/2023   HGB 14.2 07/09/2023   HCT 42.2 07/09/2023   PLT 250.0 07/09/2023   GLUCOSE 90 07/09/2023   CHOL 170 04/12/2021   TRIG 57.0 04/12/2021   HDL 63.10 04/12/2021   LDLCALC 96 04/12/2021   ALT 13 07/09/2023   AST 18 07/09/2023   NA 141 07/09/2023   K 4.7 07/09/2023   CL 105 07/09/2023   CREATININE 0.79 07/09/2023   BUN 21 07/09/2023   CO2 28 07/09/2023   TSH 0.16 (L) 07/09/2023   INR 0.98 11/20/2012   HGBA1C 5.8 03/26/2023     Assessment & Plan:    See Problem List for Assessment and Plan of chronic medical problems.

## 2023-12-13 ENCOUNTER — Ambulatory Visit: Admitting: Internal Medicine

## 2023-12-13 VITALS — BP 118/80 | HR 72 | Temp 97.9°F | Ht 66.0 in | Wt 138.5 lb

## 2023-12-13 DIAGNOSIS — M810 Age-related osteoporosis without current pathological fracture: Secondary | ICD-10-CM | POA: Diagnosis not present

## 2023-12-13 NOTE — Patient Instructions (Addendum)
   Stop bone breakdown Fosamax  Reclast - infusion yearly Prolia - injection in the office every 6 months   Bone buildup Evenity - injection monthly for 12 months Forteo - you do daily injections at home for 2 years

## 2023-12-13 NOTE — Assessment & Plan Note (Addendum)
 Chronic Previous treatment includes Fosamax  for 2 years, Actonel for 1 year and Fosamax  again for 5 years Reviewed her most recent 2 bone density scans She is taking 500 mg of calcium and supplementation daily-stressed high calcium diet-discussed that she is gena need to make an effort to get enough calcium in her diet on a daily basis-goal 1200 mg total Continue vitamin D  supplementation Stressed regular exercise-she will get back to her regular exercise once able-walking, resistance training Discussed several medications including Fosamax , Reclast, Prolia, Evenity and Forteo-discussed medications that stop bone breakdown versus build bone up Discussed some side effects She will be seen her endocrinologist next month most likely and advised that she also discussed with them to get their opinion

## 2024-01-09 ENCOUNTER — Encounter: Payer: Self-pay | Admitting: Internal Medicine

## 2024-01-09 NOTE — Patient Instructions (Addendum)
        See your chiropractor and let me know.     Return if symptoms worsen or fail to improve.

## 2024-01-09 NOTE — Progress Notes (Unsigned)
    Subjective:    Patient ID: Patricia Conley, female    DOB: February 03, 1951, 73 y.o.   MRN: [id number]      HPI Patricia Conley is here for No chief complaint on file.        Medications and allergies reviewed with patient and updated if appropriate.  Current Outpatient Medications on File Prior to Visit  Medication Sig Dispense Refill   amitriptyline  (ELAVIL ) 10 MG tablet Take 1 tablet (10 mg total) by mouth at bedtime. (Patient taking differently: Take 10 mg by mouth at bedtime as needed.) 90 tablet 3   ASPIRIN  81 PO Take by mouth daily.     estradiol (ESTRACE) 0.1 MG/GM vaginal cream      fluticasone  (FLONASE ) 50 MCG/ACT nasal spray Place 2 sprays into both nostrils 2 (two) times daily.     levothyroxine (SYNTHROID) 100 MCG tablet Take 100 mcg by mouth daily before breakfast. (Patient taking differently: Take 100 mcg by mouth daily before breakfast. Takes 94 mcg's per day)     loratadine (CLARITIN) 10 MG tablet Take 10 mg by mouth daily as needed for allergies (PRN per patient). As needed per patient     MAGNESIUM GLYCINATE PO Take 3 each by mouth at bedtime.     mupirocin ointment (BACTROBAN) 2 % Apply 1 Application topically 2 (two) times daily. Left maxillary sinus     valACYclovir  (VALTREX ) 500 MG tablet Take 1 tablet (500 mg total) by mouth daily. 90 tablet 1   No current facility-administered medications on file prior to visit.    Review of Systems     Objective:  There were no vitals filed for this visit. BP Readings from Last 3 Encounters:  12/13/23 118/80  11/28/23 115/79  09/24/23 110/64   Wt Readings from Last 3 Encounters:  12/13/23 138 lb 8 oz (62.8 kg)  11/28/23 139 lb (63 kg)  09/24/23 139 lb 12.8 oz (63.4 kg)   There is no height or weight on file to calculate BMI.    Physical Exam Constitutional:      General: She is not in acute distress.    Appearance: Normal appearance. She is not ill-appearing.  HENT:     Head: Normocephalic and atraumatic.      Right Ear: Tympanic membrane, ear canal and external ear normal.     Left Ear: Tympanic membrane, ear canal and external ear normal.     Mouth/Throat:     Mouth: Mucous membranes are moist.     Pharynx: No oropharyngeal exudate or posterior oropharyngeal erythema.  Eyes:     Conjunctiva/sclera: Conjunctivae normal.  Cardiovascular:     Rate and Rhythm: Normal rate and regular rhythm.  Pulmonary:     Effort: Pulmonary effort is normal. No respiratory distress.     Breath sounds: Normal breath sounds. No wheezing or rales.  Musculoskeletal:     Cervical back: Neck supple. No tenderness.  Lymphadenopathy:     Cervical: No cervical adenopathy.  Skin:    General: Skin is warm and dry.  Neurological:     Mental Status: She is alert.            Assessment & Plan:    See Problem List for Assessment and Plan of chronic medical problems.

## 2024-01-10 ENCOUNTER — Ambulatory Visit: Payer: Self-pay | Admitting: Internal Medicine

## 2024-01-10 ENCOUNTER — Ambulatory Visit: Admitting: Internal Medicine

## 2024-01-10 VITALS — BP 116/72 | HR 78 | Temp 98.2°F | Ht 66.0 in | Wt 136.0 lb

## 2024-01-10 DIAGNOSIS — R5383 Other fatigue: Secondary | ICD-10-CM

## 2024-01-10 DIAGNOSIS — R519 Headache, unspecified: Secondary | ICD-10-CM

## 2024-01-10 DIAGNOSIS — M2550 Pain in unspecified joint: Secondary | ICD-10-CM

## 2024-01-10 LAB — CBC WITH DIFFERENTIAL/PLATELET
Basophils Absolute: 0 K/uL (ref 0.0–0.1)
Basophils Relative: 0.9 % (ref 0.0–3.0)
Eosinophils Absolute: 0.1 K/uL (ref 0.0–0.7)
Eosinophils Relative: 2.4 % (ref 0.0–5.0)
HCT: 41.8 % (ref 36.0–46.0)
Hemoglobin: 13.8 g/dL (ref 12.0–15.0)
Lymphocytes Relative: 27.5 % (ref 12.0–46.0)
Lymphs Abs: 1.2 K/uL (ref 0.7–4.0)
MCHC: 33 g/dL (ref 30.0–36.0)
MCV: 87.5 fl (ref 78.0–100.0)
Monocytes Absolute: 0.3 K/uL (ref 0.1–1.0)
Monocytes Relative: 7.9 % (ref 3.0–12.0)
Neutro Abs: 2.7 K/uL (ref 1.4–7.7)
Neutrophils Relative %: 61.3 % (ref 43.0–77.0)
Platelets: 237 K/uL (ref 150.0–400.0)
RBC: 4.78 Mil/uL (ref 3.87–5.11)
RDW: 13.3 % (ref 11.5–15.5)
WBC: 4.3 K/uL (ref 4.0–10.5)

## 2024-01-10 LAB — COMPREHENSIVE METABOLIC PANEL WITH GFR
ALT: 12 U/L (ref 0–35)
AST: 18 U/L (ref 0–37)
Albumin: 4.7 g/dL (ref 3.5–5.2)
Alkaline Phosphatase: 53 U/L (ref 39–117)
BUN: 14 mg/dL (ref 6–23)
CO2: 30 meq/L (ref 19–32)
Calcium: 9.1 mg/dL (ref 8.4–10.5)
Chloride: 104 meq/L (ref 96–112)
Creatinine, Ser: 0.73 mg/dL (ref 0.40–1.20)
GFR: 83.05 mL/min (ref >60.00)
Glucose, Bld: 61 mg/dL — ABNORMAL LOW (ref 70–99)
Potassium: 4.5 meq/L (ref 3.5–5.1)
Sodium: 140 meq/L (ref 135–145)
Total Bilirubin: 0.8 mg/dL (ref 0.2–1.2)
Total Protein: 6.9 g/dL (ref 6.0–8.3)

## 2024-01-14 LAB — B. BURGDORFI ANTIBODIES BY WB

## 2024-02-08 ENCOUNTER — Ambulatory Visit: Payer: Self-pay

## 2024-02-08 ENCOUNTER — Ambulatory Visit: Payer: Self-pay | Admitting: Internal Medicine

## 2024-02-08 NOTE — Telephone Encounter (Signed)
 This RN made second attempt to contact patient with no answer. A vm was left with call back number provided.

## 2024-02-08 NOTE — Telephone Encounter (Signed)
 This RN made first attempt to contact patient with no answer. A vm was left with call back number provided.    Copied from CRM [id number]. Topic: Clinical - Medical Advice >> Feb 08, 2024  2:24 PM Alfonso ORN wrote: Reason for CRM: pt wants confirmation on if she can have her flu shot and rsv shot done during the same appointment. Please call pt back to advise

## 2024-02-08 NOTE — Telephone Encounter (Signed)
 This RN made third attempt to contact patient with no answer

## 2024-02-08 NOTE — Telephone Encounter (Signed)
 FYI Only or Action Required?: Action required by provider: clinical question for provider.  Patient was last seen in primary care on 01/10/2024 by Geofm Glade PARAS, MD.  Called Nurse Triage reporting Advice Only.  Symptoms began information call only.  Interventions attempted: Other: information call only.  Symptoms are: information call only.  Triage Disposition: Call PCP When Office is Open  Patient/caregiver understands and will follow disposition?: Yes Reason for Disposition  [1] Caller requesting NON-URGENT health information AND [2] PCP's office is the best resource  Answer Assessment - Initial Assessment Questions 1. REASON FOR CALL: What is the main reason for your call? or How can I best help you?     Patient calling back to ask if she can receive flu and RSV vaccines at the same time. Per [facility], RSV vaccine is not administered at this clinic. Patient would like to know whether or not RSV vaccine is recommend and where she can go to receive it. Please advise.  Protocols used: Information Only Call - No Triage-A-AH

## 2024-02-12 NOTE — Telephone Encounter (Signed)
 I would recommend getting RSV vaccine.  I would recommend getting it without any other vaccines just because of possible side effects.

## 2024-02-14 ENCOUNTER — Ambulatory Visit (INDEPENDENT_AMBULATORY_CARE_PROVIDER_SITE_OTHER)

## 2024-02-14 DIAGNOSIS — Z23 Encounter for immunization: Secondary | ICD-10-CM | POA: Diagnosis not present

## 2024-02-14 NOTE — Progress Notes (Signed)
 After obtaining consent, and per orders of Dr. Geofm, injection of High Dose Flu given by Edsel CHRISTELLA Kerns. Patient tolerated well.

## 2024-02-18 ENCOUNTER — Ambulatory Visit

## 2024-03-03 ENCOUNTER — Ambulatory Visit

## 2024-04-10 ENCOUNTER — Encounter: Payer: Self-pay | Admitting: Internal Medicine

## 2024-04-10 NOTE — Progress Notes (Unsigned)
 Subjective:    Patient ID: Patricia Conley, female    DOB: [birth year], 73 y.o.   MRN: [id number]     HPI Chanler is here for follow up of her chronic medical problems.    Medications and allergies reviewed with patient and updated if appropriate.  Medications Ordered Prior to Encounter[1]   Review of Systems  Constitutional:  Negative for fever.  Respiratory:  Negative for cough, shortness of breath and wheezing.   Cardiovascular:  Negative for chest pain, palpitations and leg swelling.  Neurological:  Negative for light-headedness and headaches.       Objective:  There were no vitals filed for this visit. BP Readings from Last 3 Encounters:  01/10/24 116/72  12/13/23 118/80  11/28/23 115/79   Wt Readings from Last 3 Encounters:  01/10/24 136 lb (61.7 kg)  12/13/23 138 lb 8 oz (62.8 kg)  11/28/23 139 lb (63 kg)   There is no height or weight on file to calculate BMI.    Physical Exam Constitutional:      General: She is not in acute distress.    Appearance: Normal appearance.  HENT:     Head: Normocephalic and atraumatic.  Eyes:     Conjunctiva/sclera: Conjunctivae normal.  Cardiovascular:     Rate and Rhythm: Normal rate and regular rhythm.     Heart sounds: Normal heart sounds.  Pulmonary:     Effort: Pulmonary effort is normal. No respiratory distress.     Breath sounds: Normal breath sounds. No wheezing.  Musculoskeletal:     Cervical back: Neck supple.     Right lower leg: No edema.     Left lower leg: No edema.  Lymphadenopathy:     Cervical: No cervical adenopathy.  Skin:    General: Skin is warm and dry.     Findings: No rash.  Neurological:     Mental Status: She is alert. Mental status is at baseline.  Psychiatric:        Mood and Affect: Mood normal.        Behavior: Behavior normal.        Lab Results  Component Value Date   WBC 4.3 01/10/2024   HGB 13.8 01/10/2024   HCT 41.8 01/10/2024   PLT 237.0 01/10/2024    GLUCOSE 61 (L) 01/10/2024   CHOL 170 04/12/2021   TRIG 57.0 04/12/2021   HDL 63.10 04/12/2021   LDLCALC 96 04/12/2021   ALT 12 01/10/2024   AST 18 01/10/2024   NA 140 01/10/2024   K 4.5 01/10/2024   CL 104 01/10/2024   CREATININE 0.73 01/10/2024   BUN 14 01/10/2024   CO2 30 01/10/2024   TSH 0.16 (L) 07/09/2023   INR 0.98 11/20/2012   HGBA1C 5.8 03/26/2023     Assessment & Plan:    See Problem List for Assessment and Plan of chronic medical problems.        [1]  Current Outpatient Medications on File Prior to Visit  Medication Sig Dispense Refill   amitriptyline  (ELAVIL ) 10 MG tablet Take 1 tablet (10 mg total) by mouth at bedtime. (Patient taking differently: Take 10 mg by mouth at bedtime as needed.) 90 tablet 3   ASPIRIN  81 PO Take by mouth daily.     estradiol (ESTRACE) 0.1 MG/GM vaginal cream      fluticasone  (FLONASE ) 50 MCG/ACT nasal spray Place 2 sprays into both nostrils 2 (two) times daily.     levothyroxine (SYNTHROID) 100 MCG  tablet Take 100 mcg by mouth daily before breakfast. (Patient taking differently: Take 100 mcg by mouth daily before breakfast. Takes 94 mcg's per day)     loratadine (CLARITIN) 10 MG tablet Take 10 mg by mouth daily as needed for allergies (PRN per patient). As needed per patient     MAGNESIUM GLYCINATE PO Take 3 each by mouth at bedtime.     mupirocin ointment (BACTROBAN) 2 % Apply 1 Application topically 2 (two) times daily. Left maxillary sinus     valACYclovir  (VALTREX ) 500 MG tablet Take 1 tablet (500 mg total) by mouth daily. 90 tablet 1   No current facility-administered medications on file prior to visit.

## 2024-04-11 ENCOUNTER — Ambulatory Visit (INDEPENDENT_AMBULATORY_CARE_PROVIDER_SITE_OTHER): Admitting: Internal Medicine

## 2024-04-11 ENCOUNTER — Ambulatory Visit: Payer: Self-pay | Admitting: Internal Medicine

## 2024-04-11 VITALS — BP 116/74 | HR 84 | Temp 98.1°F | Resp 17 | Ht 66.0 in | Wt 143.0 lb

## 2024-04-11 DIAGNOSIS — M79662 Pain in left lower leg: Secondary | ICD-10-CM | POA: Diagnosis not present

## 2024-04-11 DIAGNOSIS — R7303 Prediabetes: Secondary | ICD-10-CM

## 2024-04-11 DIAGNOSIS — R208 Other disturbances of skin sensation: Secondary | ICD-10-CM

## 2024-04-11 DIAGNOSIS — E890 Postprocedural hypothyroidism: Secondary | ICD-10-CM | POA: Diagnosis not present

## 2024-04-11 DIAGNOSIS — A6004 Herpesviral vulvovaginitis: Secondary | ICD-10-CM

## 2024-04-11 DIAGNOSIS — M810 Age-related osteoporosis without current pathological fracture: Secondary | ICD-10-CM | POA: Diagnosis not present

## 2024-04-11 LAB — VITAMIN D 25 HYDROXY (VIT D DEFICIENCY, FRACTURES): VITD: 28.05 ng/mL — ABNORMAL LOW (ref 30.00–100.00)

## 2024-04-11 LAB — LIPID PANEL
Cholesterol: 150 mg/dL (ref 28–200)
HDL: 63.4 mg/dL
LDL Cholesterol: 73 mg/dL (ref 10–99)
NonHDL: 86.27
Total CHOL/HDL Ratio: 2
Triglycerides: 66 mg/dL (ref 10.0–149.0)
VLDL: 13.2 mg/dL (ref 0.0–40.0)

## 2024-04-11 LAB — CBC WITH DIFFERENTIAL/PLATELET
Basophils Absolute: 0 K/uL (ref 0.0–0.1)
Basophils Relative: 1 % (ref 0.0–3.0)
Eosinophils Absolute: 0.1 K/uL (ref 0.0–0.7)
Eosinophils Relative: 2 % (ref 0.0–5.0)
HCT: 39.9 % (ref 36.0–46.0)
Hemoglobin: 13.5 g/dL (ref 12.0–15.0)
Lymphocytes Relative: 31.7 % (ref 12.0–46.0)
Lymphs Abs: 1.5 K/uL (ref 0.7–4.0)
MCHC: 33.8 g/dL (ref 30.0–36.0)
MCV: 87.2 fl (ref 78.0–100.0)
Monocytes Absolute: 0.4 K/uL (ref 0.1–1.0)
Monocytes Relative: 8 % (ref 3.0–12.0)
Neutro Abs: 2.8 K/uL (ref 1.4–7.7)
Neutrophils Relative %: 57.3 % (ref 43.0–77.0)
Platelets: 221 K/uL (ref 150.0–400.0)
RBC: 4.57 Mil/uL (ref 3.87–5.11)
RDW: 13.7 % (ref 11.5–15.5)
WBC: 4.9 K/uL (ref 4.0–10.5)

## 2024-04-11 LAB — COMPREHENSIVE METABOLIC PANEL WITH GFR
ALT: 13 U/L (ref 3–35)
AST: 22 U/L (ref 5–37)
Albumin: 4.6 g/dL (ref 3.5–5.2)
Alkaline Phosphatase: 56 U/L (ref 39–117)
BUN: 16 mg/dL (ref 6–23)
CO2: 27 meq/L (ref 19–32)
Calcium: 9.2 mg/dL (ref 8.4–10.5)
Chloride: 104 meq/L (ref 96–112)
Creatinine, Ser: 0.78 mg/dL (ref 0.40–1.20)
GFR: 76.57 mL/min
Glucose, Bld: 81 mg/dL (ref 70–99)
Potassium: 4.2 meq/L (ref 3.5–5.1)
Sodium: 141 meq/L (ref 135–145)
Total Bilirubin: 0.9 mg/dL (ref 0.2–1.2)
Total Protein: 6.9 g/dL (ref 6.0–8.3)

## 2024-04-11 LAB — HEMOGLOBIN A1C: Hgb A1c MFr Bld: 5.8 % (ref 4.6–6.5)

## 2024-04-11 LAB — TSH: TSH: 0.31 u[IU]/mL — ABNORMAL LOW (ref 0.35–5.50)

## 2024-04-11 LAB — T4, FREE: Free T4: 1.11 ng/dL (ref 0.60–1.60)

## 2024-04-11 MED ORDER — VALACYCLOVIR HCL 500 MG PO TABS: 500.0000 mg | ORAL_TABLET | Freq: Every day | ORAL | 3 refills | Status: AC

## 2024-04-11 NOTE — Assessment & Plan Note (Signed)
 New Likely lumbar radiculopathy Can try some back exercises, avoiding certain activities, ice, heat Can consider physical therapy If not improving would recommend seeing orthopedic

## 2024-04-11 NOTE — Assessment & Plan Note (Signed)
Chronic Continue valtrex 500 mg daily

## 2024-04-11 NOTE — Assessment & Plan Note (Signed)
 Chronic Lab Results  Component Value Date   HGBA1C 5.8 04/11/2024   Check a1c Low sugar / carb diet Stressed regular exercise

## 2024-04-11 NOTE — Assessment & Plan Note (Signed)
 Chronic History of papillary thyroid  carcinoma s/p surgery Following with endocrine and he is monitoring her thyroid  function and medication She sees him in a couple weeks Has been experiencing increased palpitations and not feeling great so we will check TSH, free T4 today Continue thyroid  dose per endocrine

## 2024-04-11 NOTE — Assessment & Plan Note (Signed)
 Chronic Previous treatment includes Fosamax  for 2 years, Actonel for 1 year and Fosamax  again for 5 years Not currently taking calcium or vitamin D -advised taking 500 mg of calcium daily and eating a high calcium diet.  Needs vitamin D  supplementation daily Check vitamin D  level Stressed regular exercise Discussed several medications at her last visit and side effects in the past-she is still deciding on what she would like to do

## 2024-04-11 NOTE — Assessment & Plan Note (Addendum)
 Chronic Related to allergies, stress, environmental factors  Increased symptoms this past year Was off amitriptyline  for a while, but now taking nightly Continue Elavil  10 mg at bedtime

## 2024-04-11 NOTE — Patient Instructions (Addendum)
" ° ° ° ° °  Blood work was ordered.       Medications changes include :   None      Return in about 1 year (around 04/11/2025) for Physical Exam.  "

## 2024-04-23 ENCOUNTER — Ambulatory Visit

## 2024-05-28 LAB — HM MAMMOGRAPHY

## 2024-05-30 ENCOUNTER — Ambulatory Visit: Payer: Self-pay | Admitting: *Deleted

## 2024-05-30 ENCOUNTER — Encounter: Payer: Self-pay | Admitting: Internal Medicine

## 2024-05-30 NOTE — Telephone Encounter (Signed)
" ° ° °  FYI Only or Action Required?: FYI only for provider: appointment scheduled on 06/02/24.  Patient was last seen in primary care on 04/11/2024 by Geofm Glade PARAS, MD.  Called Nurse Triage reporting Stool Color Change.  Symptoms began several weeks ago.  Interventions attempted: Nothing.  Symptoms are: gradually worsening.  Triage Disposition: See PCP When Office is Open (Within 3 Days)  Patient/caregiver understands and will follow disposition?: Yes     Reports stool color orange and denies any blood in stool.               Reason for Disposition  [1] Abnormal color is unexplained AND [2] persists > 24 hours  Answer Assessment - Initial Assessment Questions Appt scheduled for 06/02/24. Recommended increase walking , staying hydrated. Montior sx and report to provider at [facility].        1. COLOR: What color is your stool? Is that color in part or all of the stool? (e.g., black, clay-colored, green, red)      orange 2. ONSET: When did you first notice this color change?     3 weeks ago  3. CAUSE: Have you eaten any food or taken any medicine of this color? Note: See listing in Background Information section.      Yes sweet potatoes and reports she has been eating a sweet potato daily for years 4. OTHER SYMPTOMS: Do you have any other symptoms? (e.g., abdomen pain, diarrhea, fever, yellow eyes or skin).     Stool color orange and does eat a lot of sweet potatoes and always eats a sweet potato, never changed color of stool before.  Last few days GERDS sx more noticeable. Consistency of BM changed. Tackey or stickey. No chest pain no difficulty breathing no fever no abdominal pain . Denies constipation.  Protocols used: Stools - Unusual Color-A-AH  "

## 2024-06-02 ENCOUNTER — Ambulatory Visit: Admitting: Internal Medicine

## 2024-06-24 ENCOUNTER — Ambulatory Visit: Admitting: Nurse Practitioner

## 2024-08-01 ENCOUNTER — Ambulatory Visit
